# Patient Record
Sex: Male | Born: 1935 | Race: White | Hispanic: No | Marital: Married | Smoking: Never smoker
Health system: Southern US, Community
[De-identification: ages and names within clinical notes are randomized; demographics above are authoritative.]

## PROBLEM LIST (undated history)

## (undated) DIAGNOSIS — K469 Unspecified abdominal hernia without obstruction or gangrene: Secondary | ICD-10-CM

## (undated) DIAGNOSIS — C801 Malignant (primary) neoplasm, unspecified: Secondary | ICD-10-CM

## (undated) HISTORY — DX: Unspecified abdominal hernia without obstruction or gangrene: K46.9

## (undated) HISTORY — PX: CATARACT EXTRACTION, BILATERAL: SHX1313

## (undated) HISTORY — PX: HERNIA REPAIR: SHX51

## (undated) HISTORY — PX: MOHS SURGERY: SUR867

## (undated) HISTORY — DX: Malignant (primary) neoplasm, unspecified: C80.1

---

## 1985-10-12 DIAGNOSIS — K469 Unspecified abdominal hernia without obstruction or gangrene: Secondary | ICD-10-CM

## 1985-10-12 HISTORY — DX: Unspecified abdominal hernia without obstruction or gangrene: K46.9

## 1999-09-19 ENCOUNTER — Encounter: Admission: RE | Admit: 1999-09-19 | Discharge: 1999-09-19 | Payer: Self-pay | Admitting: Family Medicine

## 1999-09-19 ENCOUNTER — Encounter: Payer: Self-pay | Admitting: Family Medicine

## 2004-11-21 ENCOUNTER — Ambulatory Visit: Payer: Self-pay | Admitting: Family Medicine

## 2005-01-15 ENCOUNTER — Ambulatory Visit: Payer: Self-pay | Admitting: Family Medicine

## 2006-09-11 ENCOUNTER — Ambulatory Visit: Payer: Self-pay | Admitting: Family Medicine

## 2006-10-28 ENCOUNTER — Ambulatory Visit: Payer: Self-pay | Admitting: Gastroenterology

## 2006-11-11 ENCOUNTER — Ambulatory Visit: Payer: Self-pay | Admitting: Gastroenterology

## 2006-12-02 ENCOUNTER — Ambulatory Visit: Payer: Self-pay | Admitting: Family Medicine

## 2006-12-02 LAB — CONVERTED CEMR LAB
ALT: 24 units/L (ref 0–40)
AST: 26 units/L (ref 0–37)
Basophils Relative: 0.1 % (ref 0.0–1.0)
Bilirubin, Direct: 0.1 mg/dL (ref 0.0–0.3)
CO2: 34 meq/L — ABNORMAL HIGH (ref 19–32)
Calcium: 9.5 mg/dL (ref 8.4–10.5)
Chloride: 103 meq/L (ref 96–112)
Eosinophils Absolute: 0.2 10*3/uL (ref 0.0–0.6)
Eosinophils Relative: 2.7 % (ref 0.0–5.0)
GFR calc non Af Amer: 88 mL/min
Glucose, Bld: 105 mg/dL — ABNORMAL HIGH (ref 70–99)
HDL: 45.1 mg/dL (ref >39.0)
Platelets: 197 10*3/uL (ref 150–400)
RBC: 4.95 M/uL (ref 4.22–5.81)
RDW: 12.3 % (ref 11.5–14.6)
TSH: 5.45 microintl units/mL (ref 0.35–5.50)
Total CHOL/HDL Ratio: 5.6
Triglycerides: 110 mg/dL (ref 0–149)
VLDL: 22 mg/dL (ref 0–40)
WBC: 7.4 10*3/uL (ref 4.5–10.5)

## 2007-03-14 ENCOUNTER — Ambulatory Visit: Payer: Self-pay | Admitting: Family Medicine

## 2007-03-14 LAB — CONVERTED CEMR LAB
Albumin: 3.7 g/dL (ref 3.5–5.2)
Bilirubin, Direct: 0.2 mg/dL (ref 0.0–0.3)
Cholesterol: 165 mg/dL (ref 0–200)
Total Bilirubin: 1.2 mg/dL (ref 0.3–1.2)
Total CHOL/HDL Ratio: 4.1
Total Protein: 6.4 g/dL (ref 6.0–8.3)
Triglycerides: 64 mg/dL (ref 0–149)

## 2007-05-04 DIAGNOSIS — Z85828 Personal history of other malignant neoplasm of skin: Secondary | ICD-10-CM

## 2007-07-26 ENCOUNTER — Ambulatory Visit: Payer: Self-pay | Admitting: Family Medicine

## 2007-07-26 DIAGNOSIS — M542 Cervicalgia: Secondary | ICD-10-CM

## 2007-08-17 ENCOUNTER — Encounter: Admission: RE | Admit: 2007-08-17 | Discharge: 2007-08-17 | Payer: Self-pay | Admitting: Family Medicine

## 2007-08-17 ENCOUNTER — Ambulatory Visit: Payer: Self-pay | Admitting: Family Medicine

## 2007-08-19 ENCOUNTER — Ambulatory Visit: Payer: Self-pay | Admitting: Family Medicine

## 2007-09-06 DIAGNOSIS — J157 Pneumonia due to Mycoplasma pneumoniae: Secondary | ICD-10-CM

## 2007-12-06 DIAGNOSIS — M5137 Other intervertebral disc degeneration, lumbosacral region: Secondary | ICD-10-CM

## 2007-12-08 ENCOUNTER — Ambulatory Visit: Payer: Self-pay | Admitting: Family Medicine

## 2007-12-12 ENCOUNTER — Emergency Department (HOSPITAL_COMMUNITY): Admission: EM | Admit: 2007-12-12 | Discharge: 2007-12-12 | Payer: Self-pay | Admitting: Emergency Medicine

## 2007-12-13 ENCOUNTER — Ambulatory Visit: Payer: Self-pay | Admitting: Family Medicine

## 2007-12-20 ENCOUNTER — Ambulatory Visit: Payer: Self-pay | Admitting: Family Medicine

## 2007-12-20 DIAGNOSIS — E785 Hyperlipidemia, unspecified: Secondary | ICD-10-CM

## 2008-01-28 ENCOUNTER — Ambulatory Visit: Payer: Self-pay | Admitting: Family Medicine

## 2008-01-28 DIAGNOSIS — IMO0002 Reserved for concepts with insufficient information to code with codable children: Secondary | ICD-10-CM

## 2008-01-31 ENCOUNTER — Ambulatory Visit: Payer: Self-pay | Admitting: Family Medicine

## 2008-03-09 ENCOUNTER — Telehealth: Payer: Self-pay | Admitting: *Deleted

## 2008-03-09 ENCOUNTER — Encounter: Payer: Self-pay | Admitting: *Deleted

## 2008-04-15 ENCOUNTER — Ambulatory Visit (HOSPITAL_COMMUNITY): Admission: EM | Admit: 2008-04-15 | Discharge: 2008-04-15 | Payer: Self-pay | Admitting: Emergency Medicine

## 2008-04-19 ENCOUNTER — Ambulatory Visit (HOSPITAL_COMMUNITY): Admission: RE | Admit: 2008-04-19 | Discharge: 2008-04-19 | Payer: Self-pay | Admitting: Urology

## 2008-06-11 ENCOUNTER — Ambulatory Visit (HOSPITAL_BASED_OUTPATIENT_CLINIC_OR_DEPARTMENT_OTHER): Admission: RE | Admit: 2008-06-11 | Discharge: 2008-06-11 | Payer: Self-pay | Admitting: Urology

## 2008-07-12 ENCOUNTER — Ambulatory Visit: Payer: Self-pay | Admitting: Family Medicine

## 2008-11-12 DIAGNOSIS — IMO0002 Reserved for concepts with insufficient information to code with codable children: Secondary | ICD-10-CM | POA: Insufficient documentation

## 2008-12-04 ENCOUNTER — Ambulatory Visit: Payer: Self-pay | Admitting: Family Medicine

## 2008-12-04 DIAGNOSIS — B351 Tinea unguium: Secondary | ICD-10-CM

## 2008-12-04 DIAGNOSIS — M719 Bursopathy, unspecified: Secondary | ICD-10-CM

## 2008-12-04 DIAGNOSIS — M67919 Unspecified disorder of synovium and tendon, unspecified shoulder: Secondary | ICD-10-CM | POA: Insufficient documentation

## 2009-01-08 ENCOUNTER — Ambulatory Visit: Payer: Self-pay | Admitting: Family Medicine

## 2009-01-08 DIAGNOSIS — J309 Allergic rhinitis, unspecified: Secondary | ICD-10-CM | POA: Insufficient documentation

## 2009-06-04 ENCOUNTER — Ambulatory Visit: Payer: Self-pay | Admitting: Family Medicine

## 2009-06-04 DIAGNOSIS — L255 Unspecified contact dermatitis due to plants, except food: Secondary | ICD-10-CM | POA: Insufficient documentation

## 2009-07-15 ENCOUNTER — Ambulatory Visit: Payer: Self-pay | Admitting: Family Medicine

## 2009-10-10 ENCOUNTER — Ambulatory Visit: Payer: Self-pay | Admitting: Family Medicine

## 2009-10-10 DIAGNOSIS — J069 Acute upper respiratory infection, unspecified: Secondary | ICD-10-CM | POA: Insufficient documentation

## 2010-09-08 ENCOUNTER — Ambulatory Visit: Payer: Self-pay | Admitting: Family Medicine

## 2010-09-08 DIAGNOSIS — R55 Syncope and collapse: Secondary | ICD-10-CM | POA: Insufficient documentation

## 2010-09-24 ENCOUNTER — Ambulatory Visit: Payer: Self-pay | Admitting: Family Medicine

## 2010-09-24 ENCOUNTER — Encounter: Payer: Self-pay | Admitting: Family Medicine

## 2010-09-24 LAB — CONVERTED CEMR LAB
Bilirubin Urine: NEGATIVE
Blood in Urine, dipstick: NEGATIVE
Glucose, Urine, Semiquant: NEGATIVE
Urobilinogen, UA: 0.2
WBC Urine, dipstick: NEGATIVE
pH: 5

## 2010-09-25 LAB — CONVERTED CEMR LAB
AST: 26 units/L (ref 0–37)
Alkaline Phosphatase: 78 units/L (ref 39–117)
BUN: 19 mg/dL (ref 6–23)
Basophils Absolute: 0 10*3/uL (ref 0.0–0.1)
Calcium: 9.3 mg/dL (ref 8.4–10.5)
Cholesterol: 162 mg/dL (ref 0–200)
Creatinine, Ser: 0.9 mg/dL (ref 0.4–1.5)
GFR calc non Af Amer: 83.09 mL/min (ref >60.00)
Glucose, Bld: 91 mg/dL (ref 70–99)
HDL: 47.1 mg/dL (ref >39.00)
Lymphocytes Relative: 25.7 % (ref 12.0–46.0)
Lymphs Abs: 1.6 10*3/uL (ref 0.7–4.0)
Monocytes Relative: 10 % (ref 3.0–12.0)
Neutrophils Relative %: 59.4 % (ref 43.0–77.0)
Platelets: 197 10*3/uL (ref 150.0–400.0)
RDW: 12.8 % (ref 11.5–14.6)
TSH: 5.01 microintl units/mL (ref 0.35–5.50)
Total Bilirubin: 1.1 mg/dL (ref 0.3–1.2)
VLDL: 10.4 mg/dL (ref 0.0–40.0)

## 2010-11-09 LAB — CONVERTED CEMR LAB
AST: 25 units/L (ref 0–37)
Alkaline Phosphatase: 78 units/L (ref 39–117)
Basophils Relative: 1 % (ref 0.0–3.0)
Bilirubin Urine: NEGATIVE
Bilirubin, Direct: 0 mg/dL (ref 0.0–0.3)
CO2: 31 meq/L (ref 19–32)
Calcium: 9.4 mg/dL (ref 8.4–10.5)
Creatinine, Ser: 1 mg/dL (ref 0.4–1.5)
Eosinophils Absolute: 0.3 10*3/uL (ref 0.0–0.7)
GFR calc non Af Amer: 77.61 mL/min (ref >60)
Glucose, Urine, Semiquant: NEGATIVE
HDL: 39.5 mg/dL (ref >39.00)
Hemoglobin: 15.4 g/dL (ref 13.0–17.0)
LDL Cholesterol: 121 mg/dL — ABNORMAL HIGH (ref 0–99)
Lymphocytes Relative: 22.8 % (ref 12.0–46.0)
MCHC: 34.2 g/dL (ref 30.0–36.0)
Monocytes Relative: 12.3 % — ABNORMAL HIGH (ref 3.0–12.0)
Neutrophils Relative %: 58.4 % (ref 43.0–77.0)
Nitrite: NEGATIVE
RBC: 4.66 M/uL (ref 4.22–5.81)
Sodium: 144 meq/L (ref 135–145)
Total CHOL/HDL Ratio: 4
Total Protein: 7.7 g/dL (ref 6.0–8.3)
Triglycerides: 78 mg/dL (ref 0.0–149.0)
Urobilinogen, UA: 0.2
VLDL: 15.6 mg/dL (ref 0.0–40.0)
WBC Urine, dipstick: NEGATIVE
WBC: 4.9 10*3/uL (ref 4.5–10.5)

## 2010-11-13 NOTE — Assessment & Plan Note (Signed)
Summary: pt will come in fasting/njr   Vital Signs:  Patient profile:   77 year old male Height:      69 inches Weight:      154 pounds BMI:     22.82 Temp:     97.7 degrees F oral BP sitting:   120 / 70  (left arm) Cuff size:   regular  Vitals Entered By: Kern Reap CMA Duncan Dull) (September 24, 2010 10:15 AM) CC: cpx---doing ok    CC:  cpx---doing ok.  History of Present Illness: Chad Fisher is a 77 year old, married male, nonsmoker retired, but still working part time at a [employer], [employer], who comes in today for [employer] wellness exam.  Because of a history of underlying hyperlipidemia vitamin B12 deficiency.  His hyperlipidemia.  History with simvastatin 20 mg nightly along with a baby aspirin.  We will check his blood work today.  He also takes 1 cc of vitamin B12 monthly.  Because of B12 deficiency.  Will check labs today.  He gets routine eye care, dental care, colonoscopy, 2008 normal, tetanus, 2004, seasonal flu 2011, Pneumovax 2010, shingles 2008.  He recently had a urologic evaluation because he said a past medical history of kidney stones.  That exam was negative.  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 Here for Medicare AWV:  1.   Risk factors based on Past M, S, F history:reviewed.  No changes except noted above 2.   Physical Activities: walks daily 3.   Depression/mood: good mood.  No depression 4.   Hearing: normal 5.   ADL's: functions independently 6.   Fall Risk: reviewed ..none  identified 7.   Home Safety: no guns   In the house 8.   Height, weight, &visual acuity:height weight, normal.  Vision normal 9.   Counseling: continue good health habits 10.   Labs ordered based on risk factors: done today 11.           Referral Coordination...none indicated 12.           Care Plan...reviewed his medication 13.            Cognitive Assessment .Marland Kitchen.functions independently does all of his own financial work  Allergies: No Known  Drug Allergies  Past History:  Past medical, surgical, family and social histories (including risk factors) reviewed, and no changes noted (except as noted below).  Past Medical History: Reviewed history from 12/20/2007 and no changes required. Skin cancer, hx of right inguinal hernia 1987 pernicious anemia Hyperlipidemia  Past Surgical History: Reviewed history from 05/04/2007 and no changes required. Colonoscopy-11/11/2006 Inguinal herniorrhaphy Mohs Lt Ear  Family History: Reviewed history from 12/20/2007 and no changes required.  father died 2, old age mother died in mid 10s old agetwo brothers, one in good health.  One has BPH  Social History: Reviewed history from 12/20/2007 and no changes required. Never Smoked Alcohol use-no Married Retired Regular exercise-yes  Review of Systems      See HPI  Physical Exam  General:  Well-developed,well-nourished,in no acute distress; alert,appropriate and cooperative throughout examination Head:  Normocephalic and atraumatic without obvious abnormalities. No apparent alopecia or balding. Eyes:  No corneal or conjunctival inflammation noted. EOMI. Perrla. Funduscopic exam benign, without hemorrhages, exudates or papilledema. Vision grossly normal. Ears:  External ear exam shows no significant lesions or deformities.  Otoscopic examination reveals clear canals, tympanic membranes are intact bilaterally without bulging, retraction, inflammation or discharge. Hearing is grossly normal bilaterally.  Nose:  External nasal examination shows no deformity or inflammation. Nasal mucosa are pink and moist without lesions or exudates. Mouth:  Oral mucosa and oropharynx without lesions or exudates.  Teeth in good repair. Neck:  No deformities, masses, or tenderness noted. Chest Wall:  No deformities, masses, tenderness or gynecomastia noted. Breasts:  No masses or gynecomastia noted Lungs:  Normal respiratory effort, chest expands  symmetrically. Lungs are clear to auscultation, no crackles or wheezes. Heart:  Normal rate and regular rhythm. S1 and S2 normal without gallop, murmur, click, rub or other extra sounds. Abdomen:  Bowel sounds positive,abdomen soft and non-tender without masses, organomegaly or hernias noted. Rectal:  No external abnormalities noted. Normal sphincter tone. No rectal masses or tenderness. Genitalia:  Testes bilaterally descended without nodularity, tenderness or masses. No scrotal masses or lesions. No penis lesions or urethral discharge. Prostate:  Prostate gland firm and smooth, no enlargement, nodularity, tenderness, mass, asymmetry or induration. Msk:  No deformity or scoliosis noted of thoracic or lumbar spine.   Pulses:  R and L carotid,radial,femoral,dorsalis pedis and posterior tibial pulses are full and equal bilaterally Extremities:  No clubbing, cyanosis, edema, or deformity noted with normal full range of motion of all joints.   Neurologic:  No cranial nerve deficits noted. Station and gait are normal. Plantar reflexes are down-going bilaterally. DTRs are symmetrical throughout. Sensory, motor and coordinative functions appear intact. Skin:  Intact without suspicious lesions or rashes.......................Marland Kitchenhealing scar, left arm from previous BCe , removal Cervical Nodes:  No lymphadenopathy noted Axillary Nodes:  No palpable lymphadenopathy Inguinal Nodes:  No significant adenopathy Psych:  Cognition and judgment appear intact. Alert and cooperative with normal attention span and concentration. No apparent delusions, illusions, hallucinations   Impression & Recommendations:  Problem # 1:  ALLERGIC RHINITIS (ICD-477.9) Assessment Improved  Orders: Venipuncture (16109) Prescription Created Electronically [phone]) Medicare -1st Annual Wellness Visit [phone]) Urinalysis-dipstick only (Medicare patient) ([id number]) TLB-Lipid Panel (80061-LIPID) TLB-BMP (Basic Metabolic Panel-BMET)  (80048-METABOL) TLB-CBC Platelet - w/Differential (85025-CBCD) TLB-Hepatic/Liver Function Pnl (80076-HEPATIC) TLB-TSH (Thyroid Stimulating Hormone) (84443-TSH) TLB-PSA (Prostate Specific Antigen) (84153-PSA)  Problem # 2:  HYPERLIPIDEMIA (ICD-272.4) Assessment: Improved  His updated medication list for this problem includes:    Simvastatin 20 Mg Tabs (Simvastatin) .Marland Kitchen... Take one tab at bedtime  Orders: Venipuncture (56213) Prescription Created Electronically [phone]) Medicare -1st Annual Wellness Visit [phone]) Urinalysis-dipstick only (Medicare patient) (29528UX) EKG w/ Interpretation (93000) TLB-Lipid Panel (80061-LIPID) TLB-BMP (Basic Metabolic Panel-BMET) (80048-METABOL) TLB-CBC Platelet - w/Differential (85025-CBCD) TLB-Hepatic/Liver Function Pnl (80076-HEPATIC) TLB-TSH (Thyroid Stimulating Hormone) (84443-TSH) TLB-PSA (Prostate Specific Antigen) (84153-PSA)  Problem # 3:  Preventive Health Care (ICD-V70.0) Assessment: Unchanged  Complete Medication List: 1)  Mvi  2)  Adult Aspirin Ec Low Strength 81 Mg Tbec (Aspirin) .... Mon-wed-fri 3)  Simvastatin 20 Mg Tabs (Simvastatin) .... Take one tab at bedtime 4)  Fish Oil Oil (Fish oil) .... Once daily 5)  Cyanocobalamin 1000 Mcg/ml Soln (Cyanocobalamin) .... Use 1ml monthly 6)  Hydromet 5-1.5 Mg/81ml Syrp (Hydrocodone-homatropine) .Marland Kitchen.. 1 or 2 tsps at bedtime as needed  Patient Instructions: 1)  Please schedule a follow-up appointment in 1 year. 2)  It is important that you exercise regularly at least 20 minutes 5 times a week. If you develop chest pain, have severe difficulty breathing, or feel very tired , stop exercising immediately and seek medical attention. 3)  Take an Aspirin every day. Prescriptions: CYANOCOBALAMIN 1000 MCG/ML SOLN (CYANOCOBALAMIN) use 1ml monthly  #30cc x 2   Entered and Authorized by:  Roderick Pee MD   Signed by:   Roderick Pee MD on 09/24/2010   Method used:   Electronically to        [facility]  [phone]* (retail)       [address]       [place], [place]  [address]       Ph: [phone] or [phone]       Fax: [phone]   RxID:   [id number] SIMVASTATIN 20 MG  TABS (SIMVASTATIN) take one tab at bedtime  #100 Each x 3   Entered and Authorized by:   Roderick Pee MD   Signed by:   Roderick Pee MD on 09/24/2010   Method used:   Electronically to        [facility]  [phone]* (retail)       [address]       [place], [place]  [address]       Ph: [phone] or [phone]       Fax: [phone]   RxID:   [id number]    Orders Added: 1)  Venipuncture [28315] 2)  Prescription Created Electronically [phone] 3)  Medicare -1st Annual Wellness Visit [G0438] 4)  Urinalysis-dipstick only (Medicare patient) [81003QW] 5)  EKG w/ Interpretation [93000] 6)  TLB-Lipid Panel [80061-LIPID] 7)  TLB-BMP (Basic Metabolic Panel-BMET) [80048-METABOL] 8)  TLB-CBC Platelet - w/Differential [85025-CBCD] 9)  TLB-Hepatic/Liver Function Pnl [80076-HEPATIC] 10)  TLB-TSH (Thyroid Stimulating Hormone) [84443-TSH] 11)  TLB-PSA (Prostate Specific Antigen) [07371-GGY]       Laboratory Results   Urine Tests    Routine Urinalysis   Color: yellow Appearance: Clear Glucose: negative   (Normal Range: Negative) Bilirubin: negative   (Normal Range: Negative) Ketone: negative   (Normal Range: Negative) Spec. Gravity: 1.025   (Normal Range: 1.003-1.035) Blood: negative   (Normal Range: Negative) pH: 5.0   (Normal Range: 5.0-8.0) Protein: 3+   (Normal Range: Negative) Urobilinogen: 0.2   (Normal Range: 0-1) Nitrite: negative   (Normal Range: Negative) Leukocyte Esterace: negative   (Normal Range: Negative)    Comments: Rita Ohara  September 24, 2010 2:47 PM

## 2010-11-13 NOTE — Assessment & Plan Note (Signed)
Summary: chest congestion//ok per rachel//ccm   Vital Signs:  Patient profile:   77 year old male Weight:      158 pounds Temp:     98.2 degrees F oral BP sitting:   122 / 80  (right arm) Cuff size:   regular  Vitals Entered By: Duard Brady LPN (September 08, 2010 3:16 PM) CC: c/o head congestion , non-productive cough, fainted on friday - hit(R) hip ,still with c/o pain  Is Patient Diabetic? No   CC:  c/o head congestion , non-productive cough, fainted on friday - hit(R) hip , and still with c/o pain .  History of Present Illness: Chad Fisher is a 77 year old male, who comes in today for evaluation of two problems.  For the past week.  He said he congestion, postnasal drip, and nonproductive cough.  No fever.    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  He fell and hit his right hip.  EMS was called and checked his blood pressure pulse, EKG, etc., all of which were normal.  He declined a trip to the emergency room.  He's never had a syncopal episode like this in the past.  The only new medication.  He took was antihistamine decongestant type tablet the night before.  Preventive Screening-Counseling & Management  Alcohol-Tobacco     Smoking Status: never  Allergies (verified): No Known Drug Allergies  Past History:  Past medical, surgical, family and social histories (including risk factors) reviewed for relevance to current acute and chronic problems.  Past Medical History: Reviewed history from 12/20/2007 and no changes required. Skin cancer, hx of right inguinal hernia 1987 pernicious anemia Hyperlipidemia  Past Surgical History: Reviewed history from 05/04/2007 and no changes required. Colonoscopy-11/11/2006 Inguinal herniorrhaphy Mohs Lt Ear  Family History: Reviewed history from 12/20/2007 and no changes required.  father died 86, old age mother died in mid 51s old  agetwo brothers, one in good health.  One has BPH  Social History: Reviewed history from 12/20/2007 and no changes required. Never Smoked Alcohol use-no Married Retired Regular exercise-yes  Review of Systems      See HPI  Physical Exam  General:  Well-developed,well-nourished,in no acute distress; alert,appropriate and cooperative throughout examination Head:  Normocephalic and atraumatic without obvious abnormalities. No apparent alopecia or balding. Eyes:  No corneal or conjunctival inflammation noted. EOMI. Perrla. Funduscopic exam benign, without hemorrhages, exudates or papilledema. Vision grossly normal. Ears:  External ear exam shows no significant lesions or deformities.  Otoscopic examination reveals clear canals, tympanic membranes are intact bilaterally without bulging, retraction, inflammation or discharge. Hearing is grossly normal bilaterally. Nose:  External nasal examination shows no deformity or inflammation. Nasal mucosa are pink and moist without lesions or exudates. Mouth:  Oral mucosa and oropharynx without lesions or exudates.  Teeth in good repair. Neck:  No deformities, masses, or tenderness noted. Lungs:  Normal respiratory effort, chest expands symmetrically. Lungs are clear to auscultation, no crackles or wheezes. Msk:  normal except for superficial bruise right hip Neurologic:  No cranial nerve deficits noted. Station and gait are normal. Plantar reflexes are down-going bilaterally. DTRs are symmetrical throughout. Sensory, motor and coordinative functions appear intact.   Problems:  Medical Problems Added: 1)  Dx of Syncope  (ICD-780.2)  Impression & Recommendations:  Problem # 1:  VIRAL URI (ICD-465.9) Assessment Deteriorated  His updated medication list for this problem includes:  Adult Aspirin Ec Low Strength 81 Mg Tbec (Aspirin) ..... Mon-wed-fri    Hydromet 5-1.5 Mg/32ml Syrp (Hydrocodone-homatropine) .Marland Kitchen... 1 or 2 tsps at bedtime as  needed  Problem # 2:  SYNCOPE (ICD-780.2) Assessment: New  Complete Medication List: 1)  Mvi  2)  Adult Aspirin Ec Low Strength 81 Mg Tbec (Aspirin) .... Mon-wed-fri 3)  Simvastatin 20 Mg Tabs (Simvastatin) .... Take one tab at bedtime 4)  Fish Oil Oil (Fish oil) .... Once daily 5)  Cyanocobalamin 1000 Mcg/ml Soln (Cyanocobalamin) .... Use 1ml monthly 6)  Hydromet 5-1.5 Mg/58ml Syrp (Hydrocodone-homatropine) .Marland Kitchen.. 1 or 2 tsps at bedtime as needed  Patient Instructions: 1)  do the usual things, you know for a viral infection also, Hydromet one half to 1 teaspoon at bedtime as needed for nighttime cough. 2)  Did not take any over-the-counter antihistamine, decongestants. 3)  In the future.  Do not sit on tall  stools, w y  legs dangling, and if you do ever feel like her lightheaded like down on the floor and put y  feet up in the air   Orders Added: 1)  Est. Patient Level IV [16109]   Immunization History:  Tetanus/Td Immunization History:    Tetanus/Td:  Historical (10/12/2002)  Influenza Immunization History:    Influenza:  Historical (07/29/2010)  Pneumovax Immunization History:    Pneumovax:  Pneumovax (07/15/2009)  Zostavax History:    Zostavax # 1:  Zostavax (08/19/2007)   Immunization History:  Influenza Immunization History:    Influenza:  Historical (07/29/2010)

## 2010-12-10 ENCOUNTER — Emergency Department (HOSPITAL_COMMUNITY): Payer: Medicare Other

## 2010-12-10 ENCOUNTER — Emergency Department (HOSPITAL_COMMUNITY)
Admission: EM | Admit: 2010-12-10 | Discharge: 2010-12-11 | Disposition: A | Discharge status: Home / Self Care | Payer: Medicare Other | Attending: Emergency Medicine | Admitting: Emergency Medicine

## 2010-12-10 ENCOUNTER — Encounter (HOSPITAL_COMMUNITY): Payer: Self-pay

## 2010-12-10 DIAGNOSIS — Z79899 Other long term (current) drug therapy: Secondary | ICD-10-CM | POA: Insufficient documentation

## 2010-12-10 DIAGNOSIS — Z87442 Personal history of urinary calculi: Secondary | ICD-10-CM | POA: Insufficient documentation

## 2010-12-10 DIAGNOSIS — E7800 Pure hypercholesterolemia, unspecified: Secondary | ICD-10-CM | POA: Insufficient documentation

## 2010-12-10 DIAGNOSIS — N200 Calculus of kidney: Secondary | ICD-10-CM | POA: Insufficient documentation

## 2010-12-10 DIAGNOSIS — Z7982 Long term (current) use of aspirin: Secondary | ICD-10-CM | POA: Insufficient documentation

## 2010-12-10 LAB — URINE MICROSCOPIC-ADD ON

## 2010-12-10 LAB — DIFFERENTIAL
Basophils Relative: 0 % (ref 0–1)
Eosinophils Absolute: 0.1 10*3/uL (ref 0.0–0.7)
Eosinophils Relative: 1 % (ref 0–5)
Monocytes Absolute: 0.8 10*3/uL (ref 0.1–1.0)
Monocytes Relative: 7 % (ref 3–12)
Neutrophils Relative %: 83 % — ABNORMAL HIGH (ref 43–77)

## 2010-12-10 LAB — BASIC METABOLIC PANEL
Calcium: 9.4 mg/dL (ref 8.4–10.5)
Creatinine, Ser: 1.46 mg/dL (ref 0.4–1.5)
GFR calc Af Amer: 57 mL/min — ABNORMAL LOW (ref >60)

## 2010-12-10 LAB — CBC
MCH: 32.1 pg (ref 26.0–34.0)
MCHC: 35.2 g/dL (ref 30.0–36.0)
Platelets: 145 10*3/uL — ABNORMAL LOW (ref 150–400)
RBC: 4.67 MIL/uL (ref 4.22–5.81)
RDW: 12.3 % (ref 11.5–15.5)

## 2010-12-10 LAB — URINALYSIS, ROUTINE W REFLEX MICROSCOPIC
Specific Gravity, Urine: 1.025 (ref 1.005–1.030)
Urobilinogen, UA: 0.2 mg/dL (ref 0.0–1.0)

## 2010-12-12 LAB — URINE CULTURE

## 2010-12-16 ENCOUNTER — Ambulatory Visit (HOSPITAL_COMMUNITY)
Admission: RE | Admit: 2010-12-16 | Discharge: 2010-12-16 | Disposition: A | Discharge status: Home / Self Care | Payer: Medicare Other | Source: Ambulatory Visit | Attending: Urology | Admitting: Urology

## 2010-12-16 DIAGNOSIS — N201 Calculus of ureter: Secondary | ICD-10-CM | POA: Insufficient documentation

## 2010-12-16 DIAGNOSIS — R209 Unspecified disturbances of skin sensation: Secondary | ICD-10-CM | POA: Insufficient documentation

## 2010-12-16 DIAGNOSIS — Z79899 Other long term (current) drug therapy: Secondary | ICD-10-CM | POA: Insufficient documentation

## 2010-12-16 DIAGNOSIS — N1330 Unspecified hydronephrosis: Secondary | ICD-10-CM | POA: Insufficient documentation

## 2010-12-16 DIAGNOSIS — Z01812 Encounter for preprocedural laboratory examination: Secondary | ICD-10-CM | POA: Insufficient documentation

## 2010-12-16 DIAGNOSIS — D510 Vitamin B12 deficiency anemia due to intrinsic factor deficiency: Secondary | ICD-10-CM | POA: Insufficient documentation

## 2010-12-16 LAB — SURGICAL PCR SCREEN: MRSA, PCR: NEGATIVE

## 2010-12-24 NOTE — Op Note (Signed)
NAME:  Chad Fisher, PRILL NO.:  [id number]  MEDICAL RECORD NO.:  [id number]           PATIENT TYPE:  O  LOCATION:  DAYL                         FACILITY:  [facility]  PHYSICIAN:  Bertram Millard. Yamilett Anastos, M.D.DATE OF BIRTH:  1932/10/15  DATE OF PROCEDURE:  12/16/2010 DATE OF DISCHARGE:                              OPERATIVE REPORT   PREOPERATIVE DIAGNOSIS:  A 12-mm left proximal ureteral stone.  POSTOPERATIVE DIAGNOSES:  A 12-mm left proximal ureteral stone.  PRINCIPAL PROCEDURES: 1. Cystoscopy. 2. Left retrograde ureteral pyelogram. 3. Left ureteroscopy, rigid and flexible, with holmium laser and     extraction of stone fragments, double-J stent placed on the left (6-     French x 24 cm contour with string on).  SURGEON:  Bertram Millard. Dao Mearns, M.D.  ANESTHESIA:  General LMA.  COMPLICATIONS:  None.  BRIEF HISTORY:  This nice 77 year old gentleman presented recently with persistent stone, even after passing a 4-mm distal ureteral stone last week.  Evaluation yesterday revealed a 12-mm stone in the proximal left ureter with hydronephrosis.  As the patient was significantly symptomatic and had a large proximal stone, it was recommended that he undergo ureteroscopy with holmium laser and extraction of stone.  The stone was very radiolucent and could not be seen on plain KUB.  Risks and complications of the procedure had been discussed with and are known by the patient from prior procedure. He understands these and desires to proceed.  DESCRIPTION OF PROCEDURE:  The patient was identified and marked in the preoperative area.  He received preoperative IV antibiotics and was taken to the operating room where general anesthetic was administered using LMA.  He was placed in the dorsal lithotomy position.  His genitalia and perineum were prepped and draped.  Time-out was then called.  The procedure then commenced.  A 22-French panendoscope was advanced through his  urethra and his bladder which was normal.  The left ureteral orifice was somewhat erythematous from recent stone passage.  A retrograde was performed which showed a totally normal distal and mid ureter.  In the proximal ureter, there was a filling defect approximately 12 mm long and 7 mm wide.  This was consistent with the stone.  There was proximal hydronephrosis.  I then passed a guidewire past the stone and then removed the cystoscope.  A rigid ureteroscope was passed up to the stone which was then in the renal pelvis at this point.  I was unable to treat it with the laser.  I then, through an access sheath, placed a digital ureteroscope which easily encountered the stone.  Pictures were taken. I passed 200-micron fiber and fragmented the stone into multiple smaller fragments.  These were then grasped, as many as I could see, with a nitinol basket and extracted.  There were obviously some small fragments left, these were left to pass with and after the stent was removed. After removing all of the stone fragments I could see, I then passed a 24 cm x 6-French contour stent over a guidewire which was placed through the access sheath.  The sheath had been removed.  At this point, with good proximal and distal curl seen, the bladder was drained.  The string was left on the stent and brought through the urethra and then taped to the penis.  The patient tolerated the procedure well.  He was awakened and extubated and taken to PACU in stable condition.  He will follow up in a couple of weeks.  He is told to remove the stent by pulling the string in approximately 7 days.  He was discharged home on 7 days of Bactrim DS 1 p.o. daily as well as Urbil.  He has oxycodone at home.     Bertram Millard. Retta Diones, M.D.     SMD/MEDQ  D:  12/16/2010  T:  12/16/2010  Job:  [id number]  Electronically Signed by Marcine Matar M.D. on 12/24/2010 10:32:53 AM

## 2010-12-30 ENCOUNTER — Other Ambulatory Visit: Payer: Self-pay | Admitting: *Deleted

## 2010-12-30 MED ORDER — SIMVASTATIN 20 MG PO TABS: 20.0000 mg | ORAL_TABLET | Freq: Every evening | ORAL | Status: DC

## 2011-02-24 NOTE — Op Note (Signed)
NAME:  Chad Fisher, Chad Fisher NO.:  [id number]   MEDICAL RECORD NO.:  [id number]          PATIENT TYPE:  EMS   LOCATION:  ED                           FACILITY:  [facility]   PHYSICIAN:  Bertram Millard. Dahlstedt, M.D.DATE OF BIRTH:  [birth year]   DATE OF PROCEDURE:  04/15/2008  DATE OF DISCHARGE:                               OPERATIVE REPORT   PREOPERATIVE DIAGNOSIS:  Obstructing left UPJ (ureteropelvic junction)  stone, 9 mm.   POSTOPERATIVE DIAGNOSIS:  Obstructing left UPJ (ureteropelvic junction)  stone, 9 mm.   PROCEDURE:  Cysto, left double J stent placement.   SURGEON:  Bertram Millard. Dahlstedt, M.D.   ANESTHESIA:  General with LMA.   COMPLICATIONS:  None.   BRIEF HISTORY:  A 77 year old male with a prior history of stones.  I  last saw him approximately 4 years ago.  At that time he had a 5 x 7 mm  left renal stone.  He has not been followed since that time as he has  had inactive disease.  Approximately 5 days ago, while [place] the beach at  [facility], he began experiencing left flank pain.  This worsened and he  went to the emergency room in [place].  He was found to have a left  UPJ stone 9 mm in size.  He was placed on Percocet and had followup to  see me tomorrow.  The patient had worsened pain today, had significant  nausea and the pain was not taken care of by Percocet 10 mg tablets.  He  presented to the emergency room.  He was treated with narcotics but  still had a fair amount of pain.  As he has a persistent left UPJ stone,  9 mm in size, it was recommended that he have a stent placed with  eventual lithotripsy performed.  Other options were discussed with the  patient.  He understands these and desires to proceed.   DESCRIPTION OF PROCEDURE:  The patient was identified in the holding  area and the surgical site marked.  He was administered a gram of Ancef.  He was taken to the operating room where general anesthetic was  administered using the LMA.  He was  placed in the dorsal lithotomy  position.  Genitalia and perineum were prepped and draped.  A 22 French  panendoscope was passed under direct vision through his urethra which  was unremarkable.  Prostate was not obstructed.  The bladder was entered  and inspected circumferentially.  No tumors, trabeculations or foreign  bodies were noted.  A sensor tip guidewire was advanced under  fluoroscopic guidance with a minimal amount of difficulty navigating by  the left UPJ stone.  Once the stone had been passed a 6 French x 24 cm  contour double J stent was placed.  It was somewhat difficult to pass  this, but good curls were seen proximally and distally once the  guidewire was removed.  The bladder was drained and the procedure  terminated.   The patient tolerated the procedure well.  He was awakened and taken to  the PACU  in stable condition.  He was discharged on the remaining  Percocet 10/325, as well as 78 Ural tablets one p.o. q.6 hours p.r.n.  My office will call him and set up an appointment for eventual  lithotripsy, hopefully later this week.      Bertram Millard. Dahlstedt, M.D.  Electronically Signed     SMD/MEDQ  D:  04/15/2008  T:  04/15/2008  Job:  [id number]   cc:   Tinnie Gens A. Tawanna Cooler, MD  [address] [place]  [place] [address]

## 2011-02-24 NOTE — Op Note (Signed)
NAME:  Chad Fisher, Chad Fisher NO.:  [id number]   MEDICAL RECORD NO.:  [id number]          PATIENT TYPE:  AMB   LOCATION:  [facility]                         FACILITY:  [facility]   PHYSICIAN:  Bertram Millard. Dahlstedt, M.D.DATE OF BIRTH:  Dec 04, 1932   DATE OF PROCEDURE:  06/11/2008  DATE OF DISCHARGE:                               OPERATIVE REPORT   PREOPERATIVE DIAGNOSIS:  Left ureteral nephrolithiasis.   POSTOPERATIVE DIAGNOSIS:  Left ureteral nephrolithiasis.   PROCEDURES:  1. Cystourethroscopy.  2. Left ureteral stent removal.  3. Left ureteroscopic stone manipulation with laser lithotripsy.  4. Intraoperative fluoroscopy with interpretation.   ATTENDING PHYSICIAN:  Bertram Millard. Dahlstedt, M.D.   RESIDENT PHYSICIAN:  Dr. Delman Kitten.   ANESTHESIA:  General.   INDICATIONS FOR PROCEDURE:  Mr. Dettman is a 77 year old white male with  past medical history positive for left-sided nephroureterolithiasis.  He  had a large left UPJ stone and underwent ESWL for this which had  adequate fragmentation but he still had sizable stone burden, mostly in  his left lower pole.  He did have a stent placed in the past as well.  In light of his retained stone burden and continued symptoms, he was  recommended for ureteroscopic stone manipulation.  Preoperatively,  informed consent was obtained after discussion was undertaken regarding  risks, benefits, consequences and concerns.   PROCEDURE IN DETAIL:  The patient was brought to the operating room,  placed in supine position.  He was correctly identified by his wristband  and appropriate time-out was taken.  IV antibiotics were administered.  General anesthesia was delivered.  Once adequately anesthetized, he was  placed in the dorsal lithotomy position.  Great care taken to minimize  the risk of peripheral neuropathy or compartment syndrome.  His perineum  was prepped and draped sterilely.  We began our procedure by performing  rigid  cystourethroscopy with a 22-French rigid cystoscopic sheath and 12  degree lens, normal saline as our irrigant.  Urethra was normal in its  course and caliber.  Upon entering the bladder, clear urine was  identified.  Left ureteral stent was noted.  There were no distal  encrustations.  Fluoroscopy did not demonstrate any proximal  encrustations either.  The stone was grasped and externalized per  urethra.  The cystoscope guidewire was advanced through the stent into  the left upper pole collecting system guided by fluoroscopy.  We then  used the semi-rigid ureteroscope and performed left ureteroscopy which  demonstrated a widely patent left ureter.  He had an approximately 4 mm  stone in the proximal ureter that was grasped with a nitinol basket and  externalized without any resistance or difficulty.  The rest of his  ureter was clean from stone.  Likewise, over the guidewire, we advanced  a 12 x 14 ureteral access sheath without significant resistance.  This  was done with fluoroscopic guidance as well.  The inner sheath was  removed.  We then performed a left pyeloureteroscopy.  This was done  with the digital ureteroscope.  Pan pyeloscopy demonstrated stone  fragments mostly in the  middle pole collecting system.  Most of these  were able to be basketed without any difficulty, one was larger and  could not be navigated through the UPJ; likewise, it was left in the  renal pelvis and we used a 260 nanometer laser fiber with settings of  0.5 joules and 5 Hz to further fragment it.  All larger fragments were  then removed with a nitinol basket.  The remaining stone debris was  dust.  Of note, the left lower pole stones seen on preoperative KUB and  intraoperative fluoroscopy could not be visualized during ureteroscopy.  We searched for this for a great deal of time.  We even performed  contrast pyelography which did not demonstrate possible unseen calyces.  At one point, we also used the  laser fiber to try to unroof a papilla  which appeared to be possibly hiding a suburothelial stone, but this was  unsuccessful.  Subsequently, with all identifiable stone material  removed, we elected to terminate our procedure.  The flexible  ureteroscope and access sheath were then backed out of the ureter and  urethra in antegrade fashion, inspecting all structures upon its exit  and there was no injury appreciated.  No ureteral stones were seen.  The  bladder was then drained and this marked the end of our procedure.  He  awoke from anesthesia and was taken to recovery room in stable  condition.  He tolerated the procedure well.  There were no  complications.  Dr. Retta Diones was present and participated in all  aspects of the case.     ______________________________  Delman Kitten, M.D.      Bertram Millard. Dahlstedt, M.D.  Electronically Signed    DW/MEDQ  D:  06/11/2008  T:  06/11/2008  Job:  [id number]

## 2011-02-27 NOTE — Assessment & Plan Note (Signed)
[facility]                                 ON-CALL NOTE   NAME:Vigorito, Chad Fisher                        MRN:          [id number]  DATE:09/11/2006                            DOB:          02-May-1933    DATE OF CALL:  September 11, 2006.  Phone [phone].   SUBJECTIVE:  The patient hit right side of ribs on a truck bed and is  having some soreness.  This is becoming worse.   ASSESSMENT/PLAN:  [facility].     Kerby Nora, MD  Electronically Signed    AB/MedQ  DD: 09/15/2006  DT: 09/16/2006  Job #: [id number]

## 2011-04-13 ENCOUNTER — Other Ambulatory Visit: Payer: Self-pay | Admitting: *Deleted

## 2011-04-13 MED ORDER — SIMVASTATIN 20 MG PO TABS: 20.0000 mg | ORAL_TABLET | Freq: Every evening | ORAL | Status: DC

## 2011-04-22 ENCOUNTER — Encounter (HOSPITAL_COMMUNITY): Payer: Self-pay

## 2011-04-22 ENCOUNTER — Emergency Department (HOSPITAL_COMMUNITY)
Admission: EM | Admit: 2011-04-22 | Discharge: 2011-04-22 | Disposition: A | Discharge status: Home / Self Care | Payer: Medicare Other | Attending: Emergency Medicine | Admitting: Emergency Medicine

## 2011-04-22 ENCOUNTER — Emergency Department (HOSPITAL_COMMUNITY): Payer: Medicare Other

## 2011-04-22 DIAGNOSIS — D510 Vitamin B12 deficiency anemia due to intrinsic factor deficiency: Secondary | ICD-10-CM | POA: Insufficient documentation

## 2011-04-22 DIAGNOSIS — N509 Disorder of male genital organs, unspecified: Secondary | ICD-10-CM | POA: Insufficient documentation

## 2011-04-22 DIAGNOSIS — Z87442 Personal history of urinary calculi: Secondary | ICD-10-CM | POA: Insufficient documentation

## 2011-04-22 DIAGNOSIS — R109 Unspecified abdominal pain: Secondary | ICD-10-CM | POA: Insufficient documentation

## 2011-04-22 DIAGNOSIS — E7800 Pure hypercholesterolemia, unspecified: Secondary | ICD-10-CM | POA: Insufficient documentation

## 2011-04-22 DIAGNOSIS — N200 Calculus of kidney: Secondary | ICD-10-CM | POA: Insufficient documentation

## 2011-04-22 DIAGNOSIS — R112 Nausea with vomiting, unspecified: Secondary | ICD-10-CM | POA: Insufficient documentation

## 2011-04-22 DIAGNOSIS — N201 Calculus of ureter: Secondary | ICD-10-CM | POA: Insufficient documentation

## 2011-04-22 LAB — URINALYSIS, ROUTINE W REFLEX MICROSCOPIC
Glucose, UA: NEGATIVE mg/dL
Leukocytes, UA: NEGATIVE
Nitrite: NEGATIVE
Protein, ur: NEGATIVE mg/dL
pH: 5.5 (ref 5.0–8.0)

## 2011-04-22 LAB — URINE MICROSCOPIC-ADD ON

## 2011-04-23 LAB — URINE CULTURE: Colony Count: NO GROWTH

## 2011-04-28 ENCOUNTER — Ambulatory Visit (INDEPENDENT_AMBULATORY_CARE_PROVIDER_SITE_OTHER): Payer: Medicare Other | Admitting: Urology

## 2011-04-28 DIAGNOSIS — N200 Calculus of kidney: Secondary | ICD-10-CM

## 2011-07-09 LAB — URINALYSIS, ROUTINE W REFLEX MICROSCOPIC
Glucose, UA: NEGATIVE
Leukocytes, UA: NEGATIVE
Protein, ur: 100 — AB
Specific Gravity, Urine: 1.027
pH: 5.5

## 2011-07-09 LAB — DIFFERENTIAL
Eosinophils Absolute: 0.1
Lymphocytes Relative: 8 — ABNORMAL LOW
Lymphs Abs: 0.8
Monocytes Relative: 8
Neutrophils Relative %: 83 — ABNORMAL HIGH

## 2011-07-09 LAB — POCT I-STAT, CHEM 8
Calcium, Ion: 1.05 — ABNORMAL LOW
HCT: 43
Hemoglobin: 14.6
Sodium: 137
TCO2: 29

## 2011-07-09 LAB — CBC
MCV: 94.1
RBC: 4.59
WBC: 9.7

## 2011-07-09 LAB — URINE MICROSCOPIC-ADD ON

## 2011-10-26 ENCOUNTER — Telehealth: Payer: Self-pay | Admitting: Family Medicine

## 2011-10-26 NOTE — Telephone Encounter (Signed)
Refill Simvastatin 20mg  1qd to [facility]. Thanks.

## 2011-10-27 ENCOUNTER — Other Ambulatory Visit: Payer: Self-pay | Admitting: *Deleted

## 2011-10-27 MED ORDER — SIMVASTATIN 20 MG PO TABS: 20.0000 mg | ORAL_TABLET | Freq: Every evening | ORAL | Status: DC

## 2011-10-27 NOTE — Telephone Encounter (Signed)
rx sent

## 2011-10-31 ENCOUNTER — Encounter: Payer: Self-pay | Admitting: Family Medicine

## 2011-11-03 ENCOUNTER — Ambulatory Visit (INDEPENDENT_AMBULATORY_CARE_PROVIDER_SITE_OTHER): Payer: Medicare Other | Admitting: Family Medicine

## 2011-11-03 ENCOUNTER — Encounter: Payer: Self-pay | Admitting: Family Medicine

## 2011-11-03 DIAGNOSIS — D510 Vitamin B12 deficiency anemia due to intrinsic factor deficiency: Secondary | ICD-10-CM

## 2011-11-03 DIAGNOSIS — Z136 Encounter for screening for cardiovascular disorders: Secondary | ICD-10-CM

## 2011-11-03 DIAGNOSIS — J309 Allergic rhinitis, unspecified: Secondary | ICD-10-CM

## 2011-11-03 DIAGNOSIS — Z85828 Personal history of other malignant neoplasm of skin: Secondary | ICD-10-CM

## 2011-11-03 DIAGNOSIS — Z0000 Encounter for general adult medical examination without abnormal findings: Secondary | ICD-10-CM

## 2011-11-03 DIAGNOSIS — E785 Hyperlipidemia, unspecified: Secondary | ICD-10-CM

## 2011-11-03 DIAGNOSIS — Z125 Encounter for screening for malignant neoplasm of prostate: Secondary | ICD-10-CM

## 2011-11-03 LAB — BASIC METABOLIC PANEL
BUN: 20 mg/dL (ref 6–23)
Calcium: 9.2 mg/dL (ref 8.4–10.5)
Creatinine, Ser: 1 mg/dL (ref 0.4–1.5)
GFR: 78.95 mL/min (ref >60.00)
Glucose, Bld: 94 mg/dL (ref 70–99)
Sodium: 140 mEq/L (ref 135–145)

## 2011-11-03 LAB — LIPID PANEL
HDL: 51.5 mg/dL (ref >39.00)
Total CHOL/HDL Ratio: 3
VLDL: 10.2 mg/dL (ref 0.0–40.0)

## 2011-11-03 LAB — CBC WITH DIFFERENTIAL/PLATELET
Basophils Relative: 0.8 % (ref 0.0–3.0)
Eosinophils Absolute: 0.4 10*3/uL (ref 0.0–0.7)
Eosinophils Relative: 6.7 % — ABNORMAL HIGH (ref 0.0–5.0)
HCT: 43.4 % (ref 39.0–52.0)
Lymphs Abs: 1.2 10*3/uL (ref 0.7–4.0)
MCHC: 34 g/dL (ref 30.0–36.0)
MCV: 95.4 fl (ref 78.0–100.0)
Monocytes Absolute: 0.5 10*3/uL (ref 0.1–1.0)
Neutro Abs: 3.4 10*3/uL (ref 1.4–7.7)
RBC: 4.55 Mil/uL (ref 4.22–5.81)
WBC: 5.6 10*3/uL (ref 4.5–10.5)

## 2011-11-03 LAB — POCT URINALYSIS DIPSTICK
Blood, UA: NEGATIVE
Ketones, UA: NEGATIVE
Protein, UA: NEGATIVE
Spec Grav, UA: 1.025
pH, UA: 5

## 2011-11-03 LAB — TSH: TSH: 7.79 u[IU]/mL — ABNORMAL HIGH (ref 0.35–5.50)

## 2011-11-03 LAB — HEPATIC FUNCTION PANEL
Bilirubin, Direct: 0.1 mg/dL (ref 0.0–0.3)
Total Bilirubin: 1 mg/dL (ref 0.3–1.2)

## 2011-11-03 MED ORDER — CYANOCOBALAMIN 1000 MCG/ML IJ SOLN: 1000.0000 ug | INTRAMUSCULAR | Status: DC

## 2011-11-03 MED ORDER — SIMVASTATIN 20 MG PO TABS: 20.0000 mg | ORAL_TABLET | Freq: Every evening | ORAL | Status: DC

## 2011-11-03 NOTE — Patient Instructions (Signed)
Continue your current medications.  Follow-up in one year and sooner if any problems

## 2011-11-03 NOTE — Progress Notes (Signed)
  Subjective:    Patient ID: Chad Fisher, male    DOB: April 02, 1933, 78 y.o.   MRN: [id number]  HPI Chad Fisher is a 78 year old, married male, nonsmoker........ Still working 3 days a week....... Who comes in today for Medicare wellness examination because of a history of hyperlipidemia, and B12 deficiency, and kidney stones.  His hyperlipidemia is treated with simvastatin 20 mg daily and an aspirin tablet.  Check lipid panel today.  He sees his urologist on a regular basis.  He had two kidney stones.  Last year that required surgical intervention.  He is on potassium citrate two tabs daily.  He taken B12 1 cc monthly for the past 20 years because of a history of B12 deficiency.  He gets routine eye care, hearing normal, regular dental care, colonoscopy, normal, seasonal flu shot 2012, Pneumovax, x 2, tetanus, 2004, shingles 2008  His cognitive functions normal.  He continues to work 3 days per week.  He walks on a regular basis.  Home Chad Fisher.  He did notice is identified.  No guns in the house and he does have a health care power-of-attorney and a living will.   Review of Systems  Constitutional: Negative.   HENT: Negative.   Eyes: Negative.   Respiratory: Negative.   Cardiovascular: Negative.   Gastrointestinal: Negative.   Genitourinary: Negative.   Musculoskeletal: Negative.   Skin: Negative.   Neurological: Negative.   Hematological: Negative.   Psychiatric/Behavioral: Negative.        Objective:   Physical Exam  Constitutional: He is oriented to person, place, and time. He appears well-developed and well-nourished.  HENT:  Head: Normocephalic and atraumatic.  Right Ear: External ear normal.  Left Ear: External ear normal.  Nose: Nose normal.  Mouth/Throat: Oropharynx is clear and moist.  Eyes: Conjunctivae and EOM are normal. Pupils are equal, round, and reactive to light.  Neck: Normal range of motion. Neck supple. No JVD present. No tracheal deviation present. No  thyromegaly present.  Cardiovascular: Normal rate, regular rhythm, normal heart sounds and intact distal pulses.  Exam reveals no gallop and no friction rub.   No murmur heard. Pulmonary/Chest: Effort normal and breath sounds normal. No stridor. No respiratory distress. He has no wheezes. He has no rales. He exhibits no tenderness.  Abdominal: Soft. Bowel sounds are normal. He exhibits no distension and no mass. There is no tenderness. There is no rebound and no guarding.  Genitourinary: Rectum normal, prostate normal and penis normal. Guaiac negative stool. No penile tenderness.  Musculoskeletal: Normal range of motion. He exhibits no edema and no tenderness.  Lymphadenopathy:    He has no cervical adenopathy.  Neurological: He is alert and oriented to person, place, and time. He has normal reflexes. No cranial nerve deficit. He exhibits normal muscle tone.  Skin: Skin is warm and dry. No rash noted. No erythema. No pallor.       Total body skin exam he has slight scant light.  Eyes and sun damaged of his face otherwise no abnormal appearing lesion  Psychiatric: He has a normal mood and affect. His behavior is normal. Judgment and thought content normal.          Assessment & Plan:  Healthy male.  Hyperlipidemia.  Continue Zocor 20 mg daily, and an aspirin tablet.  B12 deficiency continue B12 1 cc monthly.  History of kidney stones.  Continue potassium citrate followed by urology

## 2011-12-07 ENCOUNTER — Other Ambulatory Visit (INDEPENDENT_AMBULATORY_CARE_PROVIDER_SITE_OTHER): Payer: Medicare Other

## 2011-12-07 DIAGNOSIS — E039 Hypothyroidism, unspecified: Secondary | ICD-10-CM

## 2011-12-14 ENCOUNTER — Other Ambulatory Visit: Payer: Self-pay | Admitting: *Deleted

## 2011-12-14 MED ORDER — LEVOTHYROXINE SODIUM 25 MCG PO TABS: 25.0000 ug | ORAL_TABLET | Freq: Every day | ORAL | Status: DC

## 2012-01-12 ENCOUNTER — Encounter: Payer: Medicare Other | Admitting: Family Medicine

## 2012-02-10 ENCOUNTER — Telehealth: Payer: Self-pay | Admitting: Family Medicine

## 2012-02-10 NOTE — Telephone Encounter (Signed)
Appointment made

## 2012-02-10 NOTE — Telephone Encounter (Signed)
Patient called stating that he was supposed to have his tsh checked in two months and is due now but there is no order. Please advise as he would like to come in on Monday for this lab.

## 2012-02-15 ENCOUNTER — Other Ambulatory Visit (INDEPENDENT_AMBULATORY_CARE_PROVIDER_SITE_OTHER): Payer: Medicare Other

## 2012-02-15 DIAGNOSIS — E039 Hypothyroidism, unspecified: Secondary | ICD-10-CM

## 2012-02-15 LAB — TSH: TSH: 5.37 u[IU]/mL (ref 0.35–5.50)

## 2012-07-01 ENCOUNTER — Telehealth: Payer: Self-pay | Admitting: Family Medicine

## 2012-07-01 DIAGNOSIS — D510 Vitamin B12 deficiency anemia due to intrinsic factor deficiency: Secondary | ICD-10-CM

## 2012-07-01 MED ORDER — CYANOCOBALAMIN 1000 MCG/ML IJ SOLN: 1000.0000 ug | INTRAMUSCULAR | Status: DC

## 2012-07-01 NOTE — Telephone Encounter (Signed)
Patient came in stating that he would like a written rx for his cyanocobalam so that he may find it cheaper at a different pharmacy as his regular pharmacy has it on back order. Please assist.

## 2012-07-01 NOTE — Telephone Encounter (Signed)
rx ready for pick up. Left message on machine for patient. 

## 2012-12-02 ENCOUNTER — Other Ambulatory Visit: Payer: Self-pay | Admitting: Family Medicine

## 2012-12-29 ENCOUNTER — Other Ambulatory Visit: Payer: Self-pay | Admitting: Family Medicine

## 2013-04-13 ENCOUNTER — Encounter: Payer: Medicare Other | Admitting: Family Medicine

## 2013-05-02 ENCOUNTER — Encounter: Payer: Medicare Other | Admitting: Family Medicine

## 2013-05-24 ENCOUNTER — Encounter: Payer: Medicare Other | Admitting: Family Medicine

## 2013-07-24 ENCOUNTER — Other Ambulatory Visit: Payer: Self-pay | Admitting: Family Medicine

## 2013-08-02 ENCOUNTER — Encounter: Payer: Self-pay | Admitting: Family Medicine

## 2013-08-02 ENCOUNTER — Ambulatory Visit (INDEPENDENT_AMBULATORY_CARE_PROVIDER_SITE_OTHER): Payer: Medicare Other | Admitting: Family Medicine

## 2013-08-02 VITALS — BP 120/80 | Temp 98.2°F | Ht 68.0 in | Wt 158.0 lb

## 2013-08-02 DIAGNOSIS — E785 Hyperlipidemia, unspecified: Secondary | ICD-10-CM

## 2013-08-02 DIAGNOSIS — Z23 Encounter for immunization: Secondary | ICD-10-CM

## 2013-08-02 DIAGNOSIS — N401 Enlarged prostate with lower urinary tract symptoms: Secondary | ICD-10-CM

## 2013-08-02 DIAGNOSIS — J309 Allergic rhinitis, unspecified: Secondary | ICD-10-CM

## 2013-08-02 DIAGNOSIS — Z0000 Encounter for general adult medical examination without abnormal findings: Secondary | ICD-10-CM

## 2013-08-02 DIAGNOSIS — E039 Hypothyroidism, unspecified: Secondary | ICD-10-CM

## 2013-08-02 DIAGNOSIS — D510 Vitamin B12 deficiency anemia due to intrinsic factor deficiency: Secondary | ICD-10-CM

## 2013-08-02 DIAGNOSIS — Z85828 Personal history of other malignant neoplasm of skin: Secondary | ICD-10-CM

## 2013-08-02 LAB — CBC WITH DIFFERENTIAL/PLATELET
Basophils Absolute: 0.1 10*3/uL (ref 0.0–0.1)
Basophils Relative: 0.8 % (ref 0.0–3.0)
Eosinophils Absolute: 0.4 10*3/uL (ref 0.0–0.7)
Eosinophils Relative: 5.6 % — ABNORMAL HIGH (ref 0.0–5.0)
HCT: 45.5 % (ref 39.0–52.0)
Hemoglobin: 15.4 g/dL (ref 13.0–17.0)
Lymphocytes Relative: 23.1 % (ref 12.0–46.0)
Lymphs Abs: 1.5 10*3/uL (ref 0.7–4.0)
Monocytes Relative: 9.1 % (ref 3.0–12.0)
Platelets: 174 10*3/uL (ref 150.0–400.0)
RDW: 13.1 % (ref 11.5–14.6)
WBC: 6.4 10*3/uL (ref 4.5–10.5)

## 2013-08-02 LAB — BASIC METABOLIC PANEL
Calcium: 9.7 mg/dL (ref 8.4–10.5)
Chloride: 102 mEq/L (ref 96–112)
GFR: 71.01 mL/min (ref >60.00)
Glucose, Bld: 100 mg/dL — ABNORMAL HIGH (ref 70–99)
Potassium: 5 mEq/L (ref 3.5–5.1)
Sodium: 140 mEq/L (ref 135–145)

## 2013-08-02 LAB — POCT URINALYSIS DIPSTICK
Bilirubin, UA: NEGATIVE
Glucose, UA: NEGATIVE
Ketones, UA: NEGATIVE
Leukocytes, UA: NEGATIVE
Nitrite, UA: NEGATIVE
pH, UA: 7

## 2013-08-02 LAB — HEPATIC FUNCTION PANEL
ALT: 23 U/L (ref 0–53)
Albumin: 4.2 g/dL (ref 3.5–5.2)
Total Bilirubin: 1.2 mg/dL (ref 0.3–1.2)

## 2013-08-02 LAB — PSA: PSA: 0.9 ng/mL (ref 0.10–4.00)

## 2013-08-02 LAB — LIPID PANEL
HDL: 55.4 mg/dL (ref >39.00)
Total CHOL/HDL Ratio: 3
VLDL: 18 mg/dL (ref 0.0–40.0)

## 2013-08-02 LAB — TSH: TSH: 5.99 u[IU]/mL — ABNORMAL HIGH (ref 0.35–5.50)

## 2013-08-02 MED ORDER — LEVOTHYROXINE SODIUM 25 MCG PO TABS: ORAL_TABLET | ORAL | Status: DC

## 2013-08-02 MED ORDER — CYANOCOBALAMIN 1000 MCG/ML IJ SOLN: 1000.0000 ug | INTRAMUSCULAR | Status: DC

## 2013-08-02 MED ORDER — SIMVASTATIN 20 MG PO TABS: ORAL_TABLET | ORAL | Status: DC

## 2013-08-02 NOTE — Patient Instructions (Signed)
Continue your current medications  Return in one year for general Medicare wellness examination sooner if any problem

## 2013-08-02 NOTE — Progress Notes (Signed)
  Subjective:    Patient ID: Chad Fisher, male    DOB: 06/01/1933, 79 y.o.   MRN: [id number]  HPI Chad Fisher is a 79 year old married male nonsmoker who comes in today for a Medicare wellness examination because of a history of a vitamin B12 deficiency and pernicious anemia, hypothyroidism, hyperlipidemia, and history of skin cancer.  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   HENT: Negative.   Eyes: Negative.   Respiratory: Negative.   Cardiovascular: Negative.   Gastrointestinal: Negative.   Endocrine: Negative.   Genitourinary: Negative.   Musculoskeletal: Negative.   Skin: Negative.   Allergic/Immunologic: Negative.   Neurological: Negative.   Hematological: Negative.   Psychiatric/Behavioral: Negative.   All other systems reviewed and are negative.       Objective:   Physical Exam  Nursing note and vitals reviewed. Constitutional: He is oriented to person, place, and time. He appears well-developed and well-nourished.  HENT:  Head: Normocephalic and atraumatic.  Right Ear: External ear normal.  Left Ear: External ear normal.  Nose: Nose normal.  Mouth/Throat: Oropharynx is clear and moist.  Eyes: Conjunctivae and EOM are normal. Pupils are equal, round, and reactive to light.  Neck: Normal range of motion. Neck supple. No JVD present. No tracheal deviation present. No thyromegaly present.  Cardiovascular: Normal rate, regular rhythm, normal heart sounds and intact distal pulses.  Exam reveals no gallop and no friction rub.   No murmur heard. No carotid artery bruits peripheral pulses 2+ and symmetrical   Pulmonary/Chest: Effort normal and breath sounds normal. No stridor. No respiratory distress. He has no wheezes. He has no rales. He exhibits no tenderness.  Abdominal: Soft. Bowel sounds are normal. He exhibits no distension and no mass. There is no tenderness. There is no rebound and no guarding.  Genitourinary: Rectum normal and penis normal. Guaiac negative stool. No penile tenderness.  1+ symmetrical BPH  Musculoskeletal: Normal range of motion. He exhibits no edema and no tenderness.  Lymphadenopathy:    He has no cervical adenopathy.  Neurological: He is alert and oriented to person, place, and time. He has normal reflexes. No cranial nerve deficit. He exhibits normal muscle tone.  Skin: Skin is warm and dry. No rash noted. No erythema. No pallor.  Skin exam normal because of his history of skin cancer he sees his dermatologist twice yearly  Psychiatric: He has a normal mood and affect. His behavior is normal. Judgment and thought content normal.          Assessment & Plan:  Healthy male  History of hypothyroidism check TSH level  Hyperlipidemia continue simvastatin and aspirin  History of vitamin B12 deficiency continue vitamin B12 injections 1 cc monthly  History of skin cancer followup by Dermody 6 months

## 2014-08-06 ENCOUNTER — Encounter: Payer: Medicare Other | Admitting: Family Medicine

## 2014-08-09 ENCOUNTER — Ambulatory Visit (INDEPENDENT_AMBULATORY_CARE_PROVIDER_SITE_OTHER): Payer: Medicare Other | Admitting: Family Medicine

## 2014-08-09 ENCOUNTER — Encounter: Payer: Self-pay | Admitting: Family Medicine

## 2014-08-09 VITALS — BP 130/80 | Temp 97.9°F | Ht 67.0 in | Wt 156.0 lb

## 2014-08-09 DIAGNOSIS — Z23 Encounter for immunization: Secondary | ICD-10-CM

## 2014-08-09 DIAGNOSIS — N401 Enlarged prostate with lower urinary tract symptoms: Secondary | ICD-10-CM

## 2014-08-09 DIAGNOSIS — Z87442 Personal history of urinary calculi: Secondary | ICD-10-CM

## 2014-08-09 DIAGNOSIS — E033 Postinfectious hypothyroidism: Secondary | ICD-10-CM

## 2014-08-09 DIAGNOSIS — E7800 Pure hypercholesterolemia, unspecified: Secondary | ICD-10-CM

## 2014-08-09 DIAGNOSIS — E7801 Familial hypercholesterolemia: Secondary | ICD-10-CM

## 2014-08-09 DIAGNOSIS — R351 Nocturia: Secondary | ICD-10-CM

## 2014-08-09 DIAGNOSIS — E785 Hyperlipidemia, unspecified: Secondary | ICD-10-CM

## 2014-08-09 DIAGNOSIS — D510 Vitamin B12 deficiency anemia due to intrinsic factor deficiency: Secondary | ICD-10-CM

## 2014-08-09 LAB — POCT URINALYSIS DIPSTICK
Bilirubin, UA: NEGATIVE
Glucose, UA: NEGATIVE
Ketones, UA: NEGATIVE
LEUKOCYTES UA: NEGATIVE
Nitrite, UA: NEGATIVE
Spec Grav, UA: 1.015
UROBILINOGEN UA: 0.2
pH, UA: 6.5

## 2014-08-09 LAB — CBC WITH DIFFERENTIAL/PLATELET
Basophils Absolute: 0.1 10*3/uL (ref 0.0–0.1)
Basophils Relative: 1.1 % (ref 0.0–3.0)
EOS ABS: 0.4 10*3/uL (ref 0.0–0.7)
Eosinophils Relative: 5.8 % — ABNORMAL HIGH (ref 0.0–5.0)
HEMATOCRIT: 46.7 % (ref 39.0–52.0)
Hemoglobin: 15.6 g/dL (ref 13.0–17.0)
LYMPHS ABS: 1.5 10*3/uL (ref 0.7–4.0)
Lymphocytes Relative: 24.8 % (ref 12.0–46.0)
MCHC: 33.4 g/dL (ref 30.0–36.0)
MCV: 95.2 fl (ref 78.0–100.0)
MONO ABS: 0.6 10*3/uL (ref 0.1–1.0)
Monocytes Relative: 9.9 % (ref 3.0–12.0)
NEUTROS PCT: 58.4 % (ref 43.0–77.0)
Neutro Abs: 3.6 10*3/uL (ref 1.4–7.7)
PLATELETS: 183 10*3/uL (ref 150.0–400.0)
RBC: 4.91 Mil/uL (ref 4.22–5.81)
RDW: 13 % (ref 11.5–15.5)
WBC: 6.2 10*3/uL (ref 4.0–10.5)

## 2014-08-09 LAB — LIPID PANEL
CHOLESTEROL: 217 mg/dL — AB (ref 0–200)
HDL: 51.7 mg/dL (ref >39.00)
LDL Cholesterol: 147 mg/dL — ABNORMAL HIGH (ref 0–99)
NonHDL: 165.3
TRIGLYCERIDES: 91 mg/dL (ref 0.0–149.0)
Total CHOL/HDL Ratio: 4
VLDL: 18.2 mg/dL (ref 0.0–40.0)

## 2014-08-09 LAB — HEPATIC FUNCTION PANEL
ALT: 18 U/L (ref 0–53)
AST: 23 U/L (ref 0–37)
Albumin: 3.7 g/dL (ref 3.5–5.2)
Alkaline Phosphatase: 82 U/L (ref 39–117)
BILIRUBIN DIRECT: 0.2 mg/dL (ref 0.0–0.3)
BILIRUBIN TOTAL: 1.3 mg/dL — AB (ref 0.2–1.2)
TOTAL PROTEIN: 7.3 g/dL (ref 6.0–8.3)

## 2014-08-09 LAB — BASIC METABOLIC PANEL
BUN: 18 mg/dL (ref 6–23)
CALCIUM: 9.5 mg/dL (ref 8.4–10.5)
CO2: 35 mEq/L — ABNORMAL HIGH (ref 19–32)
Chloride: 103 mEq/L (ref 96–112)
Creatinine, Ser: 1 mg/dL (ref 0.4–1.5)
GFR: 74.85 mL/min (ref >60.00)
Glucose, Bld: 88 mg/dL (ref 70–99)
POTASSIUM: 4.8 meq/L (ref 3.5–5.1)
SODIUM: 139 meq/L (ref 135–145)

## 2014-08-09 MED ORDER — CYANOCOBALAMIN 1000 MCG/ML IJ SOLN: 1000.0000 ug | INTRAMUSCULAR | Status: DC

## 2014-08-09 MED ORDER — LEVOTHYROXINE SODIUM 25 MCG PO TABS: ORAL_TABLET | ORAL | Status: DC

## 2014-08-09 MED ORDER — POTASSIUM CITRATE ER 10 MEQ (1080 MG) PO TBCR: EXTENDED_RELEASE_TABLET | ORAL | Status: DC

## 2014-08-09 MED ORDER — SIMVASTATIN 20 MG PO TABS: ORAL_TABLET | ORAL | Status: DC

## 2014-08-09 NOTE — Progress Notes (Signed)
Pre visit review using our clinic review tool, if applicable. No additional management support is needed unless otherwise documented below in the visit note. 

## 2014-08-09 NOTE — Patient Instructions (Signed)
Continue current medications  Follow-up in one year sooner if any problems 

## 2014-08-09 NOTE — Progress Notes (Signed)
   Subjective:    Patient ID: Chad Fisher, male    DOB: October 01, 1933, 80 y.o.   MRN: [id number]  HPI Chad Fisher is a 80 year old married male nonsmoker who comes in today for evaluation of hyperlipidemia, hypothyroidism, pernicious anemia and a history of skin cancer  Meds reviewed to been no changes except he is having difficulty getting his vitamin B12.  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   HENT: Negative.   Eyes: Negative.   Respiratory: Negative.   Cardiovascular: Negative.   Gastrointestinal: Negative.   Endocrine: Negative.   Genitourinary: Negative.   Musculoskeletal: Negative.   Skin: Negative.   Allergic/Immunologic: Negative.   Neurological: Negative.   Hematological: Negative.   Psychiatric/Behavioral: Negative.        Objective:   Physical Exam  Nursing note and vitals reviewed. Constitutional: He is oriented to person, place, and time. He appears well-developed and well-nourished.  HENT:  Head: Normocephalic and atraumatic.  Right Ear: External ear normal.  Left Ear: External ear normal.  Nose: Nose normal.  Mouth/Throat: Oropharynx is clear and moist.  Eyes: Conjunctivae and EOM are normal. Pupils are equal, round, and reactive to light.  Neck: Normal range of motion. Neck supple. No JVD present. No tracheal deviation present. No thyromegaly present.  Cardiovascular: Normal rate, regular rhythm, normal heart sounds and intact distal pulses.  Exam reveals no gallop and no friction rub.   No murmur heard. Wakeup no carotid nor aortic bruits peripheral pulses 2+  and symmetrical  Pulmonary/Chest: Effort normal and breath sounds normal. No stridor. No respiratory distress. He has no wheezes. He has no rales. He exhibits no tenderness.  Abdominal: Soft. Bowel sounds are normal. He exhibits no distension and no mass. There is no tenderness. There is no rebound and no guarding.  Genitourinary: Rectum normal, prostate normal and penis normal. Guaiac negative stool. No penile tenderness.  2+ symmetrical nonnodular BPH  Musculoskeletal: Normal range of motion. He exhibits no edema and no tenderness.  Lymphadenopathy:    He has no cervical adenopathy.  Neurological: He is alert and oriented to person, place, and time. He has normal reflexes. No cranial nerve deficit. He exhibits normal muscle tone.  Skin: Skin is warm and dry. No rash noted. No erythema. No pallor.  Total body skin exam normal,,,,,,, he's had a history of skin cancer and he sees his dermatologist Dr. Ronnald Ramp every 6 months  Psychiatric: He has a normal mood and affect. His behavior is normal. Judgment and thought content normal.          Assessment & Plan:  Healthy male  Vitamin B12 deficiency....... check CBC...... B12 1 mL monthly  Hypothyroidism....... check TSH level refill medication  Hyperlipidemia............ continue aspirin and simvastatin......... he actually takes 1 Monday Wednesday Friday.........  History of kidney stones............ Contin potassiums citrate 10 mEq daily  History of skin cancer follow-up by dermatologist Dr. Ronnald Ramp every 6 months.Marland KitchenMarland Kitchen

## 2014-08-10 ENCOUNTER — Other Ambulatory Visit: Payer: Self-pay | Admitting: Family Medicine

## 2014-08-10 DIAGNOSIS — R319 Hematuria, unspecified: Secondary | ICD-10-CM

## 2014-08-10 LAB — PSA: PSA: 0.82 ng/mL (ref 0.10–4.00)

## 2014-08-10 LAB — TSH: TSH: 6.44 u[IU]/mL — ABNORMAL HIGH (ref 0.35–4.50)

## 2014-08-14 ENCOUNTER — Other Ambulatory Visit (INDEPENDENT_AMBULATORY_CARE_PROVIDER_SITE_OTHER): Payer: Medicare Other

## 2014-08-14 DIAGNOSIS — R319 Hematuria, unspecified: Secondary | ICD-10-CM

## 2014-08-14 LAB — POCT URINALYSIS DIPSTICK
BILIRUBIN UA: NEGATIVE
Glucose, UA: NEGATIVE
Ketones, UA: NEGATIVE
LEUKOCYTES UA: NEGATIVE
NITRITE UA: NEGATIVE
PH UA: 5
Spec Grav, UA: 1.015
Urobilinogen, UA: 0.2

## 2014-09-14 ENCOUNTER — Other Ambulatory Visit: Payer: Self-pay | Admitting: *Deleted

## 2014-09-14 DIAGNOSIS — Z87442 Personal history of urinary calculi: Secondary | ICD-10-CM

## 2014-09-14 MED ORDER — POTASSIUM CITRATE ER 10 MEQ (1080 MG) PO TBCR: EXTENDED_RELEASE_TABLET | ORAL | Status: DC

## 2014-11-13 ENCOUNTER — Encounter: Payer: Self-pay | Admitting: Family Medicine

## 2014-11-21 DIAGNOSIS — D0462 Carcinoma in situ of skin of left upper limb, including shoulder: Secondary | ICD-10-CM | POA: Diagnosis not present

## 2014-11-21 DIAGNOSIS — C44519 Basal cell carcinoma of skin of other part of trunk: Secondary | ICD-10-CM | POA: Diagnosis not present

## 2014-11-21 DIAGNOSIS — L821 Other seborrheic keratosis: Secondary | ICD-10-CM | POA: Diagnosis not present

## 2014-11-21 DIAGNOSIS — Z85828 Personal history of other malignant neoplasm of skin: Secondary | ICD-10-CM | POA: Diagnosis not present

## 2014-11-21 DIAGNOSIS — L570 Actinic keratosis: Secondary | ICD-10-CM | POA: Diagnosis not present

## 2014-11-21 DIAGNOSIS — C44612 Basal cell carcinoma of skin of right upper limb, including shoulder: Secondary | ICD-10-CM | POA: Diagnosis not present

## 2014-11-21 DIAGNOSIS — L723 Sebaceous cyst: Secondary | ICD-10-CM | POA: Diagnosis not present

## 2014-11-22 ENCOUNTER — Other Ambulatory Visit: Payer: Self-pay | Admitting: *Deleted

## 2014-11-22 DIAGNOSIS — E033 Postinfectious hypothyroidism: Secondary | ICD-10-CM

## 2014-11-22 DIAGNOSIS — E7800 Pure hypercholesterolemia, unspecified: Secondary | ICD-10-CM

## 2014-11-22 MED ORDER — LEVOTHYROXINE SODIUM 25 MCG PO TABS: ORAL_TABLET | ORAL | Status: DC

## 2014-11-22 MED ORDER — SIMVASTATIN 20 MG PO TABS: ORAL_TABLET | ORAL | Status: DC

## 2015-03-25 ENCOUNTER — Telehealth: Payer: Self-pay | Admitting: Family Medicine

## 2015-03-25 DIAGNOSIS — D510 Vitamin B12 deficiency anemia due to intrinsic factor deficiency: Secondary | ICD-10-CM

## 2015-03-25 MED ORDER — CYANOCOBALAMIN 1000 MCG/ML IJ SOLN: 1000.0000 ug | INTRAMUSCULAR | Status: DC

## 2015-03-25 NOTE — Telephone Encounter (Signed)
Rx sent 

## 2015-03-25 NOTE — Telephone Encounter (Signed)
Patient came in for a RX refill on Cyanocobalamin 1000 mg inject. Please fax over to [phone] [facility].

## 2015-05-22 DIAGNOSIS — Z85828 Personal history of other malignant neoplasm of skin: Secondary | ICD-10-CM | POA: Diagnosis not present

## 2015-05-22 DIAGNOSIS — L570 Actinic keratosis: Secondary | ICD-10-CM | POA: Diagnosis not present

## 2015-05-22 DIAGNOSIS — L820 Inflamed seborrheic keratosis: Secondary | ICD-10-CM | POA: Diagnosis not present

## 2015-05-22 DIAGNOSIS — D0439 Carcinoma in situ of skin of other parts of face: Secondary | ICD-10-CM | POA: Diagnosis not present

## 2015-06-14 ENCOUNTER — Ambulatory Visit (INDEPENDENT_AMBULATORY_CARE_PROVIDER_SITE_OTHER): Payer: Medicare Other | Admitting: Family Medicine

## 2015-06-14 ENCOUNTER — Encounter: Payer: Self-pay | Admitting: Family Medicine

## 2015-06-14 VITALS — BP 119/69 | HR 69 | Temp 98.6°F | Ht 67.0 in | Wt 158.0 lb

## 2015-06-14 DIAGNOSIS — M5432 Sciatica, left side: Secondary | ICD-10-CM | POA: Diagnosis not present

## 2015-06-14 DIAGNOSIS — D510 Vitamin B12 deficiency anemia due to intrinsic factor deficiency: Secondary | ICD-10-CM

## 2015-06-14 MED ORDER — CYANOCOBALAMIN 1000 MCG/ML IJ SOLN: 1000.0000 ug | INTRAMUSCULAR | Status: DC

## 2015-06-14 MED ORDER — METHYLPREDNISOLONE 4 MG PO TBPK: ORAL_TABLET | ORAL | Status: DC

## 2015-06-14 NOTE — Progress Notes (Signed)
Pre visit review using our clinic review tool, if applicable. No additional management support is needed unless otherwise documented below in the visit note. 

## 2015-06-14 NOTE — Progress Notes (Signed)
   Subjective:    Patient ID: Chad Fisher, male    DOB: Feb 12, 1933, 81 y.o.   MRN: [id number]  HPI Here for 3 days of intermittent sharp pains in the left thigh that start in the anterior thigh and run around to the lateral left hip and left buttock. No recent trauma. No numbness or weakness in the leg. Using heat and [facility]. Ibuprofen helps a little.    Review of Systems  Constitutional: Negative.   Respiratory: Negative.   Cardiovascular: Negative.   Gastrointestinal: Negative.   Genitourinary: Negative.   Musculoskeletal: Positive for arthralgias.  Neurological: Negative.        Objective:   Physical Exam  Constitutional: He is oriented to person, place, and time. He appears well-developed and well-nourished. No distress.  Cardiovascular: Normal rate, regular rhythm, normal heart sounds and intact distal pulses.   Pulmonary/Chest: Effort normal and breath sounds normal.  Musculoskeletal:  Tender in the left lower back and sciatic notch. Full ROM of the spine.   Neurological: He is alert and oriented to person, place, and time.  Skin: No rash noted.          Assessment & Plan:  Sciatica. Use heat, rest. Given a Medrol dose pack

## 2015-07-01 ENCOUNTER — Telehealth: Payer: Self-pay | Admitting: Family Medicine

## 2015-07-03 ENCOUNTER — Other Ambulatory Visit: Payer: Self-pay | Admitting: Family Medicine

## 2015-07-03 NOTE — Telephone Encounter (Signed)
error 

## 2015-09-26 DIAGNOSIS — H25013 Cortical age-related cataract, bilateral: Secondary | ICD-10-CM | POA: Diagnosis not present

## 2015-09-26 DIAGNOSIS — H01003 Unspecified blepharitis right eye, unspecified eyelid: Secondary | ICD-10-CM | POA: Diagnosis not present

## 2015-09-26 DIAGNOSIS — H40013 Open angle with borderline findings, low risk, bilateral: Secondary | ICD-10-CM | POA: Diagnosis not present

## 2015-09-26 DIAGNOSIS — H2513 Age-related nuclear cataract, bilateral: Secondary | ICD-10-CM | POA: Diagnosis not present

## 2015-10-30 DIAGNOSIS — N200 Calculus of kidney: Secondary | ICD-10-CM | POA: Diagnosis not present

## 2015-10-30 DIAGNOSIS — N289 Disorder of kidney and ureter, unspecified: Secondary | ICD-10-CM | POA: Diagnosis not present

## 2015-10-30 DIAGNOSIS — N281 Cyst of kidney, acquired: Secondary | ICD-10-CM | POA: Diagnosis not present

## 2015-10-30 DIAGNOSIS — Z0000 Encounter for general adult medical examination without abnormal findings: Secondary | ICD-10-CM | POA: Diagnosis not present

## 2015-11-08 MED FILL — POTASSIUM CITRATE ER 10 MEQ: 10 MEQ | 30 days supply | Qty: 120 | Fill #0

## 2015-11-12 ENCOUNTER — Ambulatory Visit (INDEPENDENT_AMBULATORY_CARE_PROVIDER_SITE_OTHER): Payer: Medicare Other | Admitting: Family Medicine

## 2015-11-12 ENCOUNTER — Encounter: Payer: Self-pay | Admitting: Family Medicine

## 2015-11-12 VITALS — BP 110/80 | Temp 97.8°F | Ht 67.5 in | Wt 160.0 lb

## 2015-11-12 DIAGNOSIS — E7801 Familial hypercholesterolemia: Secondary | ICD-10-CM

## 2015-11-12 DIAGNOSIS — D510 Vitamin B12 deficiency anemia due to intrinsic factor deficiency: Secondary | ICD-10-CM | POA: Diagnosis not present

## 2015-11-12 DIAGNOSIS — E033 Postinfectious hypothyroidism: Secondary | ICD-10-CM

## 2015-11-12 DIAGNOSIS — Z0000 Encounter for general adult medical examination without abnormal findings: Secondary | ICD-10-CM

## 2015-11-12 DIAGNOSIS — E7800 Pure hypercholesterolemia, unspecified: Secondary | ICD-10-CM

## 2015-11-12 LAB — POCT URINALYSIS DIPSTICK
BILIRUBIN UA: NEGATIVE
Blood, UA: NEGATIVE
GLUCOSE UA: NEGATIVE
Ketones, UA: NEGATIVE
LEUKOCYTES UA: NEGATIVE
NITRITE UA: NEGATIVE
Protein, UA: NEGATIVE
Spec Grav, UA: 1.02
Urobilinogen, UA: 0.2
pH, UA: 6.5

## 2015-11-12 LAB — CBC WITH DIFFERENTIAL/PLATELET
BASOS PCT: 0.8 % (ref 0.0–3.0)
Basophils Absolute: 0.1 10*3/uL (ref 0.0–0.1)
EOS ABS: 0.5 10*3/uL (ref 0.0–0.7)
Eosinophils Relative: 7.5 % — ABNORMAL HIGH (ref 0.0–5.0)
HEMATOCRIT: 47.7 % (ref 39.0–52.0)
HEMOGLOBIN: 15.9 g/dL (ref 13.0–17.0)
LYMPHS PCT: 26.5 % (ref 12.0–46.0)
Lymphs Abs: 1.8 10*3/uL (ref 0.7–4.0)
MCHC: 33.4 g/dL (ref 30.0–36.0)
MCV: 94.3 fl (ref 78.0–100.0)
MONOS PCT: 9.9 % (ref 3.0–12.0)
Monocytes Absolute: 0.7 10*3/uL (ref 0.1–1.0)
Neutro Abs: 3.7 10*3/uL (ref 1.4–7.7)
Neutrophils Relative %: 55.3 % (ref 43.0–77.0)
Platelets: 181 10*3/uL (ref 150.0–400.0)
RBC: 5.06 Mil/uL (ref 4.22–5.81)
RDW: 13.2 % (ref 11.5–15.5)
WBC: 6.7 10*3/uL (ref 4.0–10.5)

## 2015-11-12 LAB — BASIC METABOLIC PANEL
BUN: 19 mg/dL (ref 6–23)
CALCIUM: 9.8 mg/dL (ref 8.4–10.5)
CO2: 30 meq/L (ref 19–32)
Chloride: 102 mEq/L (ref 96–112)
Creatinine, Ser: 1.05 mg/dL (ref 0.40–1.50)
GFR: 72.16 mL/min (ref >60.00)
GLUCOSE: 95 mg/dL (ref 70–99)
POTASSIUM: 5 meq/L (ref 3.5–5.1)
SODIUM: 141 meq/L (ref 135–145)

## 2015-11-12 LAB — HEPATIC FUNCTION PANEL
ALT: 16 U/L (ref 0–53)
AST: 23 U/L (ref 0–37)
Albumin: 4.2 g/dL (ref 3.5–5.2)
Alkaline Phosphatase: 72 U/L (ref 39–117)
BILIRUBIN TOTAL: 1 mg/dL (ref 0.2–1.2)
Bilirubin, Direct: 0.2 mg/dL (ref 0.0–0.3)
Total Protein: 7.6 g/dL (ref 6.0–8.3)

## 2015-11-12 LAB — LIPID PANEL
CHOL/HDL RATIO: 4
CHOLESTEROL: 220 mg/dL — AB (ref 0–200)
HDL: 53.7 mg/dL (ref >39.00)
LDL CALC: 149 mg/dL — AB (ref 0–99)
NONHDL: 166.74
Triglycerides: 90 mg/dL (ref 0.0–149.0)
VLDL: 18 mg/dL (ref 0.0–40.0)

## 2015-11-12 LAB — TSH: TSH: 8.48 u[IU]/mL — ABNORMAL HIGH (ref 0.35–4.50)

## 2015-11-12 MED ORDER — LEVOTHYROXINE SODIUM 25 MCG PO TABS: ORAL_TABLET | ORAL | Status: DC

## 2015-11-12 MED ORDER — SIMVASTATIN 20 MG PO TABS: ORAL_TABLET | ORAL | Status: DC

## 2015-11-12 MED ORDER — CYANOCOBALAMIN 1000 MCG/ML IJ SOLN: INTRAMUSCULAR | Status: DC

## 2015-11-12 NOTE — Patient Instructions (Signed)
Continue current medications diet and exercise  Labs today.......Marland Kitchen we will call you the report if there is anything abnormal  Tommi Rumps or Almyra Free are 2 new adult nurse practitioner's or Dr. Martinique

## 2015-11-12 NOTE — Progress Notes (Signed)
   Subjective:    Patient ID: Chad Fisher, male    DOB: Sep 17, 1933, 82 y.o.   MRN: [id number]  HPI  [name] is a 82 year old married male nonsmoker who comes in today for general physical examination because of a history of necessary anemia, hypothyroidism, and hyperlipidemia  4 B12 deficiency which is been documented he takes 1 mL of B12 monthly    he takes Synthroid 25 g daily for hypothyroidism  He takes Zocor 20 mg 3 times weekly because of a history of hyperlipidemia.   He gets routine eye care, dental care, colonoscopy 2008 was normal and at age 22 therefore he needs no more further colonoscopies.    he sees his dermatologist on a yearly basis Dr. Ronnald Ramp because he's had a history of skin cancer.   He also sees his urologist on a regular basis causes had a history of  Kidney stones.   Cognitive function normal he still works part-time, home health safety reviewed no issues identified, no guns in the house, he does have a healthcare power of attorney and living well. He walks 2-3 miles per day   Vaccinations up-to-date   Review of Systems  Constitutional: Negative.   HENT: Negative.   Eyes: Negative.   Respiratory: Negative.   Cardiovascular: Negative.   Gastrointestinal: Negative.   Endocrine: Negative.   Genitourinary: Negative.   Musculoskeletal: Negative.   Skin: Negative.   Allergic/Immunologic: Negative.   Neurological: Negative.   Hematological: Negative.   Psychiatric/Behavioral: Negative.        Objective:   Physical Exam  Constitutional: He is oriented to person, place, and time. He appears well-developed and well-nourished.  HENT:  Head: Normocephalic and atraumatic.  Right Ear: External ear normal.  Left Ear: External ear normal.  Nose: Nose normal.  Mouth/Throat: Oropharynx is clear and moist.  Eyes: Conjunctivae and EOM are normal. Pupils are equal, round, and reactive to light.  Neck: Normal range of motion. Neck supple. No JVD present. No  tracheal deviation present. No thyromegaly present.  Cardiovascular: Normal rate, regular rhythm, normal heart sounds and intact distal pulses.  Exam reveals no gallop and no friction rub.   No murmur heard. No carotid nor aortic bruits peripheral pulses 2+ and symmetrical  Pulmonary/Chest: Effort normal and breath sounds normal. No stridor. No respiratory distress. He has no wheezes. He has no rales. He exhibits no tenderness.  Abdominal: Soft. Bowel sounds are normal. He exhibits no distension and no mass. There is no tenderness. There is no rebound and no guarding.  Genitourinary: Rectum normal, prostate normal and penis normal. Guaiac negative stool. No penile tenderness.  2+ symmetrical nonnodular BPH  Musculoskeletal: Normal range of motion. He exhibits no edema or tenderness.  Lymphadenopathy:    He has no cervical adenopathy.  Neurological: He is alert and oriented to person, place, and time. He has normal reflexes. No cranial nerve deficit. He exhibits normal muscle tone.  Skin: Skin is warm and dry. No rash noted. No erythema. No pallor.  Light skin and light eyes evidence of fairly diffuse sun damage. He sees his dermatologist twice yearly  Psychiatric: He has a normal mood and affect. His behavior is normal. Judgment and thought content normal.  Nursing note and vitals reviewed.         Assessment & Plan:  ,,,,

## 2015-11-12 NOTE — Progress Notes (Signed)
Pre visit review using our clinic review tool, if applicable. No additional management support is needed unless otherwise documented below in the visit note. 

## 2015-11-13 ENCOUNTER — Other Ambulatory Visit: Payer: Self-pay | Admitting: *Deleted

## 2015-11-13 MED ORDER — LEVOTHYROXINE SODIUM 50 MCG PO TABS: 50.0000 ug | ORAL_TABLET | Freq: Every day | ORAL | Status: DC

## 2015-11-14 DIAGNOSIS — N289 Disorder of kidney and ureter, unspecified: Secondary | ICD-10-CM | POA: Diagnosis not present

## 2015-11-14 DIAGNOSIS — N281 Cyst of kidney, acquired: Secondary | ICD-10-CM | POA: Diagnosis not present

## 2015-11-20 DIAGNOSIS — L720 Epidermal cyst: Secondary | ICD-10-CM | POA: Diagnosis not present

## 2015-11-20 DIAGNOSIS — L718 Other rosacea: Secondary | ICD-10-CM | POA: Diagnosis not present

## 2015-11-20 DIAGNOSIS — L570 Actinic keratosis: Secondary | ICD-10-CM | POA: Diagnosis not present

## 2015-11-20 DIAGNOSIS — Z85828 Personal history of other malignant neoplasm of skin: Secondary | ICD-10-CM | POA: Diagnosis not present

## 2015-11-20 DIAGNOSIS — L821 Other seborrheic keratosis: Secondary | ICD-10-CM | POA: Diagnosis not present

## 2016-01-13 DIAGNOSIS — H40013 Open angle with borderline findings, low risk, bilateral: Secondary | ICD-10-CM | POA: Diagnosis not present

## 2016-01-13 MED FILL — POTASSIUM CITRATE ER 10 MEQ: 10 MEQ | 30 days supply | Qty: 120 | Fill #1

## 2016-02-03 ENCOUNTER — Ambulatory Visit (INDEPENDENT_AMBULATORY_CARE_PROVIDER_SITE_OTHER): Payer: Medicare Other | Admitting: Family Medicine

## 2016-02-03 ENCOUNTER — Other Ambulatory Visit: Payer: Self-pay | Admitting: Family Medicine

## 2016-02-03 ENCOUNTER — Encounter: Payer: Self-pay | Admitting: Family Medicine

## 2016-02-03 VITALS — BP 104/72 | HR 64 | Temp 97.9°F | Wt 159.0 lb

## 2016-02-03 DIAGNOSIS — D510 Vitamin B12 deficiency anemia due to intrinsic factor deficiency: Secondary | ICD-10-CM

## 2016-02-03 DIAGNOSIS — E039 Hypothyroidism, unspecified: Secondary | ICD-10-CM | POA: Diagnosis not present

## 2016-02-03 DIAGNOSIS — E785 Hyperlipidemia, unspecified: Secondary | ICD-10-CM | POA: Diagnosis not present

## 2016-02-03 DIAGNOSIS — Z85828 Personal history of other malignant neoplasm of skin: Secondary | ICD-10-CM | POA: Diagnosis not present

## 2016-02-03 DIAGNOSIS — Z87442 Personal history of urinary calculi: Secondary | ICD-10-CM | POA: Insufficient documentation

## 2016-02-03 LAB — VITAMIN B12: Vitamin B-12: 230 pg/mL (ref 211–911)

## 2016-02-03 LAB — TSH: TSH: 4.77 u[IU]/mL — ABNORMAL HIGH (ref 0.35–4.50)

## 2016-02-03 MED ORDER — LEVOTHYROXINE SODIUM 75 MCG PO TABS: 75.0000 ug | ORAL_TABLET | Freq: Every day | ORAL | Status: DC

## 2016-02-03 NOTE — Assessment & Plan Note (Signed)
S: poorly controlled on last check with synthroid 25 mcg- increased 3 months ago to 50mg  Lab Results  Component Value Date   TSH 8.48* 11/12/2015  A/P: repeat tsh today, adjust as needed

## 2016-02-03 NOTE — Assessment & Plan Note (Signed)
S: history of pernicious anemia just like his father. 40 years of b12 injections Lab Results  Component Value Date   [id number] 12/02/2006  A/P: insurance is not covering anymore- due to pernicous anemia have asked him to request prior auth through pharmacy and we will complete- this should be covered in this instance

## 2016-02-03 NOTE — Patient Instructions (Signed)
Thyroid test before you leave  No other changes  You are doing great!   See you back next January for your physical unless you need [place] sooner

## 2016-02-03 NOTE — Assessment & Plan Note (Signed)
S: poorly controlled on simvastatin 20mg  3x a week (LDL down from near 190 to 150 on this). No myalgias.  Lab Results  Component Value Date   CHOL 220* 11/12/2015   HDL 53.70 11/12/2015   LDLCALC 149* 11/12/2015   LDLDIRECT 187.8 12/02/2006   TRIG 90.0 11/12/2015   CHOLHDL 4 11/12/2015   A/P: evidence for primary prevention at age 82 unclear, likely has some benefit from simvastatin and is tolerating- will continue current rx at 3x a week

## 2016-02-03 NOTE — Progress Notes (Signed)
Subjective:  Chad Fisher is a 82 y.o. year old very pleasant male patient who presents for/with See problem oriented charting ROS- No chest pain or shortness of breath. No headache or blurry vision. No hair or nail changes. No heat/cold intolerance.   Past Medical History-  Patient Active Problem List   Diagnosis Date Noted  . Hypothyroidism 08/02/2013    Priority: Medium  . Pernicious anemia 08/02/2013    Priority: Medium  . Hyperlipidemia 12/20/2007    Priority: Medium  . History of kidney stones 02/03/2016  . SKIN CANCER, HX OF 05/04/2007    Medications- reviewed and updated Current Outpatient Prescriptions  Medication Sig Dispense Refill  . aspirin 81 MG tablet Take 160 mg by mouth 3 (three) times a week.     . cyanocobalamin (,VITAMIN B-12,) 1000 MCG/ML injection INJECT 1ML INTO MUSCLE EVERY 30 DAYS 30 mL 5  . levothyroxine (SYNTHROID, LEVOTHROID) 50 MCG tablet Take 1 tablet (50 mcg total) by mouth daily. 90 tablet 3  . potassium citrate (UROCIT-K) 10 MEQ (1080 MG) SR tablet 4 tabs daily 400 tablet 3  . simvastatin (ZOCOR) 20 MG tablet TAKE ONE TABLET BY MOUTH IN THE EVENING 100 tablet 3   No current facility-administered medications for this visit.    Objective: BP 104/72 mmHg  Pulse 64  Temp(Src) 97.9 F (36.6 C)  Wt 159 lb (72.122 kg) Gen: NAD, resting comfortably CV: RRR no murmurs rubs or gallops Lungs: CTAB no crackles, wheeze, rhonchi Abdomen: soft/nontender/nondistended/normal bowel sounds. No rebound or guarding.  Ext: no edema Skin: warm, dry Neuro: grossly normal, moves all extremities  Assessment/Plan:  Hyperlipidemia S: poorly controlled on simvastatin 20mg  3x a week (LDL down from near 190 to 150 on this). No myalgias.  Lab Results  Component Value Date   CHOL 220* 11/12/2015   HDL 53.70 11/12/2015   LDLCALC 149* 11/12/2015   LDLDIRECT 187.8 12/02/2006   TRIG 90.0 11/12/2015   CHOLHDL 4 11/12/2015   A/P: evidence for primary prevention at  age 35 unclear, likely has some benefit from simvastatin and is tolerating- will continue current rx at 3x a week  Hypothyroidism S: poorly controlled on last check with synthroid 25 mcg- increased 3 months ago to 50mg  Lab Results  Component Value Date   TSH 8.48* 11/12/2015  A/P: repeat tsh today, adjust as needed  Pernicious anemia S: history of pernicious anemia just like his father. 40 years of b12 injections Lab Results  Component Value Date   [id number] 12/02/2006  A/P: insurance is not covering anymore- due to pernicous anemia have asked him to request prior auth through pharmacy and we will complete- this should be covered in this instance   Return in about 9 months (around 11/04/2016). Return precautions advised.   Orders Placed This Encounter  Procedures  . TSH    [place]  . Vitamin B12   Garret Reddish, MD

## 2016-02-13 ENCOUNTER — Telehealth: Payer: Self-pay

## 2016-02-13 ENCOUNTER — Telehealth: Payer: Self-pay | Admitting: Family Medicine

## 2016-02-13 NOTE — Telephone Encounter (Signed)
Called to inform pt that insurance will not cover b12 injections and Dr. Yong Channel wants pt to take 1068mcg of b12 in a pill form that he can get OTC. Pt would like to think about what he wants to do and he will give me a call back.

## 2016-02-13 NOTE — Telephone Encounter (Signed)
PA done on Cover my meds. For Cyanocobalamin Determination will be back in 3-5 business days

## 2016-02-27 ENCOUNTER — Telehealth: Payer: Self-pay | Admitting: Family Medicine

## 2016-02-27 NOTE — Telephone Encounter (Signed)
b12 injections monthly not covered by insurance- 02/14/16 will trial 1034mcg daily oral- recheck at follow up.

## 2016-02-27 NOTE — Telephone Encounter (Signed)
PA for cyanocobalamin is denied as Medicare does not cover vitamins/supplements

## 2016-03-16 DIAGNOSIS — N200 Calculus of kidney: Secondary | ICD-10-CM | POA: Diagnosis not present

## 2016-03-18 DIAGNOSIS — R310 Gross hematuria: Secondary | ICD-10-CM | POA: Diagnosis not present

## 2016-03-18 DIAGNOSIS — N201 Calculus of ureter: Secondary | ICD-10-CM | POA: Diagnosis not present

## 2016-03-23 ENCOUNTER — Other Ambulatory Visit: Payer: Self-pay | Admitting: Family Medicine

## 2016-03-23 MED FILL — POTASSIUM CITRATE ER 10 MEQ: 10 MEQ | 30 days supply | Qty: 120 | Fill #2

## 2016-04-03 ENCOUNTER — Telehealth: Payer: Self-pay | Admitting: Family Medicine

## 2016-04-03 ENCOUNTER — Encounter: Payer: Self-pay | Admitting: Family Medicine

## 2016-04-03 ENCOUNTER — Other Ambulatory Visit: Payer: Self-pay | Admitting: *Deleted

## 2016-04-03 ENCOUNTER — Ambulatory Visit (INDEPENDENT_AMBULATORY_CARE_PROVIDER_SITE_OTHER): Payer: Medicare Other | Admitting: Family Medicine

## 2016-04-03 ENCOUNTER — Other Ambulatory Visit (INDEPENDENT_AMBULATORY_CARE_PROVIDER_SITE_OTHER): Payer: Medicare Other

## 2016-04-03 VITALS — BP 138/80 | HR 69 | Temp 97.9°F | Ht 67.5 in | Wt 150.0 lb

## 2016-04-03 DIAGNOSIS — M545 Low back pain, unspecified: Secondary | ICD-10-CM

## 2016-04-03 DIAGNOSIS — E039 Hypothyroidism, unspecified: Secondary | ICD-10-CM | POA: Diagnosis not present

## 2016-04-03 LAB — TSH: TSH: 1.06 u[IU]/mL (ref 0.35–4.50)

## 2016-04-03 MED ORDER — PREDNISONE 20 MG PO TABS: ORAL_TABLET | ORAL | Status: DC

## 2016-04-03 NOTE — Telephone Encounter (Signed)
Pt had a lab appointment today and stopped by to see if you had an opening. Pt states he has had lower right side back pain for a month. Pt states it comes and goes. Pt is tied up next week with  taking his daughter to wake/she has cancer.  Pt would like to talk with you about this.  Pt states it is sever at times and he is still working.  Prefers to see Dr Yong Channel only.  Asked if there is anyway Dr Yong Channel will see him today.

## 2016-04-03 NOTE — Patient Instructions (Signed)
Trial prednisone for 7 days  If pain not doing better, call us and we will refer to physical therapy.   If still having pain 3-4 weeks from now, get orthopedic opinion

## 2016-04-03 NOTE — Progress Notes (Signed)
Subjective:  Chad Fisher is a 82 y.o. year old very pleasant male patient who presents for/with See problem oriented charting ROS- see any ROS included in HPI as well.   Past Medical History-  Patient Active Problem List   Diagnosis Date Noted  . Hypothyroidism 08/02/2013    Priority: Medium  . Pernicious anemia 08/02/2013    Priority: Medium  . Hyperlipidemia 12/20/2007    Priority: Medium  . History of kidney stones 02/03/2016  . SKIN CANCER, HX OF 05/04/2007    Medications- reviewed and updated Current Outpatient Prescriptions  Medication Sig Dispense Refill  . aspirin 81 MG tablet Take 160 mg by mouth 3 (three) times a week.     . cyanocobalamin (,VITAMIN B-12,) 1000 MCG/ML injection INJECT 1ML INTO MUSCLE EVERY 30 DAYS 30 mL 5  . levothyroxine (SYNTHROID, LEVOTHROID) 50 MCG tablet Take 1 tablet (50 mcg total) by mouth daily. 90 tablet 3  . levothyroxine (SYNTHROID, LEVOTHROID) 75 MCG tablet Take 1 tablet by mouth  daily 90 tablet 3  . potassium citrate (UROCIT-K) 10 MEQ (1080 MG) SR tablet 4 tabs daily 400 tablet 3  . simvastatin (ZOCOR) 20 MG tablet TAKE ONE TABLET BY MOUTH IN THE EVENING 100 tablet 3   No current facility-administered medications for this visit.    Objective: BP 138/80 mmHg  Pulse 69  Temp(Src) 97.9 F (36.6 C) (Oral)  Ht 5' 7.5" (1.715 m)  Wt 150 lb (68.04 kg)  BMI 23.13 kg/m2  SpO2 94% Gen: NAD, resting comfortably CV: RRR no murmurs rubs or gallops Lungs: CTAB no crackles, wheeze, rhonchi Ext: no edema Skin: warm, dry Back - Normal skin, Spine with normal alignment and no deformity.  No tenderness to vertebral process palpation.  Paraspinous muscles on the right are tender but without spasm.   Range of motion is full at neck and lumbar sacral regions. Negative Straight leg raise.  Neuro- no saddle anesthesia, 5/5 strength lower extremities, 2+ reflexes   Assessment/Plan:  Back pain S: History of low back pain for 4-5 days in the past  then improves. 2-3 weeks ago had blood in urine- went to urology took x-rays and didn't see stones- CT scan was normal  As well(so was not kidney stones). Has follow up 2 months.   Pain going on for at least a month. Worse with bending over, better with walking. Lower right back. 5/10 aching pain. No radiation into legs. Patient reports has had some similar flare ups in the past like this many years ago that responded to prednisone. He cam ein for thyroid tests this morning and prompted him to seek a visit with me.   ROS-No saddle anesthesia, bladder incontinence, fecal incontinence, weakness in extremity, numbness or tingling in extremity (very mild on left). History negative for trauma, history of cancer, fever, chills, unintentional weight loss, recent bacterial infection, recent IV drug use, HIV, pain worse at night or while supine.  A/P: mechanical low back pain- do not think prednisone has high probability of benefiting patient but he has tolerated before and it did help in similar situations so weill rx 7 days 20mg . He will call if not doing better on this and can refer to PT for core strengthening. If persists 3-4 weeks from now, consider ortho referral as would be nearly 2 months.   Meds ordered this encounter  Medications  . predniSONE (DELTASONE) 20 MG tablet    Sig: Take 1 pill daily for 7 days    Dispense:  7 tablet    Refill:  0  established problem worsening with medication management  Return precautions advised.  Garret Reddish, MD

## 2016-04-03 NOTE — Progress Notes (Signed)
Pre visit review using our clinic review tool, if applicable. No additional management support is needed unless otherwise documented below in the visit note. 

## 2016-05-20 DIAGNOSIS — D485 Neoplasm of uncertain behavior of skin: Secondary | ICD-10-CM | POA: Diagnosis not present

## 2016-05-20 DIAGNOSIS — L723 Sebaceous cyst: Secondary | ICD-10-CM | POA: Diagnosis not present

## 2016-05-20 DIAGNOSIS — L821 Other seborrheic keratosis: Secondary | ICD-10-CM | POA: Diagnosis not present

## 2016-05-20 DIAGNOSIS — Z85828 Personal history of other malignant neoplasm of skin: Secondary | ICD-10-CM | POA: Diagnosis not present

## 2016-05-20 DIAGNOSIS — L570 Actinic keratosis: Secondary | ICD-10-CM | POA: Diagnosis not present

## 2016-05-20 DIAGNOSIS — C44311 Basal cell carcinoma of skin of nose: Secondary | ICD-10-CM | POA: Diagnosis not present

## 2016-05-29 ENCOUNTER — Telehealth: Payer: Self-pay | Admitting: Family Medicine

## 2016-05-29 NOTE — Telephone Encounter (Signed)
The physical with Dr. Sherren Mocha has been canceled already.

## 2016-05-29 NOTE — Telephone Encounter (Signed)
Sorry team! I somehow looked at this wrong

## 2016-05-29 NOTE — Telephone Encounter (Signed)
Was reviewing chart and noted that patient had a physical with Dr. Sherren Mocha next February yet per my notes I am his PCP. Can we figure out patient's intention? If he is my patient he should not be using one of Dr. Honor Junes limited physical spots

## 2016-06-03 DIAGNOSIS — N281 Cyst of kidney, acquired: Secondary | ICD-10-CM | POA: Diagnosis not present

## 2016-06-03 DIAGNOSIS — N200 Calculus of kidney: Secondary | ICD-10-CM | POA: Diagnosis not present

## 2016-06-04 ENCOUNTER — Encounter: Payer: Self-pay | Admitting: Family Medicine

## 2016-06-10 MED FILL — POTASSIUM CITRATE ER 10 MEQ: 10 MEQ | 30 days supply | Qty: 120 | Fill #3

## 2016-07-15 DIAGNOSIS — C44311 Basal cell carcinoma of skin of nose: Secondary | ICD-10-CM | POA: Diagnosis not present

## 2016-08-17 ENCOUNTER — Other Ambulatory Visit: Payer: Self-pay | Admitting: Family Medicine

## 2016-09-14 MED FILL — POTASSIUM CITRATE ER 10 MEQ: 10 MEQ | 30 days supply | Qty: 120 | Fill #4

## 2016-09-30 ENCOUNTER — Encounter: Payer: Self-pay | Admitting: Family Medicine

## 2016-09-30 DIAGNOSIS — H25013 Cortical age-related cataract, bilateral: Secondary | ICD-10-CM | POA: Diagnosis not present

## 2016-09-30 DIAGNOSIS — H35363 Drusen (degenerative) of macula, bilateral: Secondary | ICD-10-CM | POA: Diagnosis not present

## 2016-09-30 DIAGNOSIS — H2513 Age-related nuclear cataract, bilateral: Secondary | ICD-10-CM | POA: Diagnosis not present

## 2016-09-30 DIAGNOSIS — H40013 Open angle with borderline findings, low risk, bilateral: Secondary | ICD-10-CM | POA: Diagnosis not present

## 2016-11-16 ENCOUNTER — Encounter: Payer: Medicare Other | Admitting: Family Medicine

## 2016-11-17 ENCOUNTER — Encounter: Payer: Self-pay | Admitting: Family Medicine

## 2016-11-17 ENCOUNTER — Ambulatory Visit (INDEPENDENT_AMBULATORY_CARE_PROVIDER_SITE_OTHER): Payer: Medicare Other | Admitting: Family Medicine

## 2016-11-17 VITALS — BP 112/76 | HR 68 | Temp 97.8°F | Ht 67.5 in | Wt 158.6 lb

## 2016-11-17 DIAGNOSIS — Z0000 Encounter for general adult medical examination without abnormal findings: Secondary | ICD-10-CM | POA: Diagnosis not present

## 2016-11-17 DIAGNOSIS — D510 Vitamin B12 deficiency anemia due to intrinsic factor deficiency: Secondary | ICD-10-CM | POA: Diagnosis not present

## 2016-11-17 DIAGNOSIS — E785 Hyperlipidemia, unspecified: Secondary | ICD-10-CM

## 2016-11-17 DIAGNOSIS — E039 Hypothyroidism, unspecified: Secondary | ICD-10-CM

## 2016-11-17 LAB — CBC
HEMATOCRIT: 45.2 % (ref 39.0–52.0)
Hemoglobin: 15.2 g/dL (ref 13.0–17.0)
MCHC: 33.6 g/dL (ref 30.0–36.0)
MCV: 95.7 fl (ref 78.0–100.0)
Platelets: 181 10*3/uL (ref 150.0–400.0)
RBC: 4.72 Mil/uL (ref 4.22–5.81)
RDW: 13.1 % (ref 11.5–15.5)
WBC: 5.5 10*3/uL (ref 4.0–10.5)

## 2016-11-17 LAB — COMPREHENSIVE METABOLIC PANEL
ALT: 15 U/L (ref 0–53)
AST: 19 U/L (ref 0–37)
Albumin: 4.2 g/dL (ref 3.5–5.2)
Alkaline Phosphatase: 77 U/L (ref 39–117)
BUN: 21 mg/dL (ref 6–23)
CO2: 32 meq/L (ref 19–32)
CREATININE: 1.05 mg/dL (ref 0.40–1.50)
Calcium: 9.4 mg/dL (ref 8.4–10.5)
Chloride: 105 mEq/L (ref 96–112)
GFR: 71.97 mL/min (ref >60.00)
Glucose, Bld: 97 mg/dL (ref 70–99)
POTASSIUM: 5.2 meq/L — AB (ref 3.5–5.1)
SODIUM: 142 meq/L (ref 135–145)
Total Bilirubin: 0.9 mg/dL (ref 0.2–1.2)
Total Protein: 7.1 g/dL (ref 6.0–8.3)

## 2016-11-17 LAB — LIPID PANEL
CHOL/HDL RATIO: 3
Cholesterol: 185 mg/dL (ref 0–200)
HDL: 55.2 mg/dL (ref >39.00)
LDL Cholesterol: 117 mg/dL — ABNORMAL HIGH (ref 0–99)
NonHDL: 129.67
TRIGLYCERIDES: 65 mg/dL (ref 0.0–149.0)
VLDL: 13 mg/dL (ref 0.0–40.0)

## 2016-11-17 LAB — TSH: TSH: 5.64 u[IU]/mL — AB (ref 0.35–4.50)

## 2016-11-17 LAB — VITAMIN B12

## 2016-11-17 NOTE — Progress Notes (Signed)
Pre visit review using our clinic review tool, if applicable. No additional management support is needed unless otherwise documented below in the visit note. 

## 2016-11-17 NOTE — Progress Notes (Signed)
Phone: [phone]  Subjective:  Patient presents today for their annual physical. Chief complaint-noted.   See problem oriented charting- ROS- full  review of systems was completed and negative including No chest pain or shortness of breath. No headache or blurry vision.   The following were reviewed and entered/updated in epic: Past Medical History:  Diagnosis Date  . Cancer ([place])    skin  . Hernia 1987   right inguinal    Patient Active Problem List   Diagnosis Date Noted  . Hypothyroidism 08/02/2013    Priority: Medium  . Pernicious anemia 08/02/2013    Priority: Medium  . Hyperlipidemia 12/20/2007    Priority: Medium  . History of kidney stones 02/03/2016  . SKIN CANCER, HX OF 05/04/2007   Past Surgical History:  Procedure Laterality Date  . HERNIA REPAIR    . MOHS SURGERY     left ear    Family History  Problem Relation Age of Onset  . Benign prostatic hyperplasia Brother     Medications- reviewed and updated Current Outpatient Prescriptions  Medication Sig Dispense Refill  . aspirin 81 MG tablet Take 160 mg by mouth 3 (three) times a week.     . cyanocobalamin (,VITAMIN B-12,) 1000 MCG/ML injection INJECT 1ML INTO MUSCLE EVERY 30 DAYS 30 mL 0  . levothyroxine (SYNTHROID, LEVOTHROID) 75 MCG tablet Take 1 tablet by mouth  daily 90 tablet 3  . potassium citrate (UROCIT-K) 10 MEQ (1080 MG) SR tablet 4 tabs daily 400 tablet 3  . simvastatin (ZOCOR) 20 MG tablet TAKE ONE TABLET BY MOUTH IN THE EVENING 100 tablet 3   No current facility-administered medications for this visit.     Allergies-reviewed and updated No Known Allergies  Social History   Social History  . Marital status: Married    Spouse name: N/A  . Number of children: N/A  . Years of education: N/A   Social History Main Topics  . Smoking status: Never Smoker  . Smokeless tobacco: Never Used  . Alcohol use No  . Drug use: No  . Sexual activity: Not Asked   Other Topics Concern  .  None   Social History Narrative   Married. 2 children. 1 grandchild.       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 3x a week mows with self propelled.       Hobbies: a lot of time at church- building ramps etc, enjoys sports- football, basketball, baseball.     Objective: BP 112/76 (BP Location: Left Arm, Patient Position: Sitting, Cuff Size: Large)   Pulse 68   Temp 97.8 F (36.6 C) (Oral)   Ht 5' 7.5" (1.715 m)   Wt 158 lb 9.6 oz (71.9 kg)   SpO2 97%   BMI 24.47 kg/m  Gen: NAD, resting comfortably HEENT: Mucous membranes are moist. Oropharynx normal Neck: no thyromegaly, no carotid bruits CV: RRR no murmurs rubs or gallops Lungs: CTAB no crackles, wheeze, rhonchi Abdomen: soft/nontender/nondistended/normal bowel sounds. No rebound or guarding.  Ext: no edema Skin: warm, dry Neuro: grossly normal, moves all extremities, PERRLA  Rectal declined  Assessment/Plan:  83 y.o. male presenting for annual physical.  Health Maintenance counseling: 1. Anticipatory guidance: Patient counseled regarding regular dental exams q6 months, eye exams yearly- may need catarct surgery, wearing seatbelts.  2. Risk factor reduction:  Advised patient of need for regular  exercise and diet rich and fruits and vegetables to reduce risk of heart attack and stroke. Exercise- staying very active, not very intentional. 25 hour workweek still. Diet-reasonable- largely stable over last year.  Wt Readings from Last 3 Encounters:  11/17/16 158 lb 9.6 oz (71.9 kg)  04/03/16 150 lb (68 kg)  02/03/16 159 lb (72.1 kg)   3. Immunizations/screenings/ancillary studies Immunization History  Administered Date(s) Administered  . Influenza Split 07/04/2014  . Influenza Whole 10/12/2005, 07/26/2007, 07/12/2008, 07/15/2009, 07/29/2010, 07/12/2013  . Influenza-Unspecified 07/13/2015,  07/24/2016  . Pneumococcal Conjugate-13 08/09/2014  . Pneumococcal Polysaccharide-23 10/12/2002, 07/15/2009  . Td 10/12/2002  . Tetanus 08/02/2013  . Zoster 08/19/2007   4. Prostate cancer screening- would not pursue at patient age  Lab Results  Component Value Date   PSA 0.82 08/09/2014   PSA 0.90 08/02/2013   PSA 0.65 11/03/2011   5. Colon cancer screening - normal 2008, no repeat due to age. No blood in stool.  6. Skin cancer screening- q6 months Dr. Ronnald Ramp  Status of chronic or acute concerns  Hypothyroidism- controlled on levothyroxine 32mcg  HLD- on simvastatin 20mg  and asa 81 mg 3x a week. At age, primary prevention benefit/risk unclear but has tolerated well will continue. Cuts down for short periods if gets muscle aches/weakness  Low b12/pernicious anemia- pays for his own injections as insurance did not cover  Follows with urology on potassium citrate  1 year CPE declines awv  Orders Placed This Encounter  Procedures  . CBC    [place]  . Comprehensive metabolic panel    [place]    Order Specific Question:   Has the patient fasted?    Answer:   No  . Lipid panel    [place]    Order Specific Question:   Has the patient fasted?    Answer:   No  . Vitamin B12  . TSH    [place]   Return precautions advised.   Garret Reddish, MD

## 2016-11-17 NOTE — Patient Instructions (Addendum)
So sorry about your loss  See you back in 1 year  No changes planned  Labs before you go  [place]?  Starting October 1st 2018, I will be transferring to our new location: [address], [place] Phone: [phone]  I would love to have you remain my patient at this new location as long as it remains convenient for you. I am excited about the opportunity to have x-ray and sports medicine in the new building but will really miss the awesome staff and physicians at [place]. Continue to schedule appointments at [facility] and we will automatically transfer them to the [place] location starting October 1st.

## 2016-11-19 ENCOUNTER — Other Ambulatory Visit: Payer: Self-pay

## 2016-11-19 DIAGNOSIS — E039 Hypothyroidism, unspecified: Secondary | ICD-10-CM

## 2016-11-20 ENCOUNTER — Other Ambulatory Visit: Payer: Self-pay

## 2016-11-20 MED ORDER — LEVOTHYROXINE SODIUM 88 MCG PO TABS: 88.0000 ug | ORAL_TABLET | Freq: Every day | ORAL | 3 refills | Status: DC

## 2016-12-02 DIAGNOSIS — D692 Other nonthrombocytopenic purpura: Secondary | ICD-10-CM | POA: Diagnosis not present

## 2016-12-02 DIAGNOSIS — L821 Other seborrheic keratosis: Secondary | ICD-10-CM | POA: Diagnosis not present

## 2016-12-02 DIAGNOSIS — Z85828 Personal history of other malignant neoplasm of skin: Secondary | ICD-10-CM | POA: Diagnosis not present

## 2016-12-02 DIAGNOSIS — L720 Epidermal cyst: Secondary | ICD-10-CM | POA: Diagnosis not present

## 2016-12-02 DIAGNOSIS — L570 Actinic keratosis: Secondary | ICD-10-CM | POA: Diagnosis not present

## 2017-01-04 ENCOUNTER — Other Ambulatory Visit (INDEPENDENT_AMBULATORY_CARE_PROVIDER_SITE_OTHER): Payer: Medicare Other

## 2017-01-04 DIAGNOSIS — E039 Hypothyroidism, unspecified: Secondary | ICD-10-CM

## 2017-01-04 LAB — TSH: TSH: 0.71 u[IU]/mL (ref 0.35–4.50)

## 2017-01-14 DIAGNOSIS — H01003 Unspecified blepharitis right eye, unspecified eyelid: Secondary | ICD-10-CM | POA: Diagnosis not present

## 2017-01-14 DIAGNOSIS — L719 Rosacea, unspecified: Secondary | ICD-10-CM | POA: Diagnosis not present

## 2017-01-14 DIAGNOSIS — H40013 Open angle with borderline findings, low risk, bilateral: Secondary | ICD-10-CM | POA: Diagnosis not present

## 2017-01-14 DIAGNOSIS — L718 Other rosacea: Secondary | ICD-10-CM | POA: Diagnosis not present

## 2017-01-20 DIAGNOSIS — N200 Calculus of kidney: Secondary | ICD-10-CM | POA: Diagnosis not present

## 2017-01-21 DIAGNOSIS — H25013 Cortical age-related cataract, bilateral: Secondary | ICD-10-CM | POA: Diagnosis not present

## 2017-01-21 DIAGNOSIS — H2512 Age-related nuclear cataract, left eye: Secondary | ICD-10-CM | POA: Diagnosis not present

## 2017-01-21 DIAGNOSIS — H40013 Open angle with borderline findings, low risk, bilateral: Secondary | ICD-10-CM | POA: Diagnosis not present

## 2017-01-21 DIAGNOSIS — H2513 Age-related nuclear cataract, bilateral: Secondary | ICD-10-CM | POA: Diagnosis not present

## 2017-01-21 DIAGNOSIS — H35363 Drusen (degenerative) of macula, bilateral: Secondary | ICD-10-CM | POA: Diagnosis not present

## 2017-02-09 DIAGNOSIS — H25812 Combined forms of age-related cataract, left eye: Secondary | ICD-10-CM | POA: Diagnosis not present

## 2017-02-09 DIAGNOSIS — H2512 Age-related nuclear cataract, left eye: Secondary | ICD-10-CM | POA: Diagnosis not present

## 2017-02-17 ENCOUNTER — Encounter: Payer: Self-pay | Admitting: Adult Health

## 2017-02-17 ENCOUNTER — Ambulatory Visit (INDEPENDENT_AMBULATORY_CARE_PROVIDER_SITE_OTHER): Payer: Medicare Other | Admitting: Adult Health

## 2017-02-17 VITALS — BP 102/64 | Ht 67.7 in | Wt 158.8 lb

## 2017-02-17 DIAGNOSIS — L237 Allergic contact dermatitis due to plants, except food: Secondary | ICD-10-CM

## 2017-02-17 MED ORDER — PREDNISONE 10 MG PO TABS: 10.0000 mg | ORAL_TABLET | Freq: Every day | ORAL | 0 refills | Status: DC

## 2017-02-17 MED ORDER — METHYLPREDNISOLONE ACETATE 80 MG/ML IJ SUSP
80.0000 mg | Freq: Once | INTRAMUSCULAR | Status: AC
  Administered 2017-02-17: 80 mg via INTRAMUSCULAR

## 2017-02-17 NOTE — Patient Instructions (Addendum)
It was great meeting you today   I have sent in a prescription for Prednisone to take every morning for 7 days - start tomorrow.   You can continue with the calamine lotion    Poison Ivy Dermatitis Poison ivy dermatitis is redness and soreness (inflammation) of the skin. It is caused by a chemical that is found on the leaves of the poison ivy plant. You may also have itching, a rash, and blisters. Symptoms often clear up in 1-2 weeks. You may get this condition by touching a poison ivy plant. You can also get it by touching something that has the chemical on it. This may include animals or objects that have come in contact with the plant. Follow these instructions at home: General instructions   Take or apply over-the-counter and prescription medicines only as told by your doctor.  If you touch poison ivy, wash your skin with soap and cold water right away.  Use hydrocortisone creams or calamine lotion as needed to help with itching.  Take oatmeal baths as needed. Use colloidal oatmeal. You can get this at a pharmacy or grocery store. Follow the instructions on the package.  Do not scratch or rub your skin.  While you have the rash, wash your clothes right after you wear them. Prevention   Know what poison ivy looks like so you can avoid it. This plant has three leaves with flowering branches on a single stem. The leaves are glossy. They have uneven edges that come to a point at the front.  If you have touched poison ivy, wash with soap and water right away. Be sure to wash under your fingernails.  When hiking or camping, wear long pants, a long-sleeved shirt, tall socks, and hiking boots. You can also use a lotion on your skin that helps to prevent contact with the chemical on the plant.  If you think that your clothes or outdoor gear came in contact with poison ivy, rinse them off with a garden hose before you bring them inside your house. Contact a doctor if:  You have open  sores in the rash area.  You have more redness, swelling, or pain in the affected area.  You have redness that spreads beyond the rash area.  You have fluid, blood, or pus coming from the affected area.  You have a fever.  You have a rash over a large area of your body.  You have a rash on your eyes, mouth, or genitals.  Your rash does not get better after a few days. Get help right away if:  Your face swells or your eyes swell shut.  You have trouble breathing.  You have trouble swallowing. This information is not intended to replace advice given to you by your health care provider. Make sure you discuss any questions you have with your health care provider. Document Released: 10/31/2010 Document Revised: 03/05/2016 Document Reviewed: 03/06/2015 Elsevier Interactive Patient Education  2017 [employer].

## 2017-02-17 NOTE — Progress Notes (Signed)
Subjective:    Patient ID: Chad Fisher, male    DOB: 1933-07-03, 83 y.o.   MRN: [id number]  Poison Chad Fisher  This is a new problem. The current episode started in the past 7 days. The problem has been gradually worsening since onset. The affected locations include the groin, chest, torso, left arm and right arm. The rash is characterized by blistering, redness, swelling and itchiness. Associated with: poison ivy. Pertinent negatives include no facial edema. Past treatments include anti-itch cream (calamine lotion ). The treatment provided mild relief. There is no history of asthma.    Review of Systems See HPI   Past Medical History:  Diagnosis Date  . Cancer ([place])    skin  . Hernia 1987   right inguinal     Social History   Social History  . Marital status: Married    Spouse name: N/A  . Number of children: N/A  . Years of education: N/A   Occupational History  . Not on file.   Social History Main Topics  . Smoking status: Never Smoker  . Smokeless tobacco: Never Used  . Alcohol use No  . Drug use: No  . Sexual activity: Not on file   Other Topics Concern  . Not on file   Social History Narrative   Married. 2 children. 1 grandchild.       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 3x a week mows with self propelled.       Hobbies: a lot of time at church- building ramps etc, enjoys sports- football, basketball, baseball.     Past Surgical History:  Procedure Laterality Date  . HERNIA REPAIR    . MOHS SURGERY     left ear    Family History  Problem Relation Age of Onset  . Benign prostatic hyperplasia Brother     No Known Allergies  Current Outpatient Prescriptions on File Prior to Visit  Medication Sig Dispense Refill  . aspirin 81 MG tablet Take 160 mg by mouth 3 (three) times a week.     . cyanocobalamin (,VITAMIN B-12,)  1000 MCG/ML injection INJECT 1ML INTO MUSCLE EVERY 30 DAYS 30 mL 0  . levothyroxine (SYNTHROID, LEVOTHROID) 88 MCG tablet Take 1 tablet (88 mcg total) by mouth daily. 90 tablet 3  . potassium citrate (UROCIT-K) 10 MEQ (1080 MG) SR tablet 4 tabs daily (Patient taking differently: 2 tabs daily) 400 tablet 3  . simvastatin (ZOCOR) 20 MG tablet TAKE ONE TABLET BY MOUTH IN THE EVENING (Patient taking differently: Take 1 tablet by mouth every other day) 100 tablet 3   No current facility-administered medications on file prior to visit.     BP 102/64 (BP Location: Left Arm, Patient Position: Sitting, Cuff Size: Normal)   Ht 5' 7.7" (1.72 m)   Wt 158 lb 12.8 oz (72 kg)   BMI 24.36 kg/m       Objective:   Physical Exam  Constitutional: He is oriented to person, place, and time. He appears well-developed and well-nourished. No distress.  Neurological: He is alert and oriented to person, place, and time.  Skin: Skin is warm and dry. He is not diaphoretic.  Red, raised rash with small blisters located on bilateral forearms, abdomen, torso and bilateral groin. Rash consistent with dermatitis from poison ivy. No signs of infection noted  Psychiatric: He has  a normal mood and affect. His behavior is normal. Judgment and thought content normal.  Nursing note and vitals reviewed.     Assessment & Plan:  1. Poison ivy dermatitis - Will give IM injection of Depo Medrol and then follow up with 7 days of oral  prednisone to combat any rebound.  - predniSONE (DELTASONE) 10 MG tablet; Take 1 tablet (10 mg total) by mouth daily with breakfast.  Dispense: 7 tablet; Refill: 0 - methylPREDNISolone acetate (DEPO-MEDROL) injection 80 mg; Inject 1 mL (80 mg total) into the muscle once. - Continue to use Calamine lotion - Follow up if not resolved in 3-4 days or sooner if symptoms worsen   Chad Peng, NP

## 2017-02-24 ENCOUNTER — Encounter: Payer: Self-pay | Admitting: Family Medicine

## 2017-02-24 ENCOUNTER — Ambulatory Visit (INDEPENDENT_AMBULATORY_CARE_PROVIDER_SITE_OTHER): Payer: Medicare Other | Admitting: Family Medicine

## 2017-02-24 VITALS — BP 110/70 | HR 67 | Temp 97.9°F | Wt 157.8 lb

## 2017-02-24 DIAGNOSIS — H2511 Age-related nuclear cataract, right eye: Secondary | ICD-10-CM | POA: Diagnosis not present

## 2017-02-24 DIAGNOSIS — L259 Unspecified contact dermatitis, unspecified cause: Secondary | ICD-10-CM | POA: Diagnosis not present

## 2017-02-24 DIAGNOSIS — H25011 Cortical age-related cataract, right eye: Secondary | ICD-10-CM | POA: Diagnosis not present

## 2017-02-24 MED ORDER — PREDNISONE 10 MG PO TABS: ORAL_TABLET | ORAL | 0 refills | Status: DC

## 2017-02-24 NOTE — Progress Notes (Signed)
Subjective:     Patient ID: Chad Fisher, male   DOB: Jan 25, 1933, 83 y.o.   MRN: [id number]  HPI Patient recently seen for contact dermatitis. This was a week ago. He had significant rash on his extremities and trunk. Was given Depo-Medrol 80 mg IM. He was then given 10 mg daily for 7 days. He did see some improvement but has had some persistent rash especially involving upper extremities. Moderate pruritus. Worsened by heat. No fevers or chills.  Past Medical History:  Diagnosis Date  . Cancer ([place])    skin  . Hernia 1987   right inguinal    Past Surgical History:  Procedure Laterality Date  . HERNIA REPAIR    . MOHS SURGERY     left ear    reports that he has never smoked. He has never used smokeless tobacco. He reports that he does not drink alcohol or use drugs. family history includes Benign prostatic hyperplasia in his brother. No Known Allergies   Review of Systems  Constitutional: Negative for chills and fever.  Skin: Positive for rash.       Objective:   Physical Exam  Constitutional: He appears well-developed and well-nourished.  Skin: Rash noted.  Patient has scattered rash on his upper extremities and also right side of trunk with erythematous base and slightly raised vesicular surface. Nontender.       Assessment:     Contact dermatitis    Plan:     -Increase prednisone back to 40 mg daily and taper over the next week -Avoid heat which may exacerbate  Eulas Post MD [facility] at [facility]

## 2017-02-24 NOTE — Patient Instructions (Signed)
Contact Dermatitis Dermatitis is redness, soreness, and swelling (inflammation) of the skin. Contact dermatitis is a reaction to certain substances that touch the skin. There are two types of contact dermatitis:  Irritant contact dermatitis. This type is caused by something that irritates your skin, such as dry hands from washing them too much. This type does not require previous exposure to the substance for a reaction to occur. This type is more common.  Allergic contact dermatitis. This type is caused by a substance that you are allergic to, such as a nickel allergy or poison ivy. This type only occurs if you have been exposed to the substance (allergen) before. Upon a repeat exposure, your body reacts to the substance. This type is less common. What are the causes? Many different substances can cause contact dermatitis. Irritant contact dermatitis is most commonly caused by exposure to:  Makeup.  Soaps.  Detergents.  Bleaches.  Acids.  Metal salts, such as nickel. Allergic contact dermatitis is most commonly caused by exposure to:  Poisonous plants.  Chemicals.  Jewelry.  Latex.  Medicines.  Preservatives in products, such as clothing. What increases the risk? This condition is more likely to develop in:  People who have jobs that expose them to irritants or allergens.  People who have certain medical conditions, such as asthma or eczema. What are the signs or symptoms? Symptoms of this condition may occur anywhere on your body where the irritant has touched you or is touched by you. Symptoms include:  Dryness or flaking.  Redness.  Cracks.  Itching.  Pain or a burning feeling.  Blisters.  Drainage of small amounts of blood or clear fluid from skin cracks. With allergic contact dermatitis, there may also be swelling in areas such as the eyelids, mouth, or genitals. How is this diagnosed? This condition is diagnosed with a medical history and physical exam.  A patch skin test may be performed to help determine the cause. If the condition is related to your job, you may need to see an occupational medicine specialist. How is this treated? Treatment for this condition includes figuring out what caused the reaction and protecting your skin from further contact. Treatment may also include:  Steroid creams or ointments. Oral steroid medicines may be needed in more severe cases.  Antibiotics or antibacterial ointments, if a skin infection is present.  Antihistamine lotion or an antihistamine taken by mouth to ease itching.  A bandage (dressing). Follow these instructions at home: [place] your skin as needed.  Apply cool compresses to the affected areas.  Try taking a bath with:  Epsom salts. Follow the instructions on the packaging. You can get these at your local pharmacy or grocery store.  Baking soda. Pour a small amount into the bath as directed by your health care provider.  Colloidal oatmeal. Follow the instructions on the packaging. You can get this at your local pharmacy or grocery store.  Try applying baking soda paste to your skin. Stir water into baking soda until it reaches a paste-like consistency.  Do not scratch your skin.  Bathe less frequently, such as every other day.  Bathe in lukewarm water. Avoid using hot water. Medicines   Take or apply over-the-counter and prescription medicines only as told by your health care provider.  If you were prescribed an antibiotic medicine, take or apply your antibiotic as told by your health care provider. Do not stop using the antibiotic even if your condition starts to improve. General  instructions   Keep all follow-up visits as told by your health care provider. This is important.  Avoid the substance that caused your reaction. If you do not know what caused it, keep a journal to try to track what caused it. Write down:  What you eat.  What cosmetic products  you use.  What you drink.  What you wear in the affected area. This includes jewelry.  If you were given a dressing, take care of it as told by your health care provider. This includes when to change and remove it. Contact a health care provider if:  Your condition does not improve with treatment.  Your condition gets worse.  You have signs of infection such as swelling, tenderness, redness, soreness, or warmth in the affected area.  You have a fever.  You have new symptoms. Get help right away if:  You have a severe headache, neck pain, or neck stiffness.  You vomit.  You feel very sleepy.  You notice red streaks coming from the affected area.  Your bone or joint underneath the affected area becomes painful after the skin has healed.  The affected area turns darker.  You have difficulty breathing. This information is not intended to replace advice given to you by your health care provider. Make sure you discuss any questions you have with your health care provider. Document Released: 09/25/2000 Document Revised: 03/05/2016 Document Reviewed: 02/13/2015 Elsevier Interactive Patient Education  2017 [employer].

## 2017-03-02 DIAGNOSIS — H2511 Age-related nuclear cataract, right eye: Secondary | ICD-10-CM | POA: Diagnosis not present

## 2017-03-02 DIAGNOSIS — H25811 Combined forms of age-related cataract, right eye: Secondary | ICD-10-CM | POA: Diagnosis not present

## 2017-06-02 DIAGNOSIS — C44319 Basal cell carcinoma of skin of other parts of face: Secondary | ICD-10-CM | POA: Diagnosis not present

## 2017-06-02 DIAGNOSIS — D485 Neoplasm of uncertain behavior of skin: Secondary | ICD-10-CM | POA: Diagnosis not present

## 2017-06-02 DIAGNOSIS — C4401 Basal cell carcinoma of skin of lip: Secondary | ICD-10-CM | POA: Diagnosis not present

## 2017-06-02 DIAGNOSIS — L570 Actinic keratosis: Secondary | ICD-10-CM | POA: Diagnosis not present

## 2017-06-02 DIAGNOSIS — L821 Other seborrheic keratosis: Secondary | ICD-10-CM | POA: Diagnosis not present

## 2017-06-02 DIAGNOSIS — Z85828 Personal history of other malignant neoplasm of skin: Secondary | ICD-10-CM | POA: Diagnosis not present

## 2017-06-02 DIAGNOSIS — L723 Sebaceous cyst: Secondary | ICD-10-CM | POA: Diagnosis not present

## 2017-08-06 ENCOUNTER — Other Ambulatory Visit: Payer: Self-pay | Admitting: Family Medicine

## 2017-09-15 DIAGNOSIS — H43813 Vitreous degeneration, bilateral: Secondary | ICD-10-CM | POA: Diagnosis not present

## 2017-09-15 DIAGNOSIS — H40013 Open angle with borderline findings, low risk, bilateral: Secondary | ICD-10-CM | POA: Diagnosis not present

## 2017-09-15 DIAGNOSIS — Z961 Presence of intraocular lens: Secondary | ICD-10-CM | POA: Diagnosis not present

## 2017-09-15 DIAGNOSIS — H35363 Drusen (degenerative) of macula, bilateral: Secondary | ICD-10-CM | POA: Diagnosis not present

## 2017-10-19 DIAGNOSIS — L570 Actinic keratosis: Secondary | ICD-10-CM | POA: Diagnosis not present

## 2017-10-19 DIAGNOSIS — L720 Epidermal cyst: Secondary | ICD-10-CM | POA: Diagnosis not present

## 2017-10-19 DIAGNOSIS — D0439 Carcinoma in situ of skin of other parts of face: Secondary | ICD-10-CM | POA: Diagnosis not present

## 2017-10-19 DIAGNOSIS — Z85828 Personal history of other malignant neoplasm of skin: Secondary | ICD-10-CM | POA: Diagnosis not present

## 2017-10-19 DIAGNOSIS — L821 Other seborrheic keratosis: Secondary | ICD-10-CM | POA: Diagnosis not present

## 2017-10-19 DIAGNOSIS — D0410 Carcinoma in situ of skin of unspecified eyelid, including canthus: Secondary | ICD-10-CM | POA: Diagnosis not present

## 2017-11-17 ENCOUNTER — Ambulatory Visit (INDEPENDENT_AMBULATORY_CARE_PROVIDER_SITE_OTHER): Payer: Medicare Other | Admitting: Family Medicine

## 2017-11-17 ENCOUNTER — Encounter: Payer: Self-pay | Admitting: Family Medicine

## 2017-11-17 VITALS — BP 110/70 | HR 56 | Temp 98.3°F | Ht 67.0 in | Wt 156.8 lb

## 2017-11-17 DIAGNOSIS — E785 Hyperlipidemia, unspecified: Secondary | ICD-10-CM

## 2017-11-17 DIAGNOSIS — Z0000 Encounter for general adult medical examination without abnormal findings: Secondary | ICD-10-CM

## 2017-11-17 DIAGNOSIS — E538 Deficiency of other specified B group vitamins: Secondary | ICD-10-CM | POA: Diagnosis not present

## 2017-11-17 DIAGNOSIS — E7800 Pure hypercholesterolemia, unspecified: Secondary | ICD-10-CM

## 2017-11-17 DIAGNOSIS — E039 Hypothyroidism, unspecified: Secondary | ICD-10-CM

## 2017-11-17 DIAGNOSIS — D692 Other nonthrombocytopenic purpura: Secondary | ICD-10-CM | POA: Diagnosis not present

## 2017-11-17 LAB — LIPID PANEL
CHOLESTEROL: 195 mg/dL (ref 0–200)
HDL: 46.5 mg/dL (ref >39.00)
LDL Cholesterol: 135 mg/dL — ABNORMAL HIGH (ref 0–99)
NonHDL: 148.38
TRIGLYCERIDES: 68 mg/dL (ref 0.0–149.0)
Total CHOL/HDL Ratio: 4
VLDL: 13.6 mg/dL (ref 0.0–40.0)

## 2017-11-17 LAB — CBC
HCT: 44.8 % (ref 39.0–52.0)
Hemoglobin: 15.2 g/dL (ref 13.0–17.0)
MCHC: 34 g/dL (ref 30.0–36.0)
MCV: 95 fl (ref 78.0–100.0)
Platelets: 167 10*3/uL (ref 150.0–400.0)
RBC: 4.72 Mil/uL (ref 4.22–5.81)
RDW: 13.4 % (ref 11.5–15.5)
WBC: 5.6 10*3/uL (ref 4.0–10.5)

## 2017-11-17 LAB — COMPREHENSIVE METABOLIC PANEL
ALBUMIN: 4 g/dL (ref 3.5–5.2)
ALK PHOS: 85 U/L (ref 39–117)
ALT: 13 U/L (ref 0–53)
AST: 18 U/L (ref 0–37)
BILIRUBIN TOTAL: 0.8 mg/dL (ref 0.2–1.2)
BUN: 18 mg/dL (ref 6–23)
CALCIUM: 9.3 mg/dL (ref 8.4–10.5)
CO2: 33 mEq/L — ABNORMAL HIGH (ref 19–32)
CREATININE: 1.13 mg/dL (ref 0.40–1.50)
Chloride: 104 mEq/L (ref 96–112)
GFR: 65.96 mL/min (ref >60.00)
Glucose, Bld: 101 mg/dL — ABNORMAL HIGH (ref 70–99)
Potassium: 5.2 mEq/L — ABNORMAL HIGH (ref 3.5–5.1)
Sodium: 141 mEq/L (ref 135–145)
TOTAL PROTEIN: 7.1 g/dL (ref 6.0–8.3)

## 2017-11-17 LAB — VITAMIN B12: VITAMIN B 12: 200 pg/mL — AB (ref 211–911)

## 2017-11-17 LAB — TSH: TSH: 0.31 u[IU]/mL — AB (ref 0.35–4.50)

## 2017-11-17 MED ORDER — SIMVASTATIN 20 MG PO TABS: ORAL_TABLET | ORAL | 3 refills | Status: DC

## 2017-11-17 NOTE — Assessment & Plan Note (Signed)
Easy bruising bleeding- often on forearms- even with slightly bumping it. He is on aspirin 81mg  3x a week which we will cut out.

## 2017-11-17 NOTE — Patient Instructions (Addendum)
Please stop by lab before you go  Consider Shingrix at your pharmacy if you can find it. Also ok to wait since you have had zostavax back in 2008  You may stop the aspirin

## 2017-11-17 NOTE — Progress Notes (Signed)
Phone: [phone]  Subjective:  Patient presents today for their annual physical. Chief complaint-noted.   See problem oriented charting- ROS- full  review of systems was completed and negative except for: occasional left elbow pain around bedtime if has been active that day- no exertional pain in the arm.   The following were reviewed and entered/updated in epic: Past Medical History:  Diagnosis Date  . Cancer ([place])    skin  . Hernia 1987   right inguinal    Patient Active Problem List   Diagnosis Date Noted  . Hypothyroidism 08/02/2013    Priority: Medium  . Pernicious anemia 08/02/2013    Priority: Medium  . Hyperlipidemia 12/20/2007    Priority: Medium  . Senile purpura ([place]) 11/17/2017  . History of kidney stones 02/03/2016  . SKIN CANCER, HX OF 05/04/2007   Past Surgical History:  Procedure Laterality Date  . CATARACT EXTRACTION, BILATERAL    . HERNIA REPAIR    . MOHS SURGERY     left ear    Family History  Problem Relation Age of Onset  . Benign prostatic hyperplasia Brother     Medications- reviewed and updated Current Outpatient Medications  Medication Sig Dispense Refill  . cyanocobalamin (,VITAMIN B-12,) 1000 MCG/ML injection INJECT 1ML INTO MUSCLE EVERY 30 DAYS 30 mL 0  . levothyroxine (SYNTHROID, LEVOTHROID) 88 MCG tablet TAKE 1 TABLET BY MOUTH  DAILY 90 tablet 3  . potassium citrate (UROCIT-K) 10 MEQ (1080 MG) SR tablet 4 tabs daily (Patient taking differently: 2 tabs daily) 400 tablet 3  . simvastatin (ZOCOR) 20 MG tablet Take 1 tablet by mouth every other day 50 tablet 3   No current facility-administered medications for this visit.     Allergies-reviewed and updated No Known Allergies  Social History   Social History Narrative   Married. 2 children. 1 grandchild.       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 3x a week mows with self propelled.       Hobbies: a lot of time at church- building ramps etc, enjoys sports- football, basketball, baseball.     Objective: BP 110/70 (BP Location: Left Arm, Patient Position: Sitting, Cuff Size: Large)   Pulse (!) 56   Temp 98.3 F (36.8 C) (Oral)   Ht 5\' 7"  (1.702 m)   Wt 156 lb 12.8 oz (71.1 kg)   SpO2 97%   BMI 24.56 kg/m  Gen: NAD, resting comfortably HEENT: Mucous membranes are moist. Oropharynx normal Neck: no thyromegaly, no cervical lymphadenopathy CV: RRR no murmurs rubs or gallops Lungs: CTAB no crackles, wheeze, rhonchi Abdomen: soft/nontender/nondistended/normal bowel sounds. No rebound or guarding.  Ext: no edema Skin: warm, dry Neuro: grossly normal, moves all extremities, PERRLA  Assessment/Plan:  84 y.o. male presenting for annual physical.  Health Maintenance counseling: 1. Anticipatory guidance: Patient counseled regarding regular dental exams -q6 months, eye exams -yearly- has had catract surgery, wearing seatbelts.  2. Risk factor reduction:  Advised patient of need for regular exercise and diet rich and fruits and vegetables to reduce risk of heart attack and stroke. Exercise- staying very active. Diet-very reasonable.  Wt Readings from Last 3 Encounters:  11/17/17 156 lb 12.8 oz (71.1 kg)  02/24/17 157 lb 12.8 oz (71.6 kg)  02/17/17 158 lb 12.8 oz (72 kg)  3. Immunizations/screenings/ancillary studies- discussed shingrix availability issues Immunization History  Administered Date(s) Administered  . Influenza Split 07/04/2014  . Influenza Whole 10/12/2005, 07/26/2007, 07/12/2008, 07/15/2009, 07/29/2010, 07/12/2013  . Influenza-Unspecified 07/13/2015, 07/24/2016, 07/13/2017  . Pneumococcal Conjugate-13 08/09/2014  . Pneumococcal Polysaccharide-23 10/12/2002, 07/15/2009  . Td 10/12/2002  . Tetanus 08/02/2013  . Zoster 08/19/2007  4. Prostate cancer screening-  we are no longer screening due to age. Mild increase  in urination- he sees urology and they check urines.  Lab Results  Component Value Date   PSA 0.82 08/09/2014   PSA 0.90 08/02/2013   PSA 0.65 11/03/2011   5. Colon cancer screening - no blood in stool. Last colonoscopy 2008 with no repeat due to age 52. Skin cancer screening- q6 months with Dr. Ronnald Ramp. advised regular sunscreen use. Denies worrisome, changing, or new skin lesions.   Status of chronic or acute concerns   Hypothyroidism- last year increased to 75 mcg and later 88 mcg. Will update TSh today Lab Results  Component Value Date   TSH 0.71 01/04/2017   Hyperlipidemia- he is on simvastatin 20mg  and asa 81mg - 3x a week each. He has tolerated dose well so we will continue though primary prevention risk/benefit unclear. He wants to cut out aspirin due to bleeding with dental work and easy bruising  Low b12- continues to give monthly injections. Has to pay for this himself as insurance doesn't cover  On potassium citrate through [employer]. UAs through urology. Down to 1 potassium citrate a day.  Senile purpura ([facility]) Easy bruising bleeding- often on forearms- even with slightly bumping it. He is on aspirin 81mg  3x a week which we will cut out.   No Follow-up on file.  Lab/Order associations: Preventative health care  Hypothyroidism, unspecified type - Plan: CBC, Comprehensive metabolic panel, Lipid panel, TSH  Hyperlipidemia, unspecified hyperlipidemia type - Plan: CBC, Comprehensive metabolic panel, Lipid panel  B12 deficiency - Plan: Vitamin B12  Meds ordered this encounter  Medications  . simvastatin (ZOCOR) 20 MG tablet    Sig: Take 1 tablet by mouth every other day    Dispense:  50 tablet    Refill:  3   Return precautions advised.  Garret Reddish, MD

## 2017-11-18 ENCOUNTER — Telehealth: Payer: Self-pay | Admitting: Family Medicine

## 2017-11-18 NOTE — Telephone Encounter (Signed)
Spoke to pt, told him to resume vit B12 Injections every 4 weeks per Dr. Yong Channel. Pt verbalized understanding.

## 2017-11-18 NOTE — Telephone Encounter (Signed)
Please advise 

## 2017-11-25 ENCOUNTER — Telehealth: Payer: Self-pay | Admitting: Family Medicine

## 2017-11-25 NOTE — Telephone Encounter (Signed)
Pt requesting prescription for Levothyroxine 69mcg to be sent to [facility]. Previoius prescription is for 54mcg but pt states his dosage was decreased.  Pt states prescription was supposed to be sent in on Monday 11/22/17

## 2017-11-25 NOTE — Telephone Encounter (Signed)
See note

## 2017-11-25 NOTE — Telephone Encounter (Signed)
Copied from [place] [id number]. Topic: Quick Communication - Rx Refill/Question >> Nov 25, 2017  3:58 PM Bennye Alm wrote: Medication: levothyroxine (SYNTHROID, LEVOTHROID) (cut back to 075)  Has the patient contacted their pharmacy? Yes.  Per patient, nothing received yet   (Agent: If no, request that the patient contact the pharmacy for the refill.)   Preferred Pharmacy (with phone number or street name): [facility], [facility]  Medication was supposed to be sent in on Monday 11/22/16 during is appt.  Agent: Please be advised that RX refills may take up to 3 business days. We ask that you follow-up with your pharmacy.

## 2017-11-26 ENCOUNTER — Other Ambulatory Visit: Payer: Self-pay

## 2017-11-26 DIAGNOSIS — E039 Hypothyroidism, unspecified: Secondary | ICD-10-CM

## 2017-11-26 MED ORDER — LEVOTHYROXINE SODIUM 75 MCG PO TABS: 75.0000 ug | ORAL_TABLET | Freq: Every day | ORAL | 3 refills | Status: DC

## 2017-11-26 NOTE — Telephone Encounter (Signed)
Per the lab results dated 11/17/17:  Thyroid is very slightly over treated- lets go back to 75 mcg and Jamie please set up 6 week repeat TSH.   I will call patient and review. I will also enter order for repeat lab work and schedule lab appointment

## 2017-11-26 NOTE — Telephone Encounter (Signed)
Medication is sent to the pharmacy and patient is aware

## 2017-11-26 NOTE — Telephone Encounter (Signed)
Dr. Yong Channel, please advise what dosage of Levothyroxine pt is suppose to be taking he said dosage was decreased. 88 mcg or 75 mcg?

## 2018-01-03 ENCOUNTER — Other Ambulatory Visit (INDEPENDENT_AMBULATORY_CARE_PROVIDER_SITE_OTHER): Payer: Medicare Other

## 2018-01-03 DIAGNOSIS — E039 Hypothyroidism, unspecified: Secondary | ICD-10-CM

## 2018-01-03 LAB — TSH: TSH: 0.96 u[IU]/mL (ref 0.35–4.50)

## 2018-01-24 ENCOUNTER — Encounter: Payer: Self-pay | Admitting: Family Medicine

## 2018-01-24 DIAGNOSIS — N200 Calculus of kidney: Secondary | ICD-10-CM | POA: Diagnosis not present

## 2018-01-24 DIAGNOSIS — H40013 Open angle with borderline findings, low risk, bilateral: Secondary | ICD-10-CM | POA: Diagnosis not present

## 2018-01-24 DIAGNOSIS — L718 Other rosacea: Secondary | ICD-10-CM | POA: Diagnosis not present

## 2018-01-24 DIAGNOSIS — H01009 Unspecified blepharitis unspecified eye, unspecified eyelid: Secondary | ICD-10-CM | POA: Diagnosis not present

## 2018-01-24 DIAGNOSIS — H01003 Unspecified blepharitis right eye, unspecified eyelid: Secondary | ICD-10-CM | POA: Diagnosis not present

## 2018-01-25 ENCOUNTER — Encounter: Payer: Self-pay | Admitting: Family Medicine

## 2018-01-25 LAB — URINALYSIS, DIPSTICK ONLY
Bilirubin: NEGATIVE
GLUCOSE: NEGATIVE
KETONE: NEGATIVE
Leukocyte Esterase: NEGATIVE
Nitrites: NEGATIVE
Specific Gravity: 1.02
Urobilinogen, UA: NORMAL

## 2018-03-12 ENCOUNTER — Other Ambulatory Visit: Payer: Self-pay | Admitting: Family Medicine

## 2018-03-12 DIAGNOSIS — D510 Vitamin B12 deficiency anemia due to intrinsic factor deficiency: Secondary | ICD-10-CM

## 2018-03-14 NOTE — Telephone Encounter (Signed)
Dr Ansel Bong pt

## 2018-04-19 DIAGNOSIS — D0461 Carcinoma in situ of skin of right upper limb, including shoulder: Secondary | ICD-10-CM | POA: Diagnosis not present

## 2018-04-19 DIAGNOSIS — L820 Inflamed seborrheic keratosis: Secondary | ICD-10-CM | POA: Diagnosis not present

## 2018-04-19 DIAGNOSIS — L570 Actinic keratosis: Secondary | ICD-10-CM | POA: Diagnosis not present

## 2018-04-19 DIAGNOSIS — D485 Neoplasm of uncertain behavior of skin: Secondary | ICD-10-CM | POA: Diagnosis not present

## 2018-04-19 DIAGNOSIS — L821 Other seborrheic keratosis: Secondary | ICD-10-CM | POA: Diagnosis not present

## 2018-04-19 DIAGNOSIS — Z85828 Personal history of other malignant neoplasm of skin: Secondary | ICD-10-CM | POA: Diagnosis not present

## 2018-04-19 DIAGNOSIS — D0439 Carcinoma in situ of skin of other parts of face: Secondary | ICD-10-CM | POA: Diagnosis not present

## 2018-04-19 DIAGNOSIS — L723 Sebaceous cyst: Secondary | ICD-10-CM | POA: Diagnosis not present

## 2018-06-17 ENCOUNTER — Other Ambulatory Visit: Payer: Self-pay

## 2018-06-17 MED ORDER — CYANOCOBALAMIN 1000 MCG/ML IJ SOLN: INTRAMUSCULAR | 0 refills | Status: DC

## 2018-06-23 ENCOUNTER — Ambulatory Visit: Payer: Medicare Other

## 2018-07-08 ENCOUNTER — Ambulatory Visit (INDEPENDENT_AMBULATORY_CARE_PROVIDER_SITE_OTHER): Payer: Medicare Other

## 2018-07-08 VITALS — BP 110/82 | HR 54 | Temp 97.9°F | Ht 67.0 in | Wt 152.8 lb

## 2018-07-08 DIAGNOSIS — Z0000 Encounter for general adult medical examination without abnormal findings: Secondary | ICD-10-CM

## 2018-07-08 NOTE — Progress Notes (Signed)
PCP notes: Last OV 11/17/2017   Health maintenance: Up to Date   Abnormal screenings: None   Patient concerns: None   Nurse concerns:None   Next PCP appt: 11/18/2018

## 2018-07-08 NOTE — Progress Notes (Signed)
Subjective:   Chad Fisher is a 84 y.o. male who presents for an Initial Medicare Annual Wellness Visit.  Review of Systems  No ROS.  Medicare Wellness Visit. Additional risk factors are reflected in the social history.  Cardiac Risk Factors include: advanced age (>61men, >37 women);male gender Patient married to wife of 37 years. Live sin a 2 story home. Enjoys watching sports, working part time still, teaches Sunday School. Active in committees at [employer], [place] to bed around 10:30-12. Occasionally gets up once a night to go to the bathroom. Gets up around 5:40 on days he works and around 7:30 on days he doesn't work. Feels rested when he wakes up. No CPAP   Objective:    Today's Vitals   07/08/18 1416  BP: 110/82  Pulse: (!) 54  Temp: 97.9 F (36.6 C)  TempSrc: Oral  SpO2: 96%  Weight: 152 lb 12.8 oz (69.3 kg)  Height: 5\' 7"  (1.702 m)   Body mass index is 23.93 kg/m.  Advanced Directives 07/08/2018  Does Patient Have a Medical Advance Directive? Yes  Type of Paramedic of [place];Living will  Does patient want to make changes to medical advance directive? No - Patient declined  Copy of [place] in Chart? No - copy requested    Current Medications (verified) Outpatient Encounter Medications as of 07/08/2018  Medication Sig  . cyanocobalamin (,VITAMIN B-12,) 1000 MCG/ML injection INJECT 1ML INTO MUSCLE EVERY 30 DAYS  . levothyroxine (SYNTHROID, LEVOTHROID) 75 MCG tablet Take 1 tablet (75 mcg total) by mouth daily.  . potassium citrate (UROCIT-K) 10 MEQ (1080 MG) SR tablet 4 tabs daily (Patient taking differently: 2 tabs daily)  . simvastatin (ZOCOR) 20 MG tablet Take 1 tablet by mouth every other day   No facility-administered encounter medications on file as of 07/08/2018.     Allergies (verified) Patient has no known allergies.   History: Past Medical History:  Diagnosis Date  . Cancer ([place])    skin  . Hernia 1987   right inguinal    Past Surgical History:  Procedure Laterality Date  . CATARACT EXTRACTION, BILATERAL    . HERNIA REPAIR    . MOHS SURGERY     left ear   Family History  Problem Relation Age of Onset  . Anemia Father   . Benign prostatic hyperplasia Brother   . Cancer Daughter   . Breast cancer Daughter    Social History   Socioeconomic History  . Marital status: Married    Spouse name: Not on file  . Number of children: Not on file  . Years of education: Not on file  . Highest education level: Not on file  Occupational History  . Not on file  Social Needs  . Financial resource strain: Not on file  . Food insecurity:    Worry: Not on file    Inability: Not on file  . Transportation needs:    Medical: Not on file    Non-medical: Not on file  Tobacco Use  . Smoking status: Never Smoker  . Smokeless tobacco: Never Used  Substance and Sexual Activity  . Alcohol use: No    Alcohol/week: 0.0 standard drinks  . Drug use: No  . Sexual activity: Not on file  Lifestyle  . Physical activity:    Days per week: Not on file    Minutes per session: Not on file  . Stress: Not on file  Relationships  .  Social connections:    Talks on phone: Not on file    Gets together: Not on file    Attends religious service: Not on file    Active member of club or organization: Not on file    Attends meetings of clubs or organizations: Not on file    Relationship status: Not on file  Other Topics Concern  . Not on file  Social History Narrative   Married. 2 children. 1 grandchild.       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 3x a week mows with self propelled.       Hobbies: a lot of time at church- building ramps etc, enjoys sports- football, basketball, baseball.    Tobacco Counseling Counseling given: Not Answered        Activities of Daily Living In your present state of health, do you have any difficulty performing the following activities: 07/08/2018  Hearing? N  Vision? N  Difficulty concentrating or making decisions? N  Walking or climbing stairs? N  Dressing or bathing? N  Doing errands, shopping? N  Preparing Food and eating ? N  Using the Toilet? N  In the past six months, have you accidently leaked urine? N  Do you have problems with loss of bowel control? N  Managing your Medications? N  Managing your Finances? N  Housekeeping or managing your Housekeeping? N  Some recent data might be hidden     Immunizations and Health Maintenance Immunization History  Administered Date(s) Administered  . Influenza Split 07/04/2014  . Influenza Whole 10/12/2005, 07/26/2007, 07/12/2008, 07/15/2009, 07/29/2010, 07/12/2013  . Influenza-Unspecified 07/13/2015, 07/24/2016, 07/13/2017, 07/02/2018  . Pneumococcal Conjugate-13 08/09/2014  . Pneumococcal Polysaccharide-23 10/12/2002, 07/15/2009  . Td 10/12/2002  . Tetanus 08/02/2013  . Zoster 08/19/2007   There are no preventive care reminders to display for this patient.  Patient Care Team: Marin Olp, MD as PCP - General (Family Medicine)  Indicate any recent Medical Services you may have received from other than [facility] providers in the past year (date may be approximate).    Assessment:   This is a routine wellness examination for Chad Fisher.  Hearing/Vision screen  Visual Acuity Screening   Right eye Left eye Both eyes  Without correction: 20/25 20/25 20/25   With correction:     Comments: Dr. Pandora Leiter  Hearing Screening Comments: Refer Bilaterally Discussed going to the [place] for Hearing Screening or [employer]  Dietary issues and exercise activities discussed: Current Exercise Habits: The patient does not participate in regular exercise at present, Exercise limited by: None identified   Breakfast: eggs, bacon, toast, bagel, orange juice or green  tea  Lunch: sometimes skips, hamburger, drinks a sweet tea or a regular soda  Diner: Spaghetti with sweet tea, eats out about 75% of the time.  Goals    . Patient Stated     Patient wishes to stay as active as he is now      Depression Screen PHQ 2/9 Scores 07/08/2018 11/17/2016 11/12/2015 08/09/2014  PHQ - 2 Score 0 0 0 0    Fall Risk Fall Risk  07/08/2018 11/17/2016 11/12/2015 08/09/2014  Falls in the past year? No No No No     6CIT Screen 07/08/2018  What Year? 0 points  What month? 0 points  What time? 0 points  Count back from 20 0 points  Months in  reverse 0 points  Repeat phrase 0 points  Total Score 0    Screening Tests Health Maintenance  Topic Date Due  . TETANUS/TDAP  08/03/2023  . INFLUENZA VACCINE  Completed  . PNA vac Low Risk Adult  Completed        Plan:    Follow UP with PCP as advised  I have personally reviewed and noted the following in the patient's chart:   . Medical and social history . Use of alcohol, tobacco or illicit drugs  . Current medications and supplements . Functional ability and status . Nutritional status . Physical activity . Advanced directives . List of other physicians . Vitals . Screenings to include cognitive, depression, and falls . Referrals and appointments  In addition, I have reviewed and discussed with patient certain preventive protocols, quality metrics, and best practice recommendations. A written personalized care plan for preventive services as well as general preventive health recommendations were provided to patient.     [place], [place]   9/67/5916

## 2018-07-08 NOTE — Progress Notes (Signed)
I have reviewed and agree with note, evaluation, plan.   Larry Alcock, MD  

## 2018-07-08 NOTE — Patient Instructions (Signed)
There are no preventive care reminders to display for this patient.   Chad Fisher , Thank you for taking time to come for your Medicare Wellness Visit. I appreciate your ongoing commitment to your health goals. Please review the following plan we discussed and let me know if I can assist you in the future.   These are the goals we discussed: Goals    . Patient Stated     Patient wishes to stay as active as he is now       This is a list of the screening recommended for you and due dates:  Health Maintenance  Topic Date Due  . Tetanus Vaccine  08/03/2023  . Flu Shot  Completed  . Pneumonia vaccines  Completed    Preventive Care for Adults  A healthy lifestyle and preventive care can promote health and wellness. Preventive health guidelines for adults include the following key practices.  . A routine yearly physical is a good way to check with your health care provider about your health and preventive screening. It is a chance to share any concerns and updates on your health and to receive a thorough exam.  . Visit your dentist for a routine exam and preventive care every 6 months. Brush your teeth twice a day and floss once a day. Good oral hygiene prevents tooth decay and gum disease.  . The frequency of eye exams is based on your age, health, family medical history, use  of contact lenses, and other factors. Follow your health care provider's recommendations for frequency of eye exams.  . Eat a healthy diet. Foods like vegetables, fruits, whole grains, low-fat dairy products, and lean protein foods contain the nutrients you need without too many calories. Decrease your intake of foods high in solid fats, added sugars, and salt. Eat the right amount of calories for you. Get information about a proper diet from your health care provider, if necessary.  . Regular physical exercise is one of the most important things you can do for your health. Most adults should get at least 150 minutes  of moderate-intensity exercise (any activity that increases your heart rate and causes you to sweat) each week. In addition, most adults need muscle-strengthening exercises on 2 or more days a week.  Silver Sneakers may be a benefit available to you. To determine eligibility, you may visit the website: www.silversneakers.com or contact program at [phone] Mon-Fri between 8AM-8PM.   . Maintain a healthy weight. The body mass index (BMI) is a screening tool to identify possible weight problems. It provides an estimate of body fat based on height and weight. Your health care provider can find your BMI and can help you achieve or maintain a healthy weight.   For adults 20 years and older: ? A BMI below 18.5 is considered underweight. ? A BMI of 18.5 to 24.9 is normal. ? A BMI of 25 to 29.9 is considered overweight. ? A BMI of 30 and above is considered obese.   . Maintain normal blood lipids and cholesterol levels by exercising and minimizing your intake of saturated fat. Eat a balanced diet with plenty of fruit and vegetables. Blood tests for lipids and cholesterol should begin at age 66 and be repeated every 5 years. If your lipid or cholesterol levels are high, you are over 50, or you are at high risk for heart disease, you may need your cholesterol levels checked more frequently. Ongoing high lipid and cholesterol levels should be treated with medicines  if diet and exercise are not working.  . If you smoke, find out from your health care provider how to quit. If you do not use tobacco, please do not start.  . If you choose to drink alcohol, please do not consume more than 2 drinks per day. One drink is considered to be 12 ounces (355 mL) of beer, 5 ounces (148 mL) of wine, or 1.5 ounces (44 mL) of liquor.  . If you are 1-44 years old, ask your health care provider if you should take aspirin to prevent strokes.  . Use sunscreen. Apply sunscreen liberally and repeatedly throughout the day.  You should seek shade when your shadow is shorter than you. Protect yourself by wearing long sleeves, pants, a wide-brimmed hat, and sunglasses year round, whenever you are outdoors.  . Once a month, do a whole body skin exam, using a mirror to look at the skin on your back. Tell your health care provider of new moles, moles that have irregular borders, moles that are larger than a pencil eraser, or moles that have changed in shape or color.

## 2018-09-12 DIAGNOSIS — H40013 Open angle with borderline findings, low risk, bilateral: Secondary | ICD-10-CM | POA: Diagnosis not present

## 2018-09-12 DIAGNOSIS — H35363 Drusen (degenerative) of macula, bilateral: Secondary | ICD-10-CM | POA: Diagnosis not present

## 2018-09-12 DIAGNOSIS — H43813 Vitreous degeneration, bilateral: Secondary | ICD-10-CM | POA: Diagnosis not present

## 2018-09-12 DIAGNOSIS — Z961 Presence of intraocular lens: Secondary | ICD-10-CM | POA: Diagnosis not present

## 2018-10-19 DIAGNOSIS — Z85828 Personal history of other malignant neoplasm of skin: Secondary | ICD-10-CM | POA: Diagnosis not present

## 2018-10-19 DIAGNOSIS — L570 Actinic keratosis: Secondary | ICD-10-CM | POA: Diagnosis not present

## 2018-10-19 DIAGNOSIS — L821 Other seborrheic keratosis: Secondary | ICD-10-CM | POA: Diagnosis not present

## 2018-11-18 ENCOUNTER — Ambulatory Visit (INDEPENDENT_AMBULATORY_CARE_PROVIDER_SITE_OTHER): Payer: Medicare Other | Admitting: Family Medicine

## 2018-11-18 ENCOUNTER — Encounter: Payer: Self-pay | Admitting: Family Medicine

## 2018-11-18 VITALS — BP 102/70 | HR 67 | Temp 97.7°F | Ht 65.5 in | Wt 157.8 lb

## 2018-11-18 DIAGNOSIS — D510 Vitamin B12 deficiency anemia due to intrinsic factor deficiency: Secondary | ICD-10-CM

## 2018-11-18 DIAGNOSIS — E039 Hypothyroidism, unspecified: Secondary | ICD-10-CM | POA: Diagnosis not present

## 2018-11-18 DIAGNOSIS — D692 Other nonthrombocytopenic purpura: Secondary | ICD-10-CM | POA: Diagnosis not present

## 2018-11-18 DIAGNOSIS — R944 Abnormal results of kidney function studies: Secondary | ICD-10-CM

## 2018-11-18 DIAGNOSIS — E785 Hyperlipidemia, unspecified: Secondary | ICD-10-CM | POA: Diagnosis not present

## 2018-11-18 DIAGNOSIS — Z6825 Body mass index (BMI) 25.0-25.9, adult: Secondary | ICD-10-CM

## 2018-11-18 DIAGNOSIS — Z0000 Encounter for general adult medical examination without abnormal findings: Secondary | ICD-10-CM

## 2018-11-18 LAB — COMPREHENSIVE METABOLIC PANEL
ALK PHOS: 76 U/L (ref 39–117)
ALT: 13 U/L (ref 0–53)
AST: 16 U/L (ref 0–37)
Albumin: 4 g/dL (ref 3.5–5.2)
BILIRUBIN TOTAL: 0.9 mg/dL (ref 0.2–1.2)
BUN: 18 mg/dL (ref 6–23)
CALCIUM: 8.9 mg/dL (ref 8.4–10.5)
CO2: 32 meq/L (ref 19–32)
CREATININE: 1.22 mg/dL (ref 0.40–1.50)
Chloride: 105 mEq/L (ref 96–112)
GFR: 56.67 mL/min — AB (ref >60.00)
GLUCOSE: 90 mg/dL (ref 70–99)
Potassium: 4.6 mEq/L (ref 3.5–5.1)
Sodium: 143 mEq/L (ref 135–145)
TOTAL PROTEIN: 6.6 g/dL (ref 6.0–8.3)

## 2018-11-18 LAB — CBC
HCT: 45.3 % (ref 39.0–52.0)
Hemoglobin: 15.4 g/dL (ref 13.0–17.0)
MCHC: 33.9 g/dL (ref 30.0–36.0)
MCV: 95.4 fl (ref 78.0–100.0)
PLATELETS: 175 10*3/uL (ref 150.0–400.0)
RBC: 4.75 Mil/uL (ref 4.22–5.81)
RDW: 13.4 % (ref 11.5–15.5)
WBC: 5.6 10*3/uL (ref 4.0–10.5)

## 2018-11-18 LAB — LIPID PANEL
CHOLESTEROL: 174 mg/dL (ref 0–200)
HDL: 48 mg/dL (ref >39.00)
LDL Cholesterol: 109 mg/dL — ABNORMAL HIGH (ref 0–99)
NONHDL: 125.62
Total CHOL/HDL Ratio: 4
Triglycerides: 83 mg/dL (ref 0.0–149.0)
VLDL: 16.6 mg/dL (ref 0.0–40.0)

## 2018-11-18 LAB — VITAMIN B12

## 2018-11-18 LAB — TSH: TSH: 1.22 u[IU]/mL (ref 0.35–4.50)

## 2018-11-18 NOTE — Addendum Note (Signed)
Addended by: Francis Dowse T on: 11/18/2018 08:54 AM   Modules accepted: Orders

## 2018-11-18 NOTE — Patient Instructions (Addendum)
Please check with your pharmacy to see if they have the shingrix vaccine. If they do- please get this immunization and update [place] by phone call or mychart with dates you receive the vaccine Patient will stop by pharmacy   Please stop by lab before you go If you do not have mychart- we will call you about results within 5 business days of [place] receiving them.  If you have mychart- we will send your results within 3 business days of [place] receiving them.  If abnormal or we want to clarify a result, we will call or mychart you to make sure you receive the message.  If you have questions or concerns or don't hear within 5-7 days, please send [place] a message or call us.     Team 1. Please make a copy of living will for patient and wife 2. Please make a copy of HCPOA for patient and wife 3. Please give these to [facility]

## 2018-11-18 NOTE — Progress Notes (Signed)
Phone: [phone]   Subjective:  Patient presents today for their annual physical. Chief complaint-noted.   See problem oriented charting- ROS- full  review of systems was completed and negative except for: dizzinesss, lightheaded if stands up quickly- better with sitting, trigger finger  BMI monitoring- elevated BMI noted: Body mass index is 25.86 kg/m. Encouraged need for healthy eating, regular exercise, weight loss.   BMI Metric Follow Up - 11/18/18 0828      BMI Metric Follow Up-Please document annually   BMI Metric Follow Up  Education provided       The following were reviewed and entered/updated in epic: Past Medical History:  Diagnosis Date  . Cancer ([place] and E)    skin  . Hernia 1987   right inguinal    Patient Active Problem List   Diagnosis Date Noted  . Hypothyroidism 08/02/2013    Priority: Medium  . Pernicious anemia 08/02/2013    Priority: Medium  . Hyperlipidemia 12/20/2007    Priority: Medium  . Senile purpura ([place]) 11/17/2017  . History of kidney stones 02/03/2016  . SKIN CANCER, HX OF 05/04/2007   Past Surgical History:  Procedure Laterality Date  . CATARACT EXTRACTION, BILATERAL    . HERNIA REPAIR    . MOHS SURGERY     left ear    Family History  Problem Relation Age of Onset  . Anemia Father   . Benign prostatic hyperplasia Brother   . Cancer Daughter   . Breast cancer Daughter     Medications- reviewed and updated Current Outpatient Medications  Medication Sig Dispense Refill  . cyanocobalamin (,VITAMIN B-12,) 1000 MCG/ML injection INJECT 1ML INTO MUSCLE EVERY 30 DAYS 10 mL 0  . levothyroxine (SYNTHROID, LEVOTHROID) 75 MCG tablet Take 1 tablet (75 mcg total) by mouth daily. 90 tablet 3  . Potassium Citrate 15 MEQ (1620 MG) TBCR Take 1 tablet by mouth every 12 (twelve) hours.    . simvastatin (ZOCOR) 20 MG tablet Take 1 tablet by mouth every other day 50 tablet 3   No current facility-administered medications for this visit.      Allergies-reviewed and updated No Known Allergies  Social History   Social History Narrative   Married. 2 children. 1 grandchild.       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 3x a week mows with self propelled.       Hobbies: a lot of time at church- building ramps etc, enjoys sports- football, basketball, baseball.    Objective  Objective:  BP 102/70 (BP Location: Right Arm, Patient Position: Sitting, Cuff Size: Large)   Pulse 67   Temp 97.7 F (36.5 C) (Oral)   Ht 5' 5.5" (1.664 m)   Wt 157 lb 12.8 oz (71.6 kg)   SpO2 96%   BMI 25.86 kg/m  Gen: NAD, resting comfortably HEENT: Mucous membranes are moist. Oropharynx normal Neck: no thyromegaly CV: RRR no murmurs rubs or gallops Lungs: CTAB no crackles, wheeze, rhonchi Abdomen: soft/nontender/nondistended/normal bowel sounds. No rebound or guarding. Healthy weight for age Ext: no edema Skin: warm, dry Neuro: grossly normal, moves all extremities, PERRLA MSK: right 3rd finger trigger finger noted- mild and no significant pain   Assessment and Plan  85 y.o. male presenting for annual physical.  Health Maintenance counseling: 1. Anticipatory guidance: Patient counseled regarding regular dental exams -q6 months, eye exams -yearly,  avoiding smoking  and second hand smoke , limiting alcohol to 2 beverages per day -doesn't drink.   2. Risk factor reduction:  Advised patient of need for regular exercise and diet rich and fruits and vegetables to reduce risk of heart attack and stroke. Exercise- staying active, mowing his own yard too . Diet-weight largely stable over last year.  Wt Readings from Last 3 Encounters:  11/18/18 157 lb 12.8 oz (71.6 kg)  07/08/18 152 lb 12.8 oz (69.3 kg)  11/17/17 156 lb 12.8 oz (71.1 kg)  3. Immunizations/screenings/ancillary studies-discussed Shingrix at pharmacy   Immunization History  Administered Date(s) Administered  . Influenza Split 07/04/2014  . Influenza Whole 10/12/2005, 07/26/2007, 07/12/2008, 07/15/2009, 07/29/2010, 07/12/2013  . Influenza, High Dose Seasonal PF 07/02/2018  . Influenza-Unspecified 07/13/2015, 07/24/2016, 07/13/2017, 07/02/2018  . Pneumococcal Conjugate-13 08/09/2014  . Pneumococcal Polysaccharide-23 10/12/2002, 07/15/2009  . Td 10/12/2002  . Tetanus 08/02/2013  . Zoster 08/19/2007  4. Prostate cancer screening-passed age based screening.  He follows with urology and they check urines. On potassium citrate to prevent kidney stones through urology 5. Colon cancer screening -last colonoscopy in 2008 with no repeat due to age.  No symptoms such as melena or bright red blood per rectum  6. Skin cancer screening-sees Dr. Ronnald Ramp every 6 months .  Advised regular sunscreen use. Denies worrisome, changing, or new skin lesions.  7.  Never smoker 8. HCPOA for each other. Living will states full code but no prolonged intubation or life support   Status of chronic or acute concerns   #Hyperlipidemia S: Patient is on simvastatin 20 mg every other day-this is for primary prevention and so even though LDL above goal- we have opted to continue current dose.  In 2019 we stopped aspirin due to bleeding with dental work and easy bruising. A/P: Stable. Continue current medications. Will tolerate slightly elevated LDL   # Hypothyroidism S:compliant with levothyroxine 75 mcg  A/P: hopefully Stable. Continue current medications.     #Pernicious anemia/B12 deficiency S: Patient self injects on a monthly basis-unfortunately he has to pay the full cost as his insurance does not cover. He is injecting every 4 weeks A/P: hopefully Stable. Continue current medications. Update b12   % Senile purpura-easy bruising and bleeding on forearms in particular-has been minimally better off aspirin-stopped 2019   % mild dizziness with standing at times-  better with sitting down. Does not stay well hydrated and may be knocking knees. Has happened a few times over last 3-4 months. May also be associated with reading glasses when hes standing up. He will let us know if no improvement  %right hand trigger finger- does not want further intervention  No problem-specific Assessment & Plan notes found for this encounter.   No future appointments. No follow-ups on file.  Lab/Order associations: fasting Preventative health care - Plan: TSH, Vitamin B12, CBC, Comprehensive metabolic panel, Lipid panel  Hyperlipidemia, unspecified hyperlipidemia type - Plan: CBC, Comprehensive metabolic panel, Lipid panel  Hypothyroidism, unspecified type - Plan: TSH  Pernicious anemia - Plan: Vitamin B12  Senile purpura ([place]), Chronic  BMI 25.0-25.9,adult  No orders of the defined types were placed in this encounter.   Return precautions advised.  Garret Reddish, MD

## 2018-11-21 NOTE — Addendum Note (Signed)
Addended by: Gwenyth Ober R on: 11/21/2018 04:46 PM   Modules accepted: Orders

## 2018-11-24 ENCOUNTER — Telehealth: Payer: Self-pay | Admitting: Family Medicine

## 2018-11-24 NOTE — Telephone Encounter (Signed)
Copied from [place] [id number]. Topic: General - Inquiry >> Nov 24, 2018  3:51 PM Rutherford Nail, NT wrote: Reason for CRM: Patient calling and would like to know how soon Dr Yong Channel wanted  him to follow up with his labs. No mention of how seen in results notes. Please advise.

## 2018-11-24 NOTE — Telephone Encounter (Signed)
Spoke to pt and had him follow up 3-6 months per Dr. Ronney Lion verbal orders. Pt scheduled for labs at 3months.

## 2018-12-14 ENCOUNTER — Other Ambulatory Visit: Payer: Self-pay | Admitting: Family Medicine

## 2019-01-30 ENCOUNTER — Other Ambulatory Visit: Payer: Self-pay | Admitting: Family Medicine

## 2019-02-22 ENCOUNTER — Other Ambulatory Visit: Payer: Self-pay | Admitting: Family Medicine

## 2019-02-22 DIAGNOSIS — E7800 Pure hypercholesterolemia, unspecified: Secondary | ICD-10-CM

## 2019-03-20 ENCOUNTER — Other Ambulatory Visit: Payer: Self-pay

## 2019-03-20 ENCOUNTER — Other Ambulatory Visit (INDEPENDENT_AMBULATORY_CARE_PROVIDER_SITE_OTHER): Payer: Medicare Other

## 2019-03-20 DIAGNOSIS — R944 Abnormal results of kidney function studies: Secondary | ICD-10-CM

## 2019-03-20 LAB — BASIC METABOLIC PANEL
BUN: 19 mg/dL (ref 6–23)
CO2: 31 mEq/L (ref 19–32)
Calcium: 8.8 mg/dL (ref 8.4–10.5)
Chloride: 105 mEq/L (ref 96–112)
Creatinine, Ser: 1.21 mg/dL (ref 0.40–1.50)
GFR: 57.16 mL/min — ABNORMAL LOW (ref >60.00)
Glucose, Bld: 114 mg/dL — ABNORMAL HIGH (ref 70–99)
Potassium: 3.9 mEq/L (ref 3.5–5.1)
Sodium: 142 mEq/L (ref 135–145)

## 2019-03-24 DIAGNOSIS — H0102A Squamous blepharitis right eye, upper and lower eyelids: Secondary | ICD-10-CM | POA: Diagnosis not present

## 2019-03-24 DIAGNOSIS — H04123 Dry eye syndrome of bilateral lacrimal glands: Secondary | ICD-10-CM | POA: Diagnosis not present

## 2019-03-24 DIAGNOSIS — H40013 Open angle with borderline findings, low risk, bilateral: Secondary | ICD-10-CM | POA: Diagnosis not present

## 2019-03-24 DIAGNOSIS — H0102B Squamous blepharitis left eye, upper and lower eyelids: Secondary | ICD-10-CM | POA: Diagnosis not present

## 2019-03-29 ENCOUNTER — Telehealth: Payer: Self-pay | Admitting: Family Medicine

## 2019-03-29 NOTE — Telephone Encounter (Signed)
Pt called and stated that he would like something called in for poison ivy. Pt states that he has had it for about 6 days. Please advise

## 2019-03-29 NOTE — Telephone Encounter (Signed)
Called pt and scheduled appointment for tomorrow morning.

## 2019-03-30 ENCOUNTER — Encounter: Payer: Self-pay | Admitting: Family Medicine

## 2019-03-30 ENCOUNTER — Other Ambulatory Visit: Payer: Self-pay

## 2019-03-30 ENCOUNTER — Ambulatory Visit (INDEPENDENT_AMBULATORY_CARE_PROVIDER_SITE_OTHER): Payer: Medicare Other | Admitting: Family Medicine

## 2019-03-30 VITALS — BP 116/72 | HR 63 | Temp 97.5°F | Ht 65.5 in | Wt 157.6 lb

## 2019-03-30 DIAGNOSIS — L255 Unspecified contact dermatitis due to plants, except food: Secondary | ICD-10-CM | POA: Diagnosis not present

## 2019-03-30 MED ORDER — TRIAMCINOLONE ACETONIDE 0.1 % EX CREA: 1.0000 "application " | TOPICAL_CREAM | Freq: Two times a day (BID) | CUTANEOUS | 0 refills | Status: DC

## 2019-03-30 MED ORDER — PREDNISONE 20 MG PO TABS: ORAL_TABLET | ORAL | 0 refills | Status: DC

## 2019-03-30 NOTE — Progress Notes (Signed)
Phone [phone]   Subjective:  Chad Fisher is a 85 y.o. year old very pleasant male patient who presents for/with See problem oriented charting Chief Complaint  Patient presents with  . Rash   Past Medical History-  Patient Active Problem List   Diagnosis Date Noted  . Hypothyroidism 08/02/2013    Priority: Medium  . Pernicious anemia 08/02/2013    Priority: Medium  . Hyperlipidemia 12/20/2007    Priority: Medium  . Senile purpura ([place]) 11/17/2017  . History of kidney stones 02/03/2016  . SKIN CANCER, HX OF 05/04/2007    Medications- reviewed and updated Current Outpatient Medications  Medication Sig Dispense Refill  . cyanocobalamin (,VITAMIN B-12,) 1000 MCG/ML injection INJECT 1ML INTO MUSCLE EVERY 30 DAYS 10 mL 0  . levothyroxine (SYNTHROID, LEVOTHROID) 75 MCG tablet TAKE 1 TABLET BY MOUTH  DAILY 90 tablet 3  . Potassium Citrate 15 MEQ (1620 MG) TBCR Take 1 tablet by mouth every 12 (twelve) hours.    . simvastatin (ZOCOR) 20 MG tablet TAKE 1 TABLET BY MOUTH  EVERY OTHER DAY 45 tablet 0  . predniSONE (DELTASONE) 20 MG tablet Take 2 pills for 2 days, 1 pill for 4 days, 1/2 pill for 3 days, 1/2 pill every other day until finished 11 tablet 0  . triamcinolone cream (KENALOG) 0.1 % Apply 1 application topically 2 (two) times daily. For 7-10 days maximum 80 g 0   No current facility-administered medications for this visit.      Objective:  BP 116/72 (BP Location: Left Arm, Patient Position: Sitting, Cuff Size: Normal)   Pulse 63   Temp (!) 97.5 F (36.4 C) (Oral)   Ht 5' 5.5" (1.664 m)   Wt 157 lb 9.6 oz (71.5 kg)   SpO2 97%   BMI 25.83 kg/m  Gen: NAD, resting comfortably CV: RRR no murmurs rubs or gallops Lungs: CTAB no crackles, wheeze, rhonchi Abdomen: soft/nontender/nondistended Ext: no edema Skin: warm, dry, linear rash patches in several areas- bilateral arms, bilateral hands- denies rash on other areas of body    Assessment and Plan   #Rash S:  Patient was out working in the yard last Tuesday.  He discovered he had gotten into some poison ivy.  Within a day or so noted rash on bilateral arms.  Mild pruritus.  The worst part of the rash is on his right arm and hand but also has some on the left arm.  He has been using Aveeno calamine lotion with some relief.  He has had multiple rounds of poison ivy in the past and feels he responds well to oral prednisone-he would like to try another round of this.  Patient admits current rashes not as extensive as some of his prior rashes from poison ivy ROS-not ill appearing, no fever/chills. No new medications. Not immunocompromised. No mucus membrane involvement.  A/P: Contact dermatitis due to poison ivy.  Discussed possibly trying triamcinolone cream to start since he feels this is a more mild case but he strongly prefers to try prednisone- this was sent in and side effects were discussed.  We still sent in triamcinolone and he may try at earlier phases of future poison ivy contact.  Future Appointments  Date Time Provider [place]  11/20/2019  8:20 AM Marin Olp, MD [facility]   Lab/Order associations:   ICD-10-CM   1. Contact dermatitis due to plant  L25.5     Meds ordered this encounter  Medications  . predniSONE (DELTASONE) 20 MG  tablet    Sig: Take 2 pills for 2 days, 1 pill for 4 days, 1/2 pill for 3 days, 1/2 pill every other day until finished    Dispense:  11 tablet    Refill:  0  . triamcinolone cream (KENALOG) 0.1 %    Sig: Apply 1 application topically 2 (two) times daily. For 7-10 days maximum    Dispense:  80 g    Refill:  0    Return precautions advised.  Garret Reddish, MD

## 2019-03-30 NOTE — Patient Instructions (Addendum)
Poison ivy- try prednisone. You will have triamcinolone on hand in case you get into poison ivy in future and can try that instead of aveeno or calamine  If no better or new or worsening symptoms- please let us know

## 2019-04-24 DIAGNOSIS — L821 Other seborrheic keratosis: Secondary | ICD-10-CM | POA: Diagnosis not present

## 2019-04-24 DIAGNOSIS — L570 Actinic keratosis: Secondary | ICD-10-CM | POA: Diagnosis not present

## 2019-04-24 DIAGNOSIS — D044 Carcinoma in situ of skin of scalp and neck: Secondary | ICD-10-CM | POA: Diagnosis not present

## 2019-04-24 DIAGNOSIS — Z85828 Personal history of other malignant neoplasm of skin: Secondary | ICD-10-CM | POA: Diagnosis not present

## 2019-04-25 ENCOUNTER — Other Ambulatory Visit: Payer: Self-pay | Admitting: Family Medicine

## 2019-04-25 DIAGNOSIS — E7800 Pure hypercholesterolemia, unspecified: Secondary | ICD-10-CM

## 2019-05-04 DIAGNOSIS — N200 Calculus of kidney: Secondary | ICD-10-CM | POA: Diagnosis not present

## 2019-06-22 ENCOUNTER — Other Ambulatory Visit: Payer: Self-pay | Admitting: Family Medicine

## 2019-06-22 DIAGNOSIS — E7800 Pure hypercholesterolemia, unspecified: Secondary | ICD-10-CM

## 2019-07-11 ENCOUNTER — Telehealth: Payer: Self-pay | Admitting: Family Medicine

## 2019-07-11 NOTE — Telephone Encounter (Signed)
I called the patient to schedule AWV w/ Loma Sousa.  Pt's wife said that [facility] came to their home about a month ago for the wellness visit, and she brought in the paper to give to Dr. Yong Channel. VDM (DD)

## 2019-09-21 DIAGNOSIS — H40013 Open angle with borderline findings, low risk, bilateral: Secondary | ICD-10-CM | POA: Diagnosis not present

## 2019-09-21 DIAGNOSIS — H35363 Drusen (degenerative) of macula, bilateral: Secondary | ICD-10-CM | POA: Diagnosis not present

## 2019-09-21 DIAGNOSIS — H43813 Vitreous degeneration, bilateral: Secondary | ICD-10-CM | POA: Diagnosis not present

## 2019-09-21 DIAGNOSIS — H353132 Nonexudative age-related macular degeneration, bilateral, intermediate dry stage: Secondary | ICD-10-CM | POA: Diagnosis not present

## 2019-10-26 DIAGNOSIS — L570 Actinic keratosis: Secondary | ICD-10-CM | POA: Diagnosis not present

## 2019-10-26 DIAGNOSIS — Z85828 Personal history of other malignant neoplasm of skin: Secondary | ICD-10-CM | POA: Diagnosis not present

## 2019-10-26 DIAGNOSIS — D225 Melanocytic nevi of trunk: Secondary | ICD-10-CM | POA: Diagnosis not present

## 2019-10-26 DIAGNOSIS — L821 Other seborrheic keratosis: Secondary | ICD-10-CM | POA: Diagnosis not present

## 2019-11-17 ENCOUNTER — Other Ambulatory Visit: Payer: Self-pay

## 2019-11-20 ENCOUNTER — Other Ambulatory Visit: Payer: Self-pay

## 2019-11-20 ENCOUNTER — Ambulatory Visit (INDEPENDENT_AMBULATORY_CARE_PROVIDER_SITE_OTHER): Payer: Medicare Other | Admitting: Family Medicine

## 2019-11-20 ENCOUNTER — Encounter: Payer: Self-pay | Admitting: Family Medicine

## 2019-11-20 VITALS — BP 122/60 | HR 74 | Temp 97.6°F | Ht 65.5 in | Wt 156.4 lb

## 2019-11-20 DIAGNOSIS — E785 Hyperlipidemia, unspecified: Secondary | ICD-10-CM

## 2019-11-20 DIAGNOSIS — E039 Hypothyroidism, unspecified: Secondary | ICD-10-CM | POA: Diagnosis not present

## 2019-11-20 DIAGNOSIS — D510 Vitamin B12 deficiency anemia due to intrinsic factor deficiency: Secondary | ICD-10-CM | POA: Diagnosis not present

## 2019-11-20 DIAGNOSIS — Z0000 Encounter for general adult medical examination without abnormal findings: Secondary | ICD-10-CM | POA: Diagnosis not present

## 2019-11-20 DIAGNOSIS — Z85828 Personal history of other malignant neoplasm of skin: Secondary | ICD-10-CM

## 2019-11-20 DIAGNOSIS — N183 Chronic kidney disease, stage 3 unspecified: Secondary | ICD-10-CM | POA: Insufficient documentation

## 2019-11-20 DIAGNOSIS — Z87442 Personal history of urinary calculi: Secondary | ICD-10-CM

## 2019-11-20 DIAGNOSIS — D692 Other nonthrombocytopenic purpura: Secondary | ICD-10-CM

## 2019-11-20 LAB — LIPID PANEL
Cholesterol: 158 mg/dL (ref 0–200)
HDL: 43.5 mg/dL (ref >39.00)
LDL Cholesterol: 98 mg/dL (ref 0–99)
NonHDL: 114.61
Total CHOL/HDL Ratio: 4
Triglycerides: 84 mg/dL (ref 0.0–149.0)
VLDL: 16.8 mg/dL (ref 0.0–40.0)

## 2019-11-20 LAB — COMPREHENSIVE METABOLIC PANEL
ALT: 13 U/L (ref 0–53)
AST: 17 U/L (ref 0–37)
Albumin: 4 g/dL (ref 3.5–5.2)
Alkaline Phosphatase: 85 U/L (ref 39–117)
BUN: 20 mg/dL (ref 6–23)
CO2: 31 mEq/L (ref 19–32)
Calcium: 9.1 mg/dL (ref 8.4–10.5)
Chloride: 106 mEq/L (ref 96–112)
Creatinine, Ser: 1.22 mg/dL (ref 0.40–1.50)
GFR: 56.53 mL/min — ABNORMAL LOW (ref >60.00)
Glucose, Bld: 100 mg/dL — ABNORMAL HIGH (ref 70–99)
Potassium: 5 mEq/L (ref 3.5–5.1)
Sodium: 143 mEq/L (ref 135–145)
Total Bilirubin: 0.9 mg/dL (ref 0.2–1.2)
Total Protein: 6.7 g/dL (ref 6.0–8.3)

## 2019-11-20 LAB — CBC WITH DIFFERENTIAL/PLATELET
Basophils Absolute: 0.1 10*3/uL (ref 0.0–0.1)
Basophils Relative: 0.9 % (ref 0.0–3.0)
Eosinophils Absolute: 0.6 10*3/uL (ref 0.0–0.7)
Eosinophils Relative: 10.3 % — ABNORMAL HIGH (ref 0.0–5.0)
HCT: 44.6 % (ref 39.0–52.0)
Hemoglobin: 15.1 g/dL (ref 13.0–17.0)
Lymphocytes Relative: 23.5 % (ref 12.0–46.0)
Lymphs Abs: 1.4 10*3/uL (ref 0.7–4.0)
MCHC: 33.8 g/dL (ref 30.0–36.0)
MCV: 95.5 fl (ref 78.0–100.0)
Monocytes Absolute: 0.6 10*3/uL (ref 0.1–1.0)
Monocytes Relative: 10.1 % (ref 3.0–12.0)
Neutro Abs: 3.3 10*3/uL (ref 1.4–7.7)
Neutrophils Relative %: 55.2 % (ref 43.0–77.0)
Platelets: 158 10*3/uL (ref 150.0–400.0)
RBC: 4.67 Mil/uL (ref 4.22–5.81)
RDW: 13.2 % (ref 11.5–15.5)
WBC: 6.1 10*3/uL (ref 4.0–10.5)

## 2019-11-20 LAB — VITAMIN B12: Vitamin B-12: 592 pg/mL (ref 211–911)

## 2019-11-20 LAB — TSH: TSH: 1.25 u[IU]/mL (ref 0.35–4.50)

## 2019-11-20 NOTE — Progress Notes (Signed)
Phone: [phone]   Subjective:  Patient presents today for their annual physical. Chief complaint-noted.   See problem oriented charting- Review of Systems  Constitutional: Negative for chills and fever.  HENT: Negative for ear pain, hearing loss and tinnitus.   Eyes: Negative for blurred vision, double vision and photophobia.  Respiratory: Negative for cough, shortness of breath and wheezing.   Cardiovascular: Negative for chest pain, palpitations and leg swelling.  Gastrointestinal: Negative for blood in stool, heartburn, nausea and vomiting.  Genitourinary: Negative for dysuria, frequency and urgency.  Musculoskeletal: Negative for back pain, joint pain and neck pain.  Skin: Negative for rash.  Neurological: Negative for dizziness, seizures, weakness and headaches.  Endo/Heme/Allergies: Does not bruise/bleed easily.  Psychiatric/Behavioral: Negative for depression, hallucinations, memory loss and suicidal ideas. The patient does not have insomnia.    full  review of systems was completed and negative  except for: mild TMJ, some finger pain   The following were reviewed and entered/updated in epic: Past Medical History:  Diagnosis Date  . Cancer ([place])    skin  . Hernia 1987   right inguinal    Patient Active Problem List   Diagnosis Date Noted  . Hypothyroidism 08/02/2013    Priority: Medium  . Pernicious anemia 08/02/2013    Priority: Medium  . Hyperlipidemia 12/20/2007    Priority: Medium  . Senile purpura ([place]) 11/17/2017  . History of kidney stones 02/03/2016  . SKIN CANCER, HX OF 05/04/2007   Past Surgical History:  Procedure Laterality Date  . CATARACT EXTRACTION, BILATERAL    . HERNIA REPAIR    . MOHS SURGERY     left ear    Family History  Problem Relation Age of Onset  . Anemia Father   . Benign prostatic hyperplasia Brother   . Cancer Daughter   . Breast cancer Daughter   . Kidney cancer Son        doing ok    Medications- reviewed and  updated Current Outpatient Medications  Medication Sig Dispense Refill  . cyanocobalamin (,VITAMIN B-12,) 1000 MCG/ML injection INJECT 1ML INTO MUSCLE EVERY 30 DAYS 10 mL 0  . levothyroxine (SYNTHROID, LEVOTHROID) 75 MCG tablet TAKE 1 TABLET BY MOUTH  DAILY 90 tablet 3  . Multiple Vitamins-Minerals (ICAPS AREDS 2 PO) Take by mouth.    . Potassium Citrate 15 MEQ (1620 MG) TBCR Take 1 tablet by mouth every 12 (twelve) hours.    . simvastatin (ZOCOR) 20 MG tablet TAKE 1 TABLET BY MOUTH  EVERY OTHER DAY 45 tablet 1   No current facility-administered medications for this visit.    Allergies-reviewed and updated No Known Allergies  Social History   Social History Narrative   Married. 1 living son (daughter died of cancer). 1 grandchild. 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 3x a week mows with self propelled.       Hobbies: a lot of time at church- building ramps etc, enjoys sports- football, basketball, baseball.    Objective  Objective:  BP 122/60   Pulse 74   Temp 97.6 F (36.4 C) (Temporal)   Ht 5' 5.5" (1.664 m)   Wt 156 lb 6.4 oz (70.9 kg)   SpO2 96%   BMI 25.63 kg/m  Gen: NAD, resting comfortably HEENT: Mask not removed due to covid 19.  TM normal. Bridge of nose normal. Eyelids normal.  Neck: no thyromegaly or cervical lymphadenopathy  CV: RRR no murmurs rubs or gallops Lungs: CTAB no crackles, wheeze, rhonchi Abdomen: soft/nontender/nondistended/normal bowel sounds. No rebound or guarding.  Ext: no edema Skin: warm, dry Neuro: grossly normal, moves all extremities, PERRLA    Assessment and Plan  86 y.o. male presenting for annual physical.  Health Maintenance counseling: 1. Anticipatory guidance: Patient counseled regarding regular dental exams q6 months, eye exams yearly,  avoiding smoking and  second hand smoke , limiting alcohol to 2 beverages per day . Patient does not drink.    2. Risk factor reduction:  Advised patient of need for regular exercise and diet rich and fruits and vegetables to reduce risk of heart attack and stroke. Exercise- Tries to stay active around home working in yard- intentional about yardwork. Diet- Tries to eat healthy diet. Working on portion control. .  Wt Readings from Last 3 Encounters:  11/20/19 156 lb 6.4 oz (70.9 kg)  03/30/19 157 lb 9.6 oz (71.5 kg)  11/18/18 157 lb 12.8 oz (71.6 kg)  3. Immunizations/screenings/ancillary studies-discussed COVID-19 vaccination and has 2nd moderna vaccine tomorrow. Wife also getting hers tomorrow . He is having some 2nd thoughts on if he had pfizer or moderna- he will let us know.  Immunization History  Administered Date(s) Administered  . Fluad Quad(high Dose 65+) 06/22/2019  . Influenza Split 07/04/2014  . Influenza Whole 10/12/2005, 07/26/2007, 07/12/2008, 07/15/2009, 07/29/2010, 07/12/2013  . Influenza, High Dose Seasonal PF 07/02/2018  . Influenza-Unspecified 07/13/2015, 07/24/2016, 07/13/2017, 07/02/2018  . Moderna SARS-COVID-2 Vaccination 10/31/2019  . Pneumococcal Conjugate-13 08/09/2014  . Pneumococcal Polysaccharide-23 10/12/2002, 07/15/2009  . Td 10/12/2002  . Tetanus 08/02/2013  . Zoster 08/19/2007  4. Prostate cancer screening-past age to based screening recommendations.  He follows with urology for history of kidney stones.  He is on potassium citrate to help prevent kidney stones.  They are also deferring PSAs due to age  Lab Results  Component Value Date   PSA 0.82 08/09/2014   PSA 0.90 08/02/2013   PSA 0.65 11/03/2011   5. Colon cancer screening -last colonoscopy 2008 with no repeat due to age.  No blood in the stool reported.  6. Skin cancer screening-has a history of skin cancer and sees dermatology Dr. Ronnald Ramp every 6 months.Was just seen about a month ago. Did not have to have any areas cut  only frozen off.  7.  Never smoker 8.  Healthcare power of attorney is his wife.  His living will states full code but no prolonged intubation or life support.  Status of chronic or acute concerns   #Hyperlipidemia S: Patient is on simvastatin 20 mg every other day-this is for primary prevention and so even though LDL above goal- we have opted to continue current dose.  In 2019 we stopped aspirin due to bleeding with dental work and easy bruising. Lab Results  Component Value Date   CHOL 174 11/18/2018   HDL 48.00 11/18/2018   LDLCALC 109 (H) 11/18/2018   LDLDIRECT 187.8 12/02/2006   TRIG 83.0 11/18/2018   CHOLHDL 4 11/18/2018   A/P: Suspect mild poor control but as using simvastatin for primary prevention alone unlikely to increase dose  # Hypothyroidism S: Compliant with levothyroxine 75 mcg. Takes every morning on empty stomach. Denies any new issues  A/P: Update TSH today.  Hopefully controlled   #Pernicious anemia/B12 deficiency S: Patient self injects on a monthly basis-unfortunately he has to pay the  full cost as his insurance does not cover A/P: Suspect well-controlled-update B12 level today  % Senile purpura-easy bruising and bleeding on forearms in particular-has been better off aspirin-stopped 2019  Recommended follow up: Patient strongly prefers 1 year physicals-we will try to do annual wellness visits at the same time  Lab/Order associations: fasting   ICD-10-CM   1. Preventative health care  Z00.00 Vitamin B12    TSH    CBC with Differential/Platelet    Comprehensive metabolic panel    Lipid panel  2. Hyperlipidemia, unspecified hyperlipidemia type  E78.5 CBC with Differential/Platelet    Comprehensive metabolic panel    Lipid panel  3. Pernicious anemia  D51.0 Vitamin B12  4. Hypothyroidism, unspecified type  E03.9 TSH  5. SKIN CANCER, HX OF  Z85.828   6. Senile purpura ([facility])  D69.2   7. History of kidney stones  Z87.442     No orders of the defined types  were placed in this encounter.   Return precautions advised.  Garret Reddish, MD

## 2019-11-20 NOTE — Addendum Note (Signed)
Addended by: Francis Dowse T on: 11/20/2019 08:53 AM   Modules accepted: Orders

## 2019-11-20 NOTE — Progress Notes (Signed)
Phone: [phone]    Subjective:  Patient presents today for their annual wellness visit (subsequent)  Preventive Screening-Counseling & Management  Modifiable Risk Factors/behavioral risk assessment/psychosocial risk assessment Regular exercise: very active in his yard Diet:  Reasonably healthy  Wt Readings from Last 3 Encounters:  11/20/19 156 lb 6.4 oz (70.9 kg)  03/30/19 157 lb 9.6 oz (71.5 kg)  11/18/18 157 lb 12.8 oz (71.6 kg)  Smoking Status: Never Smoker Second Hand Smoking status: No smokers in home Alcohol intake: 0 per week  Cardiac risk factors:  advanced age (older than 78 for men, 60 for women)  Treated hyperlipidemia-though mild poor control No hypertension  No diabetes.  Screening with fasting CBG Family History: None  Depression Screen/risk evaluation Risk factors: No history of depression.Marland Kitchen PHQ2 0  Depression screen [facility] 2/9 11/20/2019 11/18/2018 07/08/2018 11/17/2016 11/12/2015  Decreased Interest 0 0 0 0 0  Down, Depressed, Hopeless 0 0 0 0 0  PHQ - 2 Score 0 0 0 0 0  Altered sleeping 0 - - - -  Tired, decreased energy 0 - - - -  Change in appetite 0 - - - -  Feeling bad or failure about yourself  0 - - - -  Trouble concentrating 0 - - - -  Moving slowly or fidgety/restless 0 - - - -  Suicidal thoughts 0 - - - -  PHQ-9 Score 0 - - - -  Difficult doing work/chores Not difficult at all - - - -   Functional ability and level of safety Mobility assessment:  timed get up and go <12 seconds Activities of Daily Living- Independent in ADLs (toileting, bathing, dressing, transferring, eating) and in IADLs (shopping, housekeeping, managing own medications, and handling finances) Home Safety: Loose rugs (no), smoke detectors (yes), small pets (no), grab bars (yes), stairs (stairs but he lives downstairs), life-alert system (uses his phone) Hearing Difficulties: -patient declines Fall Risk: None  Fall Risk  11/20/2019 11/18/2018 07/08/2018 11/17/2016 11/12/2015  Falls in  the past year? 0 0 No No No  Number falls in past yr: 0 0 - - -  Injury with Fall? 0 0 - - -  Opioid use history:  no long term opioids use Self assessment of health status: "really good"   Cognitive Testing             No reported trouble.   Mini cog: normal clock draw. 3/3 delayed recall. Normal test result   leader, season, table    List the Names of Other Physician/Practitioners you currently use: Patient Care Team: Marin Olp, MD as PCP - General (Family Medicine) Franchot Gallo, MD as Consulting Physician (Urology) Danella Sensing, MD as Consulting Physician (Dermatology) Monna Fam, MD as Consulting Physician (Ophthalmology) Dentist as well  Required Immunizations needed today: Discussed COVID-19 vaccination and has 2nd vaccine planned tomorrow- actually had pfizer Immunization History  Administered Date(s) Administered  . Fluad Quad(high Dose 65+) 06/22/2019  . Influenza Split 07/04/2014  . Influenza Whole 10/12/2005, 07/26/2007, 07/12/2008, 07/15/2009, 07/29/2010, 07/12/2013  . Influenza, High Dose Seasonal PF 07/02/2018  . Influenza-Unspecified 07/13/2015, 07/24/2016, 07/13/2017, 07/02/2018  . Moderna SARS-COVID-2 Vaccination 10/31/2019  . Pneumococcal Conjugate-13 08/09/2014  . Pneumococcal Polysaccharide-23 10/12/2002, 07/15/2009  . Td 10/12/2002  . Tetanus 08/02/2013  . Zoster 08/19/2007   Health Maintenance  Topic Date Due  . Tetanus Vaccine  08/03/2023  . Flu Shot  Completed  . Pneumonia vaccines  Completed    Screening tests-  1. Colon cancer screening-last done  within 50 years-past age based screening recommendations 2. Lung Cancer screening-not a candidate has never smoker 3. Skin cancer screening-history of skin cancer-follows with Dr. Ronnald Ramp every 6 months dermatology 4. Prostate cancer screening-past age based screening recommendations Lab Results  Component Value Date   PSA 0.82 08/09/2014   PSA 0.90 08/02/2013   PSA 0.65 11/03/2011    The following were reviewed and entered/updated in epic if appropriate: Past Medical History:  Diagnosis Date  . Cancer ([place])    skin  . Hernia 1987   right inguinal    Patient Active Problem List   Diagnosis Date Noted  . Hypothyroidism 08/02/2013    Priority: Medium  . Pernicious anemia 08/02/2013    Priority: Medium  . Hyperlipidemia 12/20/2007    Priority: Medium  . Senile purpura ([place]) 11/17/2017  . History of kidney stones 02/03/2016  . SKIN CANCER, HX OF 05/04/2007   Past Surgical History:  Procedure Laterality Date  . CATARACT EXTRACTION, BILATERAL    . HERNIA REPAIR    . MOHS SURGERY     left ear    Family History  Problem Relation Age of Onset  . Anemia Father   . Benign prostatic hyperplasia Brother   . Cancer Daughter   . Breast cancer Daughter   . Kidney cancer Son        doing ok    Medications- reviewed and updated Current Outpatient Medications  Medication Sig Dispense Refill  . cyanocobalamin (,VITAMIN B-12,) 1000 MCG/ML injection INJECT 1ML INTO MUSCLE EVERY 30 DAYS 10 mL 0  . levothyroxine (SYNTHROID, LEVOTHROID) 75 MCG tablet TAKE 1 TABLET BY MOUTH  DAILY 90 tablet 3  . Multiple Vitamins-Minerals (ICAPS AREDS 2 PO) Take by mouth.    . Potassium Citrate 15 MEQ (1620 MG) TBCR Take 1 tablet by mouth every 12 (twelve) hours.    . simvastatin (ZOCOR) 20 MG tablet TAKE 1 TABLET BY MOUTH  EVERY OTHER DAY 45 tablet 1   No current facility-administered medications for this visit.    Allergies-reviewed and updated No Known Allergies  Social History   Socioeconomic History  . Marital status: Married    Spouse name: Not on file  . Number of children: Not on file  . Years of education: Not on file  . Highest education level: Not on file  Occupational History  . Not on file  Tobacco Use  . Smoking status: Never Smoker  . Smokeless tobacco: Never Used  Substance and Sexual Activity  . Alcohol use: No    Alcohol/week: 0.0 standard drinks  .  Drug use: No  . Sexual activity: Not on file  Other Topics Concern  . Not on file  Social History Narrative   Married. 1 living son (daughter died of cancer). 1 grandchild. 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 3x a week mows with self propelled.       Hobbies: a lot of time at church- building ramps etc, enjoys sports- football, basketball, baseball.    Social Determinants of Health   Financial Resource Strain:   . Difficulty of Paying Living Expenses: Not on file  Food Insecurity:   . Worried About Charity fundraiser in the Last Year: Not on file  . Ran Out of Food in the Last Year: Not on file  Transportation Needs:   . Film/video editor (Medical): Not on file  . Lack of Transportation (Non-Medical): Not on file  Physical Activity:   . Days of Exercise per Week: Not on file  . Minutes of Exercise per Session: Not on file  Stress:   . Feeling of Stress : Not on file  Social Connections:   . Frequency of Communication with Friends and Family: Not on file  . Frequency of Social Gatherings with Friends and Family: Not on file  . Attends Religious Services: Not on file  . Active Member of Clubs or Organizations: Not on file  . Attends Archivist Meetings: Not on file  . Marital Status: Not on file     Objective:  BP 122/60   Pulse 74   Temp 97.6 F (36.4 C) (Temporal)   Ht 5' 5.5" (1.664 m)   Wt 156 lb 6.4 oz (70.9 kg)   SpO2 96%   BMI 25.63 kg/m  Gen: NAD, resting comfortably    Assessment/Plan:  AWV completed 1. Educated, counseled and referred based on above elements 2. Educated, counseled and referred as appropriate for preventative needs 3. Discussed and documented a written plan for preventiative services and screenings with personalized health advice- After Visit Summary was  given to patient which included this plan   Status of chronic or acute concerns  See physical note  Recommended follow up: Annual wellness visit in 1 year at same time as physical   Lab/Order associations:    ICD-10-CM    1. Preventative health care   Z00.00     Return precautions advised. Garret Reddish, MD

## 2019-11-20 NOTE — Patient Instructions (Addendum)
Please stop by lab before you go If you do not have mychart- we will call you about results within 5 business days of [place] receiving them.  If you have mychart- we will send your results within 3 business days of [place] receiving them.  If abnormal or we want to clarify a result, we will call or mychart you to make sure you receive the message.  If you have questions or concerns or don't hear within 5-7 days, please send [place] a message or call us.     Mr. Randles , Thank you for taking time to come for your Medicare Wellness Visit. I appreciate your ongoing commitment to your health goals. Please review the following plan we discussed and let me know if I can assist you in the future.   These are the goals we discussed: Goals    . Patient Stated     Patient wishes to stay as active as he is now       This is a list of the screening recommended for you and due dates:  Health Maintenance  Topic Date Due  . Tetanus Vaccine  08/03/2023  . Flu Shot  Completed  . Pneumonia vaccines  Completed    Recommended follow up: Return in about 1 year (around 11/19/2020) for physical or sooner if needed.  We will try to do wellness visit at the same time.

## 2019-12-09 ENCOUNTER — Other Ambulatory Visit: Payer: Self-pay | Admitting: Family Medicine

## 2019-12-09 DIAGNOSIS — E7800 Pure hypercholesterolemia, unspecified: Secondary | ICD-10-CM

## 2019-12-14 ENCOUNTER — Telehealth: Payer: Self-pay

## 2019-12-14 NOTE — Telephone Encounter (Signed)
Immunization has been added to patient's chart

## 2019-12-14 NOTE — Telephone Encounter (Signed)
Patient received flu vaccine September 2020 at [facility].

## 2019-12-14 NOTE — Telephone Encounter (Signed)
Patient received the flu vaccine September 2020 at [facility]

## 2020-02-27 ENCOUNTER — Other Ambulatory Visit: Payer: Self-pay | Admitting: Family Medicine

## 2020-03-21 DIAGNOSIS — H40013 Open angle with borderline findings, low risk, bilateral: Secondary | ICD-10-CM | POA: Diagnosis not present

## 2020-05-02 DIAGNOSIS — L570 Actinic keratosis: Secondary | ICD-10-CM | POA: Diagnosis not present

## 2020-05-02 DIAGNOSIS — L720 Epidermal cyst: Secondary | ICD-10-CM | POA: Diagnosis not present

## 2020-05-02 DIAGNOSIS — Z85828 Personal history of other malignant neoplasm of skin: Secondary | ICD-10-CM | POA: Diagnosis not present

## 2020-05-02 DIAGNOSIS — L821 Other seborrheic keratosis: Secondary | ICD-10-CM | POA: Diagnosis not present

## 2020-05-02 DIAGNOSIS — D485 Neoplasm of uncertain behavior of skin: Secondary | ICD-10-CM | POA: Diagnosis not present

## 2020-05-13 DIAGNOSIS — N200 Calculus of kidney: Secondary | ICD-10-CM | POA: Diagnosis not present

## 2020-09-26 DIAGNOSIS — H353132 Nonexudative age-related macular degeneration, bilateral, intermediate dry stage: Secondary | ICD-10-CM | POA: Diagnosis not present

## 2020-09-26 DIAGNOSIS — H35363 Drusen (degenerative) of macula, bilateral: Secondary | ICD-10-CM | POA: Diagnosis not present

## 2020-09-26 DIAGNOSIS — H04123 Dry eye syndrome of bilateral lacrimal glands: Secondary | ICD-10-CM | POA: Diagnosis not present

## 2020-09-26 DIAGNOSIS — H40013 Open angle with borderline findings, low risk, bilateral: Secondary | ICD-10-CM | POA: Diagnosis not present

## 2020-11-04 DIAGNOSIS — L570 Actinic keratosis: Secondary | ICD-10-CM | POA: Diagnosis not present

## 2020-11-04 DIAGNOSIS — C4442 Squamous cell carcinoma of skin of scalp and neck: Secondary | ICD-10-CM | POA: Diagnosis not present

## 2020-11-04 DIAGNOSIS — L821 Other seborrheic keratosis: Secondary | ICD-10-CM | POA: Diagnosis not present

## 2020-11-04 DIAGNOSIS — L723 Sebaceous cyst: Secondary | ICD-10-CM | POA: Diagnosis not present

## 2020-11-04 DIAGNOSIS — Z85828 Personal history of other malignant neoplasm of skin: Secondary | ICD-10-CM | POA: Diagnosis not present

## 2020-11-04 DIAGNOSIS — D692 Other nonthrombocytopenic purpura: Secondary | ICD-10-CM | POA: Diagnosis not present

## 2020-11-04 DIAGNOSIS — D485 Neoplasm of uncertain behavior of skin: Secondary | ICD-10-CM | POA: Diagnosis not present

## 2020-11-21 ENCOUNTER — Encounter: Payer: Medicare Other | Admitting: Family Medicine

## 2020-11-26 NOTE — Progress Notes (Signed)
Phone: [phone]   Subjective:  Patient presents today for their annual physical. Chief complaint-noted.   See problem oriented charting- ROS- full  review of systems was completed and negative  except for: muscle aches- particularly below left shoulder- ibuprofen helps- he thinks may be arthritis. 2-3  A week. Never gets exertional pain to cause this. Not sure if its worse on nights after he is more active. Advised to try tylenol  The following were reviewed and entered/updated in epic: Past Medical History:  Diagnosis Date  . Cancer ([place])    skin  . Hernia 1987   right inguinal    Patient Active Problem List   Diagnosis Date Noted  . CKD (chronic kidney disease), stage III ([place]) 11/20/2019    Priority: Medium  . Hypothyroidism 08/02/2013    Priority: Medium  . Pernicious anemia 08/02/2013    Priority: Medium  . Hyperlipidemia 12/20/2007    Priority: Medium  . Senile purpura ([place]) 11/17/2017  . History of kidney stones 02/03/2016  . SKIN CANCER, HX OF 05/04/2007   Past Surgical History:  Procedure Laterality Date  . CATARACT EXTRACTION, BILATERAL    . HERNIA REPAIR    . MOHS SURGERY     left ear    Family History  Problem Relation Age of Onset  . Anemia Father   . Benign prostatic hyperplasia Brother   . Cancer Daughter   . Breast cancer Daughter   . Kidney cancer Son        doing ok    Medications- reviewed and updated Current Outpatient Medications  Medication Sig Dispense Refill  . levothyroxine (SYNTHROID) 75 MCG tablet TAKE 1 TABLET BY MOUTH  DAILY 90 tablet 3  . Multiple Vitamins-Minerals (ICAPS AREDS 2 PO) Take by mouth.    . Potassium Citrate 15 MEQ (1620 MG) TBCR Take 1 tablet by mouth every 12 (twelve) hours.    . simvastatin (ZOCOR) 20 MG tablet TAKE 1 TABLET BY MOUTH  EVERY OTHER DAY 45 tablet 3  . cyanocobalamin (,VITAMIN B-12,) 1000 MCG/ML injection INJECT 97mL INTRAMUSCULARLY EVERY 30 DAYS 10 mL 3   No current facility-administered  medications for this visit.   Allergies-reviewed and updated No Known Allergies  Social History   Social History Narrative   Married. 1 living son (daughter died of cancer). 1 grandchild. 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 3x a week mows with self propelled.       Hobbies: a lot of time at church- building ramps etc, enjoys sports- football, basketball, baseball.    Objective  Objective:  BP 116/72   Pulse 65   Temp 97.7 F (36.5 C) (Temporal)   Ht 5\' 8"  (1.727 m)   Wt 152 lb 9.6 oz (69.2 kg)   SpO2 98%   BMI 23.20 kg/m  Gen: NAD, resting comfortably HEENT: Mucous membranes are moist. Oropharynx normal. Very small hole in left TM Neck: no thyromegaly CV: RRR no murmurs rubs or gallops Lungs: CTAB no crackles, wheeze, rhonchi Abdomen: soft/nontender/nondistended/normal bowel sounds. No rebound or guarding.  Ext: no edema Skin: warm, dry Neuro: grossly normal, moves all extremities, PERRLA   Assessment and Plan  87 y.o. male presenting for annual physical.  Health Maintenance counseling: 1. Anticipatory guidance: Patient counseled regarding regular dental exams -q6 months, eye exams -yearly,  avoiding smoking and second hand smoke , limiting alcohol to 2 beverages per day -doesn't drink and no illicit drugs.   2. Risk factor reduction:  Advised patient of need for regular exercise and diet rich and fruits and vegetables to reduce risk of heart attack and stroke. Exercise- active around the house- cares for his own yard- prefers this over options like walking. Diet- reasonably healthy.  Wt Readings from Last 3 Encounters:  11/27/20 152 lb 9.6 oz (69.2 kg)  11/20/19 156 lb 6.4 oz (70.9 kg)  03/30/19 157 lb 9.6 oz (71.5 kg)  3. Immunizations/screenings/ancillary studies- recently had shingrix- team will  log Immunization History  Administered Date(s) Administered  . Fluad Quad(high Dose 65+) 06/22/2019, 06/12/2020  . Influenza Split 07/04/2014  . Influenza Whole 10/12/2005, 07/26/2007, 07/12/2008, 07/15/2009, 07/29/2010, 07/12/2013  . Influenza, High Dose Seasonal PF 07/02/2018, 06/13/2019  . Influenza-Unspecified 07/13/2015, 07/24/2016, 07/13/2017, 07/02/2018  . PFIZER(Purple Top)SARS-COV-2 Vaccination 10/31/2019, 11/21/2019, 07/09/2020  . Pneumococcal Conjugate-13 08/09/2014  . Pneumococcal Polysaccharide-23 10/12/2002, 07/15/2009  . Td 10/12/2002  . Tetanus 08/02/2013  . Zoster 08/19/2007  . Zoster Recombinat (Shingrix) 05/09/2020, 07/26/2020  4. Prostate cancer screening-  follows with urology Dr. Diona Fanti for history of kidney stones. Also on potassium citrate. Past age based screening recs for prostate cancer Lab Results  Component Value Date   PSA 0.82 08/09/2014   PSA 0.90 08/02/2013   PSA 0.65 11/03/2011   5. Colon cancer screening -  no blood in stool. Colonoscopy 2008 and was told no repeat due to age per his report.  6. Skin cancer screening- follows with Dr. Ronnald Ramp every 6 months due to history of skin cancer. Had recent removal.  7. Never smoker 8. STD screening -  only active with wife- not needed  Status of chronic or acute concerns   #Hyperlipidemia S: Patient is on simvastatin 20 mg every other day-this is for primary prevention and so even though LDL above goal- we have opted to continue current dose.  In 2019 we stopped aspirin due to bleeding with dental work and easy bruising. Lab Results  Component Value Date   CHOL 158 11/20/2019   HDL 43.50 11/20/2019   LDLCALC 98 11/20/2019   LDLDIRECT 187.8 12/02/2006   TRIG 84.0 11/20/2019   CHOLHDL 4 11/20/2019  A/P: hopefully controlled- update lipids today- try to gkeep LDL at least under 100 though if close may focus on diet/exercise first   #hypothyroidism S: compliant On thyroid medication-  75 mcg takes  every morning on empty stomach Lab Results  Component Value Date   TSH 1.25 11/20/2019  A/P:hopefully stable- update tsh today. Continue current meds    #Pernicious anemia/B12 deficiency S: Patient self injects on a monthly basis-unfortunately he has to pay the full cost as his insurance does not cover Lab Results  Component Value Date   [id number] 093 11/20/2019  A/P: hopefully stable- update B!2 today. Continue current meds   #Chronic kidney disease stage III S: GFR is typically in the 50s range -Patient knows to avoid NSAIDs after discussion today- had been using some ibuprofen  A/P: hopefully stable- update CMP today.    % Senile purpura-easy bruising and bleeding on forearms in particular-has been better off aspirin-stopped 2019- still continuing issues  # if gets water in his ear feels like it stays in there for a while- worse on left ear- uses - TM normal- will monitor. Ear plugs before shower does help some.  -on exam very small rupture of TM- no  clear trigger. Offered ENT referral but he prefers not to go at this time and simply use ear plugs with showers as has been helpful for him. He will call me if this changes  For left shoulder/upper arm pain at night. Not exertional. Only happening af ew times a week- advised tylenol instead of ibuprofen. If worsens he agrees to contact us- could consider x-ray or SM refer  Recommended follow up: Return in about 1 year (around 11/27/2021) for physical or sooner if needed.  Lab/Order associations: fasting   ICD-10-CM   1. Preventative health care  Z00.00 CBC with Differential/Platelet    Comprehensive metabolic panel    Lipid panel    TSH    Vitamin B12  2. Hypothyroidism, unspecified type  E03.9 TSH  3. Hyperlipidemia, unspecified hyperlipidemia type  E78.5 CBC with Differential/Platelet    Comprehensive metabolic panel    Lipid panel  4. Senile purpura ([facility]) Chronic D69.2   5. Pernicious anemia  D51.0 Vitamin B12  6. Stage 3  chronic kidney disease, unspecified whether stage 3a or 3b CKD ([place])  N18.30     Meds ordered this encounter  Medications  . cyanocobalamin (,VITAMIN B-12,) 1000 MCG/ML injection    Sig: INJECT 16mL INTRAMUSCULARLY EVERY 30 DAYS    Dispense:  10 mL    Refill:  3    Return precautions advised.  Garret Reddish, MD

## 2020-11-26 NOTE — Patient Instructions (Addendum)
Please stop by lab before you go If you have mychart- we will send your results within 3 business days of [place] receiving them.  If you do not have mychart- we will call you about results within 5 business days of [place] receiving them.  *please also note that you will see labs on mychart as soon as they post. I will later go in and write notes on them- will say "notes from Dr. Yong Channel"  No changes today unless labs lead [place] to make changes   You are eligible to schedule your annual wellness visit with our nurse specialist Otila Kluver.  Please consider scheduling this before you leave today

## 2020-11-27 ENCOUNTER — Ambulatory Visit (INDEPENDENT_AMBULATORY_CARE_PROVIDER_SITE_OTHER): Payer: Medicare Other | Admitting: Family Medicine

## 2020-11-27 ENCOUNTER — Encounter: Payer: Self-pay | Admitting: Family Medicine

## 2020-11-27 ENCOUNTER — Other Ambulatory Visit: Payer: Self-pay

## 2020-11-27 VITALS — BP 116/72 | HR 65 | Temp 97.7°F | Ht 68.0 in | Wt 152.6 lb

## 2020-11-27 DIAGNOSIS — N183 Chronic kidney disease, stage 3 unspecified: Secondary | ICD-10-CM | POA: Diagnosis not present

## 2020-11-27 DIAGNOSIS — Z0000 Encounter for general adult medical examination without abnormal findings: Secondary | ICD-10-CM | POA: Diagnosis not present

## 2020-11-27 DIAGNOSIS — D692 Other nonthrombocytopenic purpura: Secondary | ICD-10-CM | POA: Diagnosis not present

## 2020-11-27 DIAGNOSIS — D510 Vitamin B12 deficiency anemia due to intrinsic factor deficiency: Secondary | ICD-10-CM

## 2020-11-27 DIAGNOSIS — E039 Hypothyroidism, unspecified: Secondary | ICD-10-CM

## 2020-11-27 DIAGNOSIS — E785 Hyperlipidemia, unspecified: Secondary | ICD-10-CM

## 2020-11-27 LAB — CBC WITH DIFFERENTIAL/PLATELET
Basophils Absolute: 0.1 10*3/uL (ref 0.0–0.1)
Basophils Relative: 0.9 % (ref 0.0–3.0)
Eosinophils Absolute: 0.4 10*3/uL (ref 0.0–0.7)
Eosinophils Relative: 5.6 % — ABNORMAL HIGH (ref 0.0–5.0)
HCT: 42.7 % (ref 39.0–52.0)
Hemoglobin: 14.3 g/dL (ref 13.0–17.0)
Lymphocytes Relative: 20.1 % (ref 12.0–46.0)
Lymphs Abs: 1.4 10*3/uL (ref 0.7–4.0)
MCHC: 33.6 g/dL (ref 30.0–36.0)
MCV: 94.2 fl (ref 78.0–100.0)
Monocytes Absolute: 0.6 10*3/uL (ref 0.1–1.0)
Monocytes Relative: 9.4 % (ref 3.0–12.0)
Neutro Abs: 4.3 10*3/uL (ref 1.4–7.7)
Neutrophils Relative %: 64 % (ref 43.0–77.0)
Platelets: 172 10*3/uL (ref 150.0–400.0)
RBC: 4.53 Mil/uL (ref 4.22–5.81)
RDW: 13.4 % (ref 11.5–15.5)
WBC: 6.7 10*3/uL (ref 4.0–10.5)

## 2020-11-27 LAB — LIPID PANEL
Cholesterol: 176 mg/dL (ref 0–200)
HDL: 48.2 mg/dL (ref >39.00)
LDL Cholesterol: 110 mg/dL — ABNORMAL HIGH (ref 0–99)
NonHDL: 128.11
Total CHOL/HDL Ratio: 4
Triglycerides: 89 mg/dL (ref 0.0–149.0)
VLDL: 17.8 mg/dL (ref 0.0–40.0)

## 2020-11-27 LAB — COMPREHENSIVE METABOLIC PANEL
ALT: 16 U/L (ref 0–53)
AST: 18 U/L (ref 0–37)
Albumin: 4.1 g/dL (ref 3.5–5.2)
Alkaline Phosphatase: 79 U/L (ref 39–117)
BUN: 21 mg/dL (ref 6–23)
CO2: 30 mEq/L (ref 19–32)
Calcium: 9.5 mg/dL (ref 8.4–10.5)
Chloride: 105 mEq/L (ref 96–112)
Creatinine, Ser: 1.09 mg/dL (ref 0.40–1.50)
GFR: 61.91 mL/min (ref >60.00)
Glucose, Bld: 92 mg/dL (ref 70–99)
Potassium: 5 mEq/L (ref 3.5–5.1)
Sodium: 142 mEq/L (ref 135–145)
Total Bilirubin: 0.7 mg/dL (ref 0.2–1.2)
Total Protein: 7.4 g/dL (ref 6.0–8.3)

## 2020-11-27 LAB — VITAMIN B12: Vitamin B-12: 388 pg/mL (ref 211–911)

## 2020-11-27 LAB — TSH: TSH: 1.79 u[IU]/mL (ref 0.35–4.50)

## 2020-11-27 MED ORDER — CYANOCOBALAMIN 1000 MCG/ML IJ SOLN: INTRAMUSCULAR | 3 refills | Status: DC

## 2020-11-29 ENCOUNTER — Other Ambulatory Visit: Payer: Self-pay

## 2020-11-29 DIAGNOSIS — E7800 Pure hypercholesterolemia, unspecified: Secondary | ICD-10-CM

## 2020-11-29 MED ORDER — SIMVASTATIN 20 MG PO TABS: 20.0000 mg | ORAL_TABLET | Freq: Every day | ORAL | 3 refills | Status: DC

## 2020-12-14 ENCOUNTER — Other Ambulatory Visit: Payer: Self-pay | Admitting: Family Medicine

## 2020-12-14 DIAGNOSIS — E7800 Pure hypercholesterolemia, unspecified: Secondary | ICD-10-CM

## 2021-01-30 ENCOUNTER — Ambulatory Visit (INDEPENDENT_AMBULATORY_CARE_PROVIDER_SITE_OTHER): Payer: Medicare Other

## 2021-01-30 DIAGNOSIS — Z0000 Encounter for general adult medical examination without abnormal findings: Secondary | ICD-10-CM

## 2021-01-30 NOTE — Progress Notes (Signed)
Virtual Visit via Telephone Note  I connected with  Chad Fisher on 01/30/21 at  8:45 AM EDT by telephone and verified that I am speaking with the correct person using two identifiers.  Medicare Annual Wellness visit completed telephonically due to Covid-19 pandemic.   Persons participating in this call: This Health Coach and this patient.   Location: Patient: Home Provider: Office    I discussed the limitations, risks, security and privacy concerns of performing an evaluation and management service by telephone and the availability of in person appointments. The patient expressed understanding and agreed to proceed.  Unable to perform video visit due to video visit attempted and failed and/or patient does not have video capability.   Some vital signs may be absent or patient reported.   Willette Brace, LPN    Subjective:   Chad Fisher is a 87 y.o. male who presents for Medicare Annual/Subsequent preventive examination.  Review of Systems     Cardiac Risk Factors include: advanced age (>37men, >18 women);dyslipidemia;male gender     Objective:    There were no vitals filed for this visit. There is no height or weight on file to calculate BMI.  Advanced Directives 01/30/2021 07/08/2018  Does Patient Have a Medical Advance Directive? Yes Yes  Type of Paramedic of [place];Living will  Does patient want to make changes to medical advance directive? - No - Patient declined  Copy of [place] in Chart? Yes - validated most recent copy scanned in chart (See row information) No - copy requested    Current Medications (verified) Outpatient Encounter Medications as of 01/30/2021  Medication Sig  . cyanocobalamin (,VITAMIN B-12,) 1000 MCG/ML injection INJECT 70mL INTRAMUSCULARLY EVERY 30 DAYS  . levothyroxine (SYNTHROID) 75 MCG tablet TAKE 1 TABLET BY MOUTH  DAILY  . Multiple Vitamins-Minerals (ICAPS AREDS  2 PO) Take by mouth.  . Potassium Citrate 15 MEQ (1620 MG) TBCR Take 1 tablet by mouth every 12 (twelve) hours.  . simvastatin (ZOCOR) 20 MG tablet TAKE 1 TABLET BY MOUTH  EVERY OTHER DAY   No facility-administered encounter medications on file as of 01/30/2021.    Allergies (verified) Patient has no known allergies.   History: Past Medical History:  Diagnosis Date  . Cancer ([place])    skin  . Hernia 1987   right inguinal    Past Surgical History:  Procedure Laterality Date  . CATARACT EXTRACTION, BILATERAL    . HERNIA REPAIR    . MOHS SURGERY     left ear   Family History  Problem Relation Age of Onset  . Anemia Father   . Benign prostatic hyperplasia Brother   . Cancer Daughter   . Breast cancer Daughter   . Kidney cancer Son        doing ok   Social History   Socioeconomic History  . Marital status: Married    Spouse name: Not on file  . Number of children: Not on file  . Years of education: Not on file  . Highest education level: Not on file  Occupational History  . Not on file  Tobacco Use  . Smoking status: Never Smoker  . Smokeless tobacco: Never Used  Vaping Use  . Vaping Use: Never used  Substance and Sexual Activity  . Alcohol use: No    Alcohol/week: 0.0 standard drinks  . Drug use: No  . Sexual activity: Not on file  Other Topics Concern  .  Not on file  Social History Narrative   Married. 1 living son (daughter died of cancer). 1 grandchild. 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 3x a week mows with self propelled.       Hobbies: a lot of time at church- building ramps etc, enjoys sports- football, basketball, baseball.    Social Determinants of Health   Financial Resource Strain: Low Risk   . Difficulty of Paying Living Expenses: Not hard at all  Food Insecurity: No Food  Insecurity  . Worried About Charity fundraiser in the Last Year: Never true  . Ran Out of Food in the Last Year: Never true  Transportation Needs: No Transportation Needs  . Lack of Transportation (Medical): No  . Lack of Transportation (Non-Medical): No  Physical Activity: Inactive  . Days of Exercise per Week: 0 days  . Minutes of Exercise per Session: 0 min  Stress: No Stress Concern Present  . Feeling of Stress : Not at all  Social Connections: Moderately Integrated  . Frequency of Communication with Friends and Family: Twice a week  . Frequency of Social Gatherings with Friends and Family: More than three times a week  . Attends Religious Services: More than 4 times per year  . Active Member of Clubs or Organizations: No  . Attends Archivist Meetings: Never  . Marital Status: Married    Tobacco Counseling Counseling given: Not Answered   Clinical Intake:  Pre-visit preparation completed: Yes  Pain : No/denies pain     BMI - recorded: 23.21 Nutritional Status: BMI of 19-24  Normal Nutritional Risks: None Diabetes: No  How often do you need to have someone help you when you read instructions, pamphlets, or other written materials from your doctor or pharmacy?: 1 - Never  Diabetic?No  Interpreter Needed?: No  Information entered by :: Charlott Rakes, LPN   Activities of Daily Living In your present state of health, do you have any difficulty performing the following activities: 01/30/2021  Hearing? N  Vision? N  Difficulty concentrating or making decisions? N  Walking or climbing stairs? N  Dressing or bathing? N  Doing errands, shopping? N  Preparing Food and eating ? N  Using the Toilet? N  In the past six months, have you accidently leaked urine? N  Do you have problems with loss of bowel control? N  Managing your Medications? N  Managing your Finances? N  Housekeeping or managing your Housekeeping? N  Some recent data might be hidden     Patient Care Team: Marin Olp, MD as PCP - General (Family Medicine) Franchot Gallo, MD as Consulting Physician (Urology) Danella Sensing, MD as Consulting Physician (Dermatology) Monna Fam, MD as Consulting Physician (Ophthalmology)  Indicate any recent Medical Services you may have received from other than [facility] providers in the past year (date may be approximate).     Assessment:   This is a routine wellness examination for [place].  Hearing/Vision screen  Hearing Screening   125Hz  250Hz  500Hz  1000Hz  2000Hz  3000Hz  4000Hz  6000Hz  8000Hz   Right ear:           Left ear:           Comments: Pt denies any hearing issues   Vision Screening Comments: Dr Herbert Deaner follow for annual eye exams  Dietary issues and exercise activities discussed: Current Exercise Habits: The patient does not participate in regular exercise at present  Goals    . Patient Stated     Patient wishes to stay as active as he is now    . Patient Stated     None at this time      Depression Screen PHQ 2/9 Scores 01/30/2021 11/27/2020 11/20/2019 11/18/2018 07/08/2018 11/17/2016 11/12/2015  PHQ - 2 Score 0 0 0 0 0 0 0  PHQ- 9 Score - - 0 - - - -    Fall Risk Fall Risk  01/30/2021 11/27/2020 11/20/2019 11/18/2018 07/08/2018  Falls in the past year? 0 0 0 0 No  Number falls in past yr: 0 0 0 0 -  Injury with Fall? 0 0 0 0 -  Risk for fall due to : Impaired vision - - - -  Follow up Falls prevention discussed - - - -    FALL RISK PREVENTION PERTAINING TO THE HOME:  Any stairs in or around the home? Yes  If so, are there any without handrails? No  Home free of loose throw rugs in walkways, pet beds, electrical cords, etc? Yes  Adequate lighting in your home to reduce risk of falls? Yes   ASSISTIVE DEVICES UTILIZED TO PREVENT FALLS:  Life alert? No  Use of a cane, walker or w/c? No  Grab bars in the bathroom? Yes  Shower chair or bench in shower? Yes  Elevated toilet seat or a handicapped toilet? Yes    TIMED UP AND GO:  Was the test performed? No .       Cognitive Function:     6CIT Screen 01/30/2021 07/08/2018  What Year? 0 points 0 points  What month? 0 points 0 points  What time? - 0 points  Count back from 20 0 points 0 points  Months in reverse 0 points 0 points  Repeat phrase 0 points 0 points  Total Score - 0    Immunizations Immunization History  Administered Date(s) Administered  . Fluad Quad(high Dose 65+) 06/22/2019, 06/12/2020  . Influenza Split 07/04/2014  . Influenza Whole 10/12/2005, 07/26/2007, 07/12/2008, 07/15/2009, 07/29/2010, 07/12/2013  . Influenza, High Dose Seasonal PF 07/02/2018, 06/13/2019  . Influenza-Unspecified 07/13/2015, 07/24/2016, 07/13/2017, 07/02/2018  . PFIZER(Purple Top)SARS-COV-2 Vaccination 10/31/2019, 11/21/2019, 07/09/2020  . Pneumococcal Conjugate-13 08/09/2014  . Pneumococcal Polysaccharide-23 10/12/2002, 07/15/2009  . Td 10/12/2002  . Tetanus 08/02/2013  . Zoster 08/19/2007  . Zoster Recombinat (Shingrix) 05/09/2020, 07/26/2020    TDAP status: Up to date  Flu Vaccine status: Up to date  Pneumococcal vaccine status: Up to date  Covid-19 vaccine status: Completed vaccines  Qualifies for Shingles Vaccine? Yes   Zostavax completed Yes   Shingrix Completed?: Yes  Screening Tests Health Maintenance  Topic Date Due  . INFLUENZA VACCINE  05/12/2021  . TETANUS/TDAP  08/03/2023  . COVID-19 Vaccine  Completed  . PNA vac Low Risk Adult  Completed  . HPV VACCINES  Aged Out    Health Maintenance  There are no preventive care reminders to display for this patient.  Colorectal cancer screening: No longer required.     Additional Screening:    Vision Screening: Recommended annual ophthalmology exams for early detection of glaucoma and other disorders of the eye. Is the patient up to date with their annual eye exam?  Yes  Who is the provider or what is the name of the office in which the patient attends annual eye  exams? Dr  Herbert Deaner  If pt is not established with a provider, would they like to be referred to a provider to establish care? No .   Dental Screening: Recommended annual dental exams for proper oral hygiene  Community Resource Referral / Chronic Care Management: CRR required this visit?  No   CCM required this visit?  No      Plan:     I have personally reviewed and noted the following in the patient's chart:   . Medical and social history . Use of alcohol, tobacco or illicit drugs  . Current medications and supplements . Functional ability and status . Nutritional status . Physical activity . Advanced directives . List of other physicians . Hospitalizations, surgeries, and ER visits in previous 12 months . Vitals . Screenings to include cognitive, depression, and falls . Referrals and appointments  In addition, I have reviewed and discussed with patient certain preventive protocols, quality metrics, and best practice recommendations. A written personalized care plan for preventive services as well as general preventive health recommendations were provided to patient.     Willette Brace, LPN   [id number]   Nurse Notes: None

## 2021-01-30 NOTE — Patient Instructions (Addendum)
[name] , Thank you for taking time to come for your Medicare Wellness Visit. I appreciate your ongoing commitment to your health goals. Please review the following plan we discussed and let me know if I can assist you in the future.   Screening recommendations/referrals: Colonoscopy: No longer required  Recommended yearly ophthalmology/optometry visit for glaucoma screening and checkup Recommended yearly dental visit for hygiene and checkup  Vaccinations: Influenza vaccine: Up to date Pneumococcal vaccine: Up to date Tdap vaccine: Up to date Shingles vaccine: Completed 05/09/20 & 07/26/20   Covid-19: Completed 1/19, 2/9, 07/09/20  Advanced directives: Copies in chart  Conditions/risks identified: None at this time  Next appointment: Follow up in one year for your annual wellness visit.   Preventive Care 85 Years and Older, Male Preventive care refers to lifestyle choices and visits with your health care provider that can promote health and wellness. What does preventive care include?  A yearly physical exam. This is also called an annual well check.  Dental exams once or twice a year.  Routine eye exams. Ask your health care provider how often you should have your eyes checked.  Personal lifestyle choices, including:  Daily care of your teeth and gums.  Regular physical activity.  Eating a healthy diet.  Avoiding tobacco and drug use.  Limiting alcohol use.  Practicing safe sex.  Taking low doses of aspirin every day.  Taking vitamin and mineral supplements as recommended by your health care provider. What happens during an annual well check? The services and screenings done by your health care provider during your annual well check will depend on your age, overall health, lifestyle risk factors, and family history of disease. Counseling  Your health care provider may ask you questions about your:  Alcohol use.  Tobacco use.  Drug use.  Emotional  well-being.  Home and relationship well-being.  Sexual activity.  Eating habits.  History of falls.  Memory and ability to understand (cognition).  Work and work Statistician. Screening  You may have the following tests or measurements:  Height, weight, and BMI.  Blood pressure.  Lipid and cholesterol levels. These may be checked every 5 years, or more frequently if you are over 85 years old.  Skin check.  Lung cancer screening. You may have this screening every year starting at age 85 if you have a 30-pack-year history of smoking and currently smoke or have quit within the past 15 years.  Fecal occult blood test (FOBT) of the stool. You may have this test every year starting at age 85.  Flexible sigmoidoscopy or colonoscopy. You may have a sigmoidoscopy every 5 years or a colonoscopy every 10 years starting at age 85.  Prostate cancer screening. Recommendations will vary depending on your family history and other risks.  Hepatitis C blood test.  Hepatitis B blood test.  Sexually transmitted disease (STD) testing.  Diabetes screening. This is done by checking your blood sugar (glucose) after you have not eaten for a while (fasting). You may have this done every 1-3 years.  Abdominal aortic aneurysm (AAA) screening. You may need this if you are a current or former smoker.  Osteoporosis. You may be screened starting at age 24 if you are at high risk. Talk with your health care provider about your test results, treatment options, and if necessary, the need for more tests. Vaccines  Your health care provider may recommend certain vaccines, such as:  Influenza vaccine. This is recommended every year.  Tetanus, diphtheria, and  acellular pertussis (Tdap, Td) vaccine. You may need a Td booster every 10 years.  Zoster vaccine. You may need this after age 85.  Pneumococcal 13-valent conjugate (PCV13) vaccine. One dose is recommended after age 85.  Pneumococcal  polysaccharide (PPSV23) vaccine. One dose is recommended after age 85. Talk to your health care provider about which screenings and vaccines you need and how often you need them. This information is not intended to replace advice given to you by your health care provider. Make sure you discuss any questions you have with your health care provider. Document Released: 10/25/2015 Document Revised: 06/17/2016 Document Reviewed: 07/30/2015 Elsevier Interactive Patient Education  2017 [place] Prevention in the Home Falls can cause injuries. They can happen to people of all ages. There are many things you can do to make your home safe and to help prevent falls. What can I do on the outside of my home?  Regularly fix the edges of walkways and driveways and fix any cracks.  Remove anything that might make you trip as you walk through a door, such as a raised step or threshold.  Trim any bushes or trees on the path to your home.  Use bright outdoor lighting.  Clear any walking paths of anything that might make someone trip, such as rocks or tools.  Regularly check to see if handrails are loose or broken. Make sure that both sides of any steps have handrails.  Any raised decks and porches should have guardrails on the edges.  Have any leaves, snow, or ice cleared regularly.  Use sand or salt on walking paths during winter.  Clean up any spills in your garage right away. This includes oil or grease spills. What can I do in the bathroom?  Use night lights.  Install grab bars by the toilet and in the tub and shower. Do not use towel bars as grab bars.  Use non-skid mats or decals in the tub or shower.  If you need to sit down in the shower, use a plastic, non-slip stool.  Keep the floor dry. Clean up any water that spills on the floor as soon as it happens.  Remove soap buildup in the tub or shower regularly.  Attach bath mats securely with double-sided non-slip rug  tape.  Do not have throw rugs and other things on the floor that can make you trip. What can I do in the bedroom?  Use night lights.  Make sure that you have a light by your bed that is easy to reach.  Do not use any sheets or blankets that are too big for your bed. They should not hang down onto the floor.  Have a firm chair that has side arms. You can use this for support while you get dressed.  Do not have throw rugs and other things on the floor that can make you trip. What can I do in the kitchen?  Clean up any spills right away.  Avoid walking on wet floors.  Keep items that you use a lot in easy-to-reach places.  If you need to reach something above you, use a strong step stool that has a grab bar.  Keep electrical cords out of the way.  Do not use floor polish or wax that makes floors slippery. If you must use wax, use non-skid floor wax.  Do not have throw rugs and other things on the floor that can make you trip. What can I do with my stairs?  Do not leave any items on the stairs.  Make sure that there are handrails on both sides of the stairs and use them. Fix handrails that are broken or loose. Make sure that handrails are as long as the stairways.  Check any carpeting to make sure that it is firmly attached to the stairs. Fix any carpet that is loose or worn.  Avoid having throw rugs at the top or bottom of the stairs. If you do have throw rugs, attach them to the floor with carpet tape.  Make sure that you have a light switch at the top of the stairs and the bottom of the stairs. If you do not have them, ask someone to add them for you. What else can I do to help prevent falls?  Wear shoes that:  Do not have high heels.  Have rubber bottoms.  Are comfortable and fit you well.  Are closed at the toe. Do not wear sandals.  If you use a stepladder:  Make sure that it is fully opened. Do not climb a closed stepladder.  Make sure that both sides of the  stepladder are locked into place.  Ask someone to hold it for you, if possible.  Clearly mark and make sure that you can see:  Any grab bars or handrails.  First and last steps.  Where the edge of each step is.  Use tools that help you move around (mobility aids) if they are needed. These include:  Canes.  Walkers.  Scooters.  Crutches.  Turn on the lights when you go into a dark area. Replace any light bulbs as soon as they burn out.  Set up your furniture so you have a clear path. Avoid moving your furniture around.  If any of your floors are uneven, fix them.  If there are any pets around you, be aware of where they are.  Review your medicines with your doctor. Some medicines can make you feel dizzy. This can increase your chance of falling. Ask your doctor what other things that you can do to help prevent falls. This information is not intended to replace advice given to you by your health care provider. Make sure you discuss any questions you have with your health care provider. Document Released: 07/25/2009 Document Revised: 03/05/2016 Document Reviewed: 11/02/2014 Elsevier Interactive Patient Education  2017 [employer].

## 2021-02-11 ENCOUNTER — Encounter: Payer: Self-pay | Admitting: Family

## 2021-02-11 ENCOUNTER — Ambulatory Visit (INDEPENDENT_AMBULATORY_CARE_PROVIDER_SITE_OTHER): Payer: Medicare Other

## 2021-02-11 ENCOUNTER — Other Ambulatory Visit: Payer: Self-pay

## 2021-02-11 ENCOUNTER — Ambulatory Visit (INDEPENDENT_AMBULATORY_CARE_PROVIDER_SITE_OTHER): Payer: Medicare Other | Admitting: Family

## 2021-02-11 VITALS — BP 122/72 | HR 68 | Temp 98.4°F | Ht 68.0 in | Wt 154.2 lb

## 2021-02-11 DIAGNOSIS — M79622 Pain in left upper arm: Secondary | ICD-10-CM | POA: Diagnosis not present

## 2021-02-11 DIAGNOSIS — M47812 Spondylosis without myelopathy or radiculopathy, cervical region: Secondary | ICD-10-CM | POA: Diagnosis not present

## 2021-02-11 DIAGNOSIS — M810 Age-related osteoporosis without current pathological fracture: Secondary | ICD-10-CM | POA: Diagnosis not present

## 2021-02-11 DIAGNOSIS — H938X2 Other specified disorders of left ear: Secondary | ICD-10-CM

## 2021-02-11 DIAGNOSIS — Z8669 Personal history of other diseases of the nervous system and sense organs: Secondary | ICD-10-CM | POA: Diagnosis not present

## 2021-02-11 DIAGNOSIS — M2578 Osteophyte, vertebrae: Secondary | ICD-10-CM | POA: Diagnosis not present

## 2021-02-11 DIAGNOSIS — M79602 Pain in left arm: Secondary | ICD-10-CM | POA: Diagnosis not present

## 2021-02-11 DIAGNOSIS — I6529 Occlusion and stenosis of unspecified carotid artery: Secondary | ICD-10-CM | POA: Diagnosis not present

## 2021-02-11 DIAGNOSIS — J929 Pleural plaque without asbestos: Secondary | ICD-10-CM | POA: Diagnosis not present

## 2021-02-11 NOTE — Progress Notes (Signed)
Acute Office Visit  Subjective:    Patient ID: Chad Fisher, male    DOB: [birth year], 87 y.o.   MRN: [id number]  Chief Complaint  Patient presents with  . Arm Pain    Starting 2 months ago. He takes Tylenol to help with pain, but has not subsided.    HPI 87 year old male presents today with complaints of left arm pain that has been going on for 2 months.  Reports the pain is aching and throbbing but is typically worse at night.  However, it does not occur every night.  Normally fine during the day.  He takes Tylenol that helps with the pain.  Pain is typically a 6-7 out of 10.  Denies any injury, no heavy lifting, no past medical history of any injuries to the arm, shoulder, or neck.  Patient would also like for his left ear to be checked.  Reports that his physical in February he was experiencing fullness.  That fullness has subsided.  No pain or other concerns at this point with his ear.  Past Medical History:  Diagnosis Date  . Cancer ([place])    skin  . Hernia 1987   right inguinal     Past Surgical History:  Procedure Laterality Date  . CATARACT EXTRACTION, BILATERAL    . HERNIA REPAIR    . MOHS SURGERY     left ear    Family History  Problem Relation Age of Onset  . Anemia Father   . Benign prostatic hyperplasia Brother   . Cancer Daughter   . Breast cancer Daughter   . Kidney cancer Son        doing ok    Social History   Socioeconomic History  . Marital status: Married    Spouse name: Not on file  . Number of children: Not on file  . Years of education: Not on file  . Highest education level: Not on file  Occupational History  . Not on file  Tobacco Use  . Smoking status: Never Smoker  . Smokeless tobacco: Never Used  Vaping Use  . Vaping Use: Never used  Substance and Sexual Activity  . Alcohol use: No    Alcohol/week: 0.0 standard drinks  . Drug use: No  . Sexual activity: Not on file  Other Topics Concern  . Not on file  Social History  Narrative   Married. 1 living son (daughter died of cancer). 1 grandchild. 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 3x a week mows with self propelled.       Hobbies: a lot of time at church- building ramps etc, enjoys sports- football, basketball, baseball.    Social Determinants of Health   Financial Resource Strain: Low Risk   . Difficulty of Paying Living Expenses: Not hard at all  Food Insecurity: No Food Insecurity  . Worried About Charity fundraiser in the Last Year: Never true  . Ran Out of Food in the Last Year: Never true  Transportation Needs: No Transportation Needs  . Lack of Transportation (Medical): No  . Lack of Transportation (Non-Medical): No  Physical Activity: Inactive  . Days of Exercise per Week: 0 days  . Minutes of Exercise per Session: 0 min  Stress: No Stress Concern Present  . Feeling of Stress :  Not at all  Social Connections: Moderately Integrated  . Frequency of Communication with Friends and Family: Twice a week  . Frequency of Social Gatherings with Friends and Family: More than three times a week  . Attends Religious Services: More than 4 times per year  . Active Member of Clubs or Organizations: No  . Attends Archivist Meetings: Never  . Marital Status: Married  Human resources officer Violence: Not At Risk  . Fear of Current or Ex-Partner: No  . Emotionally Abused: No  . Physically Abused: No  . Sexually Abused: No    Outpatient Medications Prior to Visit  Medication Sig Dispense Refill  . cyanocobalamin (,VITAMIN B-12,) 1000 MCG/ML injection INJECT 79mL INTRAMUSCULARLY EVERY 30 DAYS 10 mL 3  . levothyroxine (SYNTHROID) 75 MCG tablet TAKE 1 TABLET BY MOUTH  DAILY 90 tablet 3  . Multiple Vitamins-Minerals (ICAPS AREDS 2 PO) Take by mouth.    . Potassium Citrate 15 MEQ  (1620 MG) TBCR Take 1 tablet by mouth every 12 (twelve) hours.    . simvastatin (ZOCOR) 20 MG tablet TAKE 1 TABLET BY MOUTH  EVERY OTHER DAY 45 tablet 3   No facility-administered medications prior to visit.    No Known Allergies  Review of Systems  Constitutional: Negative.   HENT: Negative.        Left ear fullness in the past-resolved  Respiratory: Negative.   Cardiovascular: Negative.   Endocrine: Negative.   Musculoskeletal: Negative for back pain, joint swelling, neck pain and neck stiffness.       Left arm pain  Skin: Negative.   Allergic/Immunologic: Negative.   Neurological: Negative.  Negative for dizziness and numbness.  Psychiatric/Behavioral: Negative.        Objective:    Physical Exam Constitutional:      Appearance: Normal appearance. He is normal weight.  HENT:     Right Ear: Tympanic membrane and ear canal normal.     Left Ear: Tympanic membrane and ear canal normal.  Cardiovascular:     Rate and Rhythm: Normal rate and regular rhythm.     Pulses: Normal pulses.     Heart sounds: Normal heart sounds.  Pulmonary:     Effort: Pulmonary effort is normal.     Breath sounds: Normal breath sounds.  Abdominal:     General: Abdomen is flat.     Palpations: Abdomen is soft.  Musculoskeletal:        General: No swelling, tenderness, deformity or signs of injury. Normal range of motion.     Cervical back: Normal range of motion and neck supple. No rigidity or tenderness.     Comments: Full range of motion of the left arm without pain.  No swelling, no deformity.  Negative empty can test.  Skin:    General: Skin is warm and dry.  Neurological:     General: No focal deficit present.     Mental Status: He is alert and oriented to person, place, and time. Mental status is at baseline.  Psychiatric:        Mood and Affect: Mood normal.        Behavior: Behavior normal.     BP 122/72   Pulse 68   Temp 98.4 F (36.9 C) (Temporal)   Ht 5\' 8"  (1.727 m)   Wt  154 lb 3.2 oz (69.9 kg)   SpO2 96%   BMI 23.45 kg/m  Wt Readings from Last 3 Encounters:  02/11/21 154 lb 3.2 oz (69.9  kg)  11/27/20 152 lb 9.6 oz (69.2 kg)  11/20/19 156 lb 6.4 oz (70.9 kg)    There are no preventive care reminders to display for this patient.  There are no preventive care reminders to display for this patient.       Assessment & Plan:   Problem List Items Addressed This Visit   None   Visit Diagnoses    Left arm pain    -  Primary   Relevant Orders   DG Humerus Left   DG Cervical Spine Complete   Ear fullness, left          Left arm pain: The pain that he is experiencing is in the area of the left humerus, but not the joint space.  We will obtain an x-ray of the humerus as well as an x-ray of the C-spine to determine if the pain could potentially be coming from there.  We will notify patient pending results.  In the meantime, Tylenol as needed for pain.  Left ear fullness: Resolved, no intervention.  Call the office with any questions or concerns.  We will follow-up pending results and sooner as needed. Kennyth Arnold, FNP

## 2021-02-11 NOTE — Patient Instructions (Signed)
Radicular Pain Radicular pain is a type of pain that spreads from your back or neck along a spinal nerve. Spinal nerves are nerves that leave the spinal cord and go to the muscles. Radicular pain is sometimes called radiculopathy, radiculitis, or a pinched nerve. When you have this type of pain, you may also have weakness, numbness, or tingling in the area of your body that is supplied by the nerve. The pain may feel sharp and burning. Depending on which spinal nerve is affected, the pain may occur in the:  Neck area (cervical radicular pain). You may also feel pain, numbness, weakness, or tingling in the arms.  Mid-spine area (thoracic radicular pain). You would feel this pain in the back and chest. This type is rare.  Lower back area (lumbar radicular pain). You would feel this pain as low back pain. You may feel pain, numbness, weakness, or tingling in the buttocks or legs. Sciatica is a type of lumbar radicular pain that shoots down the back of the leg. Radicular pain occurs when one of the spinal nerves becomes irritated or squeezed (compressed). It is often caused by something pushing on a spinal nerve, such as one of the bones of the spine (vertebrae) or one of the round cushions between vertebrae (intervertebral disks). This can result from:  An injury.  Wear and tear or aging of a disk.  The growth of a bone spur that pushes on the nerve. Radicular pain often goes away when you follow instructions from your health care provider for relieving pain at home. Follow these instructions at home: Managing pain  If directed, put ice on the affected area: ? Put ice in a plastic bag. ? Place a towel between your skin and the bag. ? Leave the ice on for 20 minutes, 2-3 times a day.  If directed, apply heat to the affected area as often as told by your health care provider. Use the heat source that your health care provider recommends, such as a moist heat pack or a heating pad. ? Place a towel  between your skin and the heat source. ? Leave the heat on for 20-30 minutes. ? Remove the heat if your skin turns bright red. This is especially important if you are unable to feel pain, heat, or cold. You may have a greater risk of getting burned.      Activity  Do not sit or rest in bed for long periods of time.  Try to stay as active as possible. Ask your health care provider what type of exercise or activity is best for you.  Avoid activities that make your pain worse, such as bending and lifting.  Do not lift anything that is heavier than 10 lb (4.5 kg), or the limit that you are told, until your health care provider says that it is safe.  Practice using proper technique when lifting items. Proper lifting technique involves bending your knees and rising up.  Do strength and range-of-motion exercises only as told by your health care provider or physical therapist.   General instructions  Take over-the-counter and prescription medicines only as told by your health care provider.  Pay attention to any changes in your symptoms.  Keep all follow-up visits as told by your health care provider. This is important. ? Your health care provider may send you to a physical therapist to help with this pain. Contact a health care provider if:  Your pain and other symptoms get worse.  Your pain medicine   is not helping.  Your pain has not improved after a few weeks of home care.  You have a fever. Get help right away if:  You have severe pain, weakness, or numbness.  You have difficulty with bladder or bowel control. Summary  Radicular pain is a type of pain that spreads from your back or neck along a spinal nerve.  When you have radicular pain, you may also have weakness, numbness, or tingling in the area of your body that is supplied by the nerve.  The pain may feel sharp or burning.  Radicular pain may be treated with ice, heat, medicines, or physical therapy. This  information is not intended to replace advice given to you by your health care provider. Make sure you discuss any questions you have with your health care provider. Document Revised: 04/12/2018 Document Reviewed: 04/12/2018 Elsevier Patient Education  2021 [employer] Inc[employer]  

## 2021-02-14 ENCOUNTER — Telehealth: Payer: Self-pay

## 2021-02-14 MED ORDER — PREDNISONE 10 MG (21) PO TBPK: ORAL_TABLET | ORAL | 0 refills | Status: DC

## 2021-02-14 NOTE — Telephone Encounter (Signed)
Called patient back gave results for arm do not see any information with neck. Not sure whos box it needed to be in to find note to document on can you send it to right person?

## 2021-02-14 NOTE — Telephone Encounter (Signed)
Patient returned call regarding XRAY results !!

## 2021-02-14 NOTE — Telephone Encounter (Signed)
I spoke with pt's wife to give message below. She voices understanding, and will give message to pt. Medication was sent to the wrong pharmacy, but agrees to pick it up and follow up with Dr. Yong Channel.

## 2021-02-14 NOTE — Addendum Note (Signed)
Addended by: Dutch Quint B on: 02/14/2021 11:15 AM   Modules accepted: Orders

## 2021-02-19 ENCOUNTER — Telehealth: Payer: Self-pay

## 2021-02-19 DIAGNOSIS — E7800 Pure hypercholesterolemia, unspecified: Secondary | ICD-10-CM

## 2021-02-19 MED ORDER — SIMVASTATIN 20 MG PO TABS: 20.0000 mg | ORAL_TABLET | ORAL | 3 refills | Status: DC

## 2021-02-19 NOTE — Telephone Encounter (Signed)
  LAST APPOINTMENT DATE: 02/14/2021   NEXT APPOINTMENT DATE:@Visit  date not found  MEDICATION:simvastatin (ZOCOR) 20 MG tablet  [place], [place] - [address], [address]  Comments: Patient is requesting a 46 day supply   Okay for refill?  Please advise

## 2021-02-19 NOTE — Telephone Encounter (Signed)
Refill sent.

## 2021-02-19 NOTE — Telephone Encounter (Signed)
Patient just spoke to [facility] and states they still have no received the refill request can we resend it

## 2021-02-20 MED ORDER — SIMVASTATIN 20 MG PO TABS: 20.0000 mg | ORAL_TABLET | ORAL | 3 refills | Status: DC

## 2021-02-20 MED ORDER — SIMVASTATIN 20 MG PO TABS: 20.0000 mg | ORAL_TABLET | Freq: Every day | ORAL | 3 refills | Status: DC

## 2021-02-20 NOTE — Addendum Note (Signed)
Addended by: Clyde Lundborg A on: 02/20/2021 08:31 AM   Modules accepted: Orders

## 2021-02-20 NOTE — Telephone Encounter (Signed)
Patient needs it to be everyday not every other day. Patient is completely out.

## 2021-02-20 NOTE — Telephone Encounter (Signed)
Refill re-sent  

## 2021-02-20 NOTE — Telephone Encounter (Signed)
Refill sent in for daily.

## 2021-02-20 NOTE — Addendum Note (Signed)
Addended by: Clyde Lundborg A on: 02/20/2021 02:12 PM   Modules accepted: Orders

## 2021-05-05 DIAGNOSIS — L570 Actinic keratosis: Secondary | ICD-10-CM | POA: Diagnosis not present

## 2021-06-11 DIAGNOSIS — N200 Calculus of kidney: Secondary | ICD-10-CM | POA: Diagnosis not present

## 2021-06-11 DIAGNOSIS — N281 Cyst of kidney, acquired: Secondary | ICD-10-CM | POA: Diagnosis not present

## 2021-09-25 DIAGNOSIS — H26491 Other secondary cataract, right eye: Secondary | ICD-10-CM | POA: Diagnosis not present

## 2021-09-25 DIAGNOSIS — H40013 Open angle with borderline findings, low risk, bilateral: Secondary | ICD-10-CM | POA: Diagnosis not present

## 2021-09-25 DIAGNOSIS — H353132 Nonexudative age-related macular degeneration, bilateral, intermediate dry stage: Secondary | ICD-10-CM | POA: Diagnosis not present

## 2021-09-25 DIAGNOSIS — H35363 Drusen (degenerative) of macula, bilateral: Secondary | ICD-10-CM | POA: Diagnosis not present

## 2021-10-15 ENCOUNTER — Ambulatory Visit: Payer: Medicare Other | Admitting: Podiatry

## 2021-10-15 ENCOUNTER — Encounter: Payer: Self-pay | Admitting: Podiatry

## 2021-10-15 ENCOUNTER — Other Ambulatory Visit: Payer: Self-pay

## 2021-10-15 VITALS — BP 137/76 | HR 72 | Temp 98.1°F

## 2021-10-15 DIAGNOSIS — M2011 Hallux valgus (acquired), right foot: Secondary | ICD-10-CM

## 2021-10-15 DIAGNOSIS — M79674 Pain in right toe(s): Secondary | ICD-10-CM

## 2021-10-15 DIAGNOSIS — M2012 Hallux valgus (acquired), left foot: Secondary | ICD-10-CM | POA: Diagnosis not present

## 2021-10-15 DIAGNOSIS — M79675 Pain in left toe(s): Secondary | ICD-10-CM

## 2021-10-15 DIAGNOSIS — B351 Tinea unguium: Secondary | ICD-10-CM | POA: Diagnosis not present

## 2021-10-15 DIAGNOSIS — E538 Deficiency of other specified B group vitamins: Secondary | ICD-10-CM | POA: Insufficient documentation

## 2021-10-15 NOTE — Progress Notes (Signed)
Subjective: Chad Fisher presents today referred by Marin Olp, MD for complaint of painful elongated mycotic toenails 1-5 bilaterally which are tender when wearing enclosed shoe gear. Discomfort present for over one month. He cannot reach his toes due to back problems.   He is accompanied by his wife on today's visit.  Past Medical History:  Diagnosis Date   Cancer [facility])    skin   Hernia 1987   right inguinal      Patient Active Problem List   Diagnosis Date Noted   Other B-complex deficiencies 10/15/2021   CKD (chronic kidney disease), stage III ([place]) 11/20/2019   Senile purpura ([place]) 11/17/2017   History of kidney stones 02/03/2016   Hypothyroidism 08/02/2013   Pernicious anemia 08/02/2013   Hyperlipidemia 12/20/2007   SKIN CANCER, HX OF 05/04/2007     Past Surgical History:  Procedure Laterality Date   CATARACT EXTRACTION, BILATERAL     HERNIA REPAIR     MOHS SURGERY     left ear     Current Outpatient Medications on File Prior to Visit  Medication Sig Dispense Refill   cyanocobalamin (,VITAMIN B-12,) 1000 MCG/ML injection INJECT 102mL INTRAMUSCULARLY EVERY 30 DAYS 10 mL 3   levothyroxine (SYNTHROID) 75 MCG tablet TAKE 1 TABLET BY MOUTH  DAILY 90 tablet 3   Multiple Vitamins-Minerals (ICAPS AREDS 2 PO) Take by mouth.     Potassium Citrate 15 MEQ (1620 MG) TBCR Take 1 tablet by mouth every 12 (twelve) hours.     predniSONE (STERAPRED UNI-PAK 21 TAB) 10 MG (21) TBPK tablet As directed 21 tablet 0   simvastatin (ZOCOR) 20 MG tablet Take 1 tablet (20 mg total) by mouth daily. 90 tablet 3   No current facility-administered medications on file prior to visit.     No Known Allergies   Social History   Occupational History   Not on file  Tobacco Use   Smoking status: Never   Smokeless tobacco: Never  Vaping Use   Vaping Use: Never used  Substance and Sexual Activity   Alcohol use: No    Alcohol/week: 0.0 standard drinks   Drug use: No   Sexual activity:  Not on file     Family History  Problem Relation Age of Onset   Anemia Father    Benign prostatic hyperplasia Brother    Cancer Daughter    Breast cancer Daughter    Kidney cancer Son        doing ok     Immunization History  Administered Date(s) Administered   Fluad Quad(high Dose 65+) 06/22/2019, 06/12/2020   Influenza Split 07/04/2014   Influenza Whole 10/12/2005, 07/26/2007, 07/12/2008, 07/15/2009, 07/29/2010, 07/12/2013   Influenza, High Dose Seasonal PF 07/02/2018, 06/13/2019   Influenza-Unspecified 07/13/2015, 07/24/2016, 07/13/2017, 07/02/2018   PFIZER(Purple Top)SARS-COV-2 Vaccination 10/31/2019, 11/21/2019, 07/09/2020   Pneumococcal Conjugate-13 08/09/2014   Pneumococcal Polysaccharide-23 10/12/2002, 07/15/2009   Td 10/12/2002   Tetanus 08/02/2013   Zoster Recombinat (Shingrix) 05/09/2020, 07/26/2020   Zoster, Live 08/19/2007     Objective: Chad Fisher is a pleasant 88 y.o. male WD, WN in NAD. AAO x 3.  Vitals:   10/15/21 1029  BP: 137/76  Pulse: 72  Temp: 98.1 F (36.7 C)    Vascular Examination:  CFT immediate b/l LE. Palpable DP/PT pulses b/l LE. Digital hair sparse b/l. Skin temperature gradient WNL b/l. No pain with calf compression b/l. No edema noted b/l. No cyanosis or clubbing noted b/l LE.  Dermatological Examination: Pedal skin  is warm and supple b/l LE. No open wounds b/l LE. No interdigital macerations noted b/l LE. Toenails 1-5 b/l elongated, discolored, dystrophic, thickened, crumbly with subungual debris and tenderness to dorsal palpation.  Musculoskeletal: Muscle strength 5/5 to all lower extremity muscle groups bilaterally. No pain, crepitus or joint limitation noted with ROM bilateral LE. HAV with bunion deformity noted b/l LE. Patient ambulates independent of any assistive aids.  Neurological: Protective sensation intact 5/5 intact bilaterally with 10g monofilament b/l. Vibratory sensation intact b/l.  Assessment: 1. Pain due to  onychomycosis of toenails of both feet   2. Hallux valgus, acquired, bilateral     Plan: -Examined patient. -Patient to continue soft, supportive shoe gear daily. -Mycotic toenails 1-5 bilaterally were debrided in length and girth with sterile nail nippers and dremel without incident. -Patient/POA to call should there be question/concern in the interim.  Return in about 3 months (around 01/13/2022).  Marzetta Board, DPM

## 2021-10-20 ENCOUNTER — Telehealth: Payer: Self-pay | Admitting: Family Medicine

## 2021-10-20 MED ORDER — CYANOCOBALAMIN 1000 MCG/ML IJ SOLN: INTRAMUSCULAR | 3 refills | Status: DC

## 2021-10-20 NOTE — Telephone Encounter (Signed)
Refill has been sent to [facility]  

## 2021-10-20 NOTE — Telephone Encounter (Signed)
Pt is calling in for a refill on cyanocobalamin (,VITAMIN B-12,) 1000 MCG/ML injection but he wants it sent to a different pharmacy this time. He wants it sent to [facility] [phone] Callback for patient if needed is [phone]

## 2021-11-05 DIAGNOSIS — D485 Neoplasm of uncertain behavior of skin: Secondary | ICD-10-CM | POA: Diagnosis not present

## 2021-11-05 DIAGNOSIS — L821 Other seborrheic keratosis: Secondary | ICD-10-CM | POA: Diagnosis not present

## 2021-11-05 DIAGNOSIS — L570 Actinic keratosis: Secondary | ICD-10-CM | POA: Diagnosis not present

## 2021-11-05 DIAGNOSIS — L820 Inflamed seborrheic keratosis: Secondary | ICD-10-CM | POA: Diagnosis not present

## 2021-12-01 ENCOUNTER — Encounter: Payer: Medicare Other | Admitting: Family Medicine

## 2021-12-10 ENCOUNTER — Other Ambulatory Visit: Payer: Self-pay | Admitting: Family Medicine

## 2021-12-10 DIAGNOSIS — E7800 Pure hypercholesterolemia, unspecified: Secondary | ICD-10-CM

## 2021-12-10 NOTE — Progress Notes (Signed)
Phone: [phone]   Subjective:  Patient presents today for their annual physical. Chief complaint-noted.   See problem oriented charting- ROS- full  review of systems was completed and negative  except for: bruising esily, muscle aches  The following were reviewed and entered/updated in epic: Past Medical History:  Diagnosis Date   Cancer ([place])    skin   Hernia 1987   right inguinal    Patient Active Problem List   Diagnosis Date Noted   CKD (chronic kidney disease), stage III ([place]) 11/20/2019    Priority: Medium    Hypothyroidism 08/02/2013    Priority: Medium    Pernicious anemia 08/02/2013    Priority: Medium    Hyperlipidemia 12/20/2007    Priority: Medium    Other B-complex deficiencies 10/15/2021   Senile purpura ([place]) 11/17/2017   History of kidney stones 02/03/2016   SKIN CANCER, HX OF 05/04/2007   Past Surgical History:  Procedure Laterality Date   CATARACT EXTRACTION, BILATERAL     HERNIA REPAIR     MOHS SURGERY     left ear    Family History  Problem Relation Age of Onset   Anemia Father    Benign prostatic hyperplasia Brother    Cancer Daughter    Breast cancer Daughter    Kidney cancer Son        doing ok    Medications- reviewed and updated Current Outpatient Medications  Medication Sig Dispense Refill   cyanocobalamin (,VITAMIN B-12,) 1000 MCG/ML injection INJECT 72mL INTRAMUSCULARLY EVERY 30 DAYS 10 mL 3   levothyroxine (SYNTHROID) 75 MCG tablet TAKE 1 TABLET BY MOUTH  DAILY 90 tablet 3   Multiple Vitamins-Minerals (ICAPS AREDS 2 PO) Take by mouth.     Potassium Citrate 15 MEQ (1620 MG) TBCR Take 1 tablet by mouth every 12 (twelve) hours.     simvastatin (ZOCOR) 20 MG tablet TAKE 1 TABLET BY MOUTH  DAILY 90 tablet 3   PFIZER COVID-19 VAC BIVALENT injection      No current facility-administered medications for this visit.    Allergies-reviewed and updated No Known Allergies  Social History   Social History Narrative   Married.  1 living son (daughter died of cancer). 1 grandchild. 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 3x a week mows with self propelled.       Hobbies: a lot of time at church- building ramps etc, enjoys sports- football, basketball, baseball.    Objective  Objective:  BP 110/64    Pulse (!) 56    Temp 98.7 F (37.1 C)    Ht 5\' 8"  (1.727 m)    Wt 151 lb 9.6 oz (68.8 kg)    SpO2 95%    BMI 23.05 kg/m  Gen: NAD, resting comfortably HEENT: Mucous membranes are moist. Oropharynx normal Neck: no thyromegaly CV: RRR no murmurs rubs or gallops Lungs: CTAB no crackles, wheeze, rhonchi Abdomen: soft/nontender/nondistended/normal bowel sounds. No rebound or guarding.  Ext: no edema Skin: warm, dry Neuro: grossly normal, moves all extremities, PERRLA   Assessment and Plan  88 y.o. male presenting for annual physical.  Health Maintenance counseling: 1. Anticipatory guidance: Patient counseled regarding regular dental exams -q6 months, eye exams -yearly,  avoiding smoking and second hand smoke , limiting alcohol to 2 beverages per day -doesn't drink.  No illicit drugs 2. Risk factor reduction:  Advised patient of need for regular exercise and diet rich and fruits and vegetables to reduce risk of heart attack and stroke.  Exercise- active around the house- cares for his own yard- prefers this over options like walking..  Diet/weight management- reasonably healthy.  Wt Readings from Last 3 Encounters:  12/16/21 151 lb 9.6 oz (68.8 kg)  02/11/21 154 lb 3.2 oz (69.9 kg)  11/27/20 152 lb 9.6 oz (69.2 kg)  3. Immunizations/screenings/ancillary studies- up to date Immunization History  Administered Date(s) Administered   Fluad Quad(high Dose 65+) 06/22/2019, 06/12/2020   Influenza Split 07/04/2014   Influenza Whole 10/12/2005, 07/26/2007,  07/12/2008, 07/15/2009, 07/29/2010, 07/12/2013   Influenza, High Dose Seasonal PF 07/02/2018, 06/13/2019, 07/26/2021   Influenza-Unspecified 07/13/2015, 07/24/2016, 07/13/2017, 07/02/2018   PFIZER(Purple Top)SARS-COV-2 Vaccination 10/31/2019, 11/21/2019, 07/09/2020, 04/15/2021   Pfizer Covid-19 Vaccine Bivalent Booster 75yrs & up 08/08/2021   Pneumococcal Conjugate-13 08/09/2014   Pneumococcal Polysaccharide-23 10/12/2002, 07/15/2009   Td 10/12/2002   Tetanus 08/02/2013   Zoster Recombinat (Shingrix) 05/09/2020, 07/26/2020   Zoster, Live 08/19/2007   4. Prostate cancer screening-  follows with urology Dr. Diona Fanti for history of kidney stones. Also on potassium citrate. Past age based screening recs for prostate cancer- they have stopped checking at urology as well  Lab Results  Component Value Date   PSA 0.82 08/09/2014   PSA 0.90 08/02/2013   PSA 0.65 11/03/2011   5. Colon cancer screening -  no blood in stool. Colonoscopy 2008 and was told no repeat due to age per his report.  6. Skin cancer screening-follows with Dr. Ronnald Ramp every 6 months due to history of skin cancer. Had recent removal.  advised regular sunscreen use. Denies worrisome, changing, or new skin lesions.  7. Smoking associated screening (lung cancer screening, AAA screen 65-75, UA)- never smoker 8. STD screening -  only active with wife- not needed  Status of chronic or acute concerns   #Hyperlipidemia S: Medication: Simvastatin 20 mg daily  . In 2019 we stopped aspirin due to bleeding with dental work and easy bruising. Lab Results  Component Value Date   CHOL 176 11/27/2020   HDL 48.20 11/27/2020   LDLCALC 110 (H) 11/27/2020   LDLDIRECT 187.8 12/02/2006   TRIG 89.0 11/27/2020   CHOLHDL 4 11/27/2020   A/P: hopefully improved but even if not unlikely to further increase medicine given age over age 48.   # Hypothyroidism S: compliant On thyroid medication- levothyroxicine 75 mcg  Lab Results  Component Value  Date   TSH 1.79 11/27/2020   A/P:hopefully stable- update tsh today. Continue current meds for now    #Pernicious anemia/B12 deficiency S: Patient self injects on a monthly basis-unfortunately he had to pay the full cost as his insurance does not cover Lab Results  Component Value Date   VITAMINB12 388 11/27/2020   A/P: hopefully stable- update b12 today. Continue current meds for now- id say unless formally low- shot was yesterday but just under 1025mcg    #Chronic kidney disease stage III S: GFR is typically in the 50s  range -Patient knows to avoid NSAIDs - ideally tylenol A/P: hopefully stable- update cmp today.    #senile purpura- noted. Stable. Check cbc at least annually -better off aspirin  #left arm pain last may- had x-rays of the arm and the cervical spine- some arthritis in spine but left upper arm/humerus was normal- left side resolved now with intermittent right upper arm pain that comes  and goes- worse with holding arm up- overall mild and does not want to work up further at this time  Recommended follow up: No follow-ups on file. Future Appointments  Date Time Provider [place]  01/14/2022 11:00 AM Marzetta Board, DPM [facility]  02/05/2022  8:45 AM [facility] HEALTH COACH [facility]   Lab/Order associations: fasting after breakfast at 8 - and labs around 4 pm   ICD-10-CM   1. Preventative health care  Z00.00     2. Hyperlipidemia, unspecified hyperlipidemia type  E78.5     3. Hypothyroidism, unspecified type  E03.9     4. Stage 3 chronic kidney disease, unspecified whether stage 3a or 3b CKD (HCC)  N18.30     5. Pernicious anemia  D51.0     6. B12 deficiency  E53.8       No orders of the defined types were placed in this encounter.   I,Jada Bradford,acting as a scribe for Garret Reddish, MD.,have documented all relevant documentation on the behalf of Garret Reddish, MD,as directed by  Garret Reddish, MD while in the presence of  Garret Reddish, MD.  I, Garret Reddish, MD, have reviewed all documentation for this visit. The documentation on 12/16/21 for the exam, diagnosis, procedures, and orders are all accurate and complete.  Return precautions advised.  Garret Reddish, MD

## 2021-12-16 ENCOUNTER — Encounter: Payer: Self-pay | Admitting: Family Medicine

## 2021-12-16 ENCOUNTER — Ambulatory Visit (INDEPENDENT_AMBULATORY_CARE_PROVIDER_SITE_OTHER): Payer: Medicare Other | Admitting: Family Medicine

## 2021-12-16 VITALS — BP 110/64 | HR 56 | Temp 98.7°F | Ht 68.0 in | Wt 151.6 lb

## 2021-12-16 DIAGNOSIS — D510 Vitamin B12 deficiency anemia due to intrinsic factor deficiency: Secondary | ICD-10-CM | POA: Diagnosis not present

## 2021-12-16 DIAGNOSIS — N183 Chronic kidney disease, stage 3 unspecified: Secondary | ICD-10-CM | POA: Diagnosis not present

## 2021-12-16 DIAGNOSIS — D692 Other nonthrombocytopenic purpura: Secondary | ICD-10-CM

## 2021-12-16 DIAGNOSIS — E039 Hypothyroidism, unspecified: Secondary | ICD-10-CM

## 2021-12-16 DIAGNOSIS — E538 Deficiency of other specified B group vitamins: Secondary | ICD-10-CM | POA: Diagnosis not present

## 2021-12-16 DIAGNOSIS — E785 Hyperlipidemia, unspecified: Secondary | ICD-10-CM | POA: Diagnosis not present

## 2021-12-16 DIAGNOSIS — Z0000 Encounter for general adult medical examination without abnormal findings: Secondary | ICD-10-CM

## 2021-12-16 NOTE — Patient Instructions (Addendum)
Please stop by lab before you go ?If you have mychart- we will send your results within 3 business days of [place] receiving them.  ?If you do not have mychart- we will call you about results within 5 business days of [place] receiving them.  ?*please also note that you will see labs on mychart as soon as they post. I will later go in and write notes on them- will say "notes from Dr. Yong Channel"  ? ?Recommended follow up: Return in about 1 year (around 12/17/2022) for physical or sooner if needed. ?

## 2021-12-17 LAB — COMPREHENSIVE METABOLIC PANEL
ALT: 10 U/L (ref 0–53)
AST: 17 U/L (ref 0–37)
Albumin: 4.2 g/dL (ref 3.5–5.2)
Alkaline Phosphatase: 87 U/L (ref 39–117)
BUN: 21 mg/dL (ref 6–23)
CO2: 29 mEq/L (ref 19–32)
Calcium: 9.2 mg/dL (ref 8.4–10.5)
Chloride: 105 mEq/L (ref 96–112)
Creatinine, Ser: 1.07 mg/dL (ref 0.40–1.50)
GFR: 62.83 mL/min (ref >60.00)
Glucose, Bld: 91 mg/dL (ref 70–99)
Potassium: 5 mEq/L (ref 3.5–5.1)
Sodium: 144 mEq/L (ref 135–145)
Total Bilirubin: 0.6 mg/dL (ref 0.2–1.2)
Total Protein: 6.8 g/dL (ref 6.0–8.3)

## 2021-12-17 LAB — CBC WITH DIFFERENTIAL/PLATELET
Basophils Absolute: 0.1 10*3/uL (ref 0.0–0.1)
Basophils Relative: 1.3 % (ref 0.0–3.0)
Eosinophils Absolute: 0.3 10*3/uL (ref 0.0–0.7)
Eosinophils Relative: 5 % (ref 0.0–5.0)
HCT: 38.7 % — ABNORMAL LOW (ref 39.0–52.0)
Hemoglobin: 12.9 g/dL — ABNORMAL LOW (ref 13.0–17.0)
Lymphocytes Relative: 25.6 % (ref 12.0–46.0)
Lymphs Abs: 1.8 10*3/uL (ref 0.7–4.0)
MCHC: 33.2 g/dL (ref 30.0–36.0)
MCV: 91.6 fl (ref 78.0–100.0)
Monocytes Absolute: 0.6 10*3/uL (ref 0.1–1.0)
Monocytes Relative: 8.5 % (ref 3.0–12.0)
Neutro Abs: 4.1 10*3/uL (ref 1.4–7.7)
Neutrophils Relative %: 59.6 % (ref 43.0–77.0)
Platelets: 172 10*3/uL (ref 150.0–400.0)
RBC: 4.22 Mil/uL (ref 4.22–5.81)
RDW: 13.6 % (ref 11.5–15.5)
WBC: 6.9 10*3/uL (ref 4.0–10.5)

## 2021-12-17 LAB — LIPID PANEL
Cholesterol: 147 mg/dL (ref 0–200)
HDL: 50.6 mg/dL (ref >39.00)
LDL Cholesterol: 75 mg/dL (ref 0–99)
NonHDL: 96.1
Total CHOL/HDL Ratio: 3
Triglycerides: 105 mg/dL (ref 0.0–149.0)
VLDL: 21 mg/dL (ref 0.0–40.0)

## 2021-12-17 LAB — TSH: TSH: 1.06 u[IU]/mL (ref 0.35–5.50)

## 2021-12-17 LAB — VITAMIN B12: Vitamin B-12: >1504 pg/mL — ABNORMAL HIGH (ref 211–911)

## 2021-12-29 ENCOUNTER — Telehealth: Payer: Self-pay | Admitting: Family Medicine

## 2021-12-29 NOTE — Telephone Encounter (Signed)
Please call pt with lab results - stated he cannot be reached via cell phone has he does not check or hear it. - he would prefer a detailed voice mail to be left on [phone].  ? ?Thank you.  ?

## 2021-12-30 ENCOUNTER — Other Ambulatory Visit: Payer: Self-pay

## 2021-12-30 DIAGNOSIS — D510 Vitamin B12 deficiency anemia due to intrinsic factor deficiency: Secondary | ICD-10-CM

## 2021-12-30 NOTE — Telephone Encounter (Signed)
Called and spoke wit pt and labs reviewed.  ?

## 2022-01-14 ENCOUNTER — Encounter: Payer: Self-pay | Admitting: Podiatry

## 2022-01-14 ENCOUNTER — Ambulatory Visit: Payer: Medicare Other | Admitting: Podiatry

## 2022-01-14 DIAGNOSIS — M79675 Pain in left toe(s): Secondary | ICD-10-CM

## 2022-01-14 DIAGNOSIS — B351 Tinea unguium: Secondary | ICD-10-CM | POA: Diagnosis not present

## 2022-01-14 DIAGNOSIS — M79674 Pain in right toe(s): Secondary | ICD-10-CM | POA: Diagnosis not present

## 2022-01-19 NOTE — Progress Notes (Signed)
?  Subjective:  ?Patient ID: Chad Fisher, male    DOB: 09-May-1933,  MRN: [id number] ? ?Marcelo Baldy presents to clinic today for painful thick toenails that are difficult to trim. Pain interferes with ambulation. Aggravating factors include wearing enclosed shoe gear. Pain is relieved with periodic professional debridement. ? ?He is requested toenails be rounded off today. ? ?New problem(s): None.  ? ?PCP is Marin Olp, MD , and last visit was December 16, 2021. ? ?No Known Allergies ? ?Review of Systems: Negative except as noted in the HPI. ? ?Objective: No changes noted in today's physical examination. ?Chad Fisher is a pleasant 88 y.o. male WD, WN in NAD. AAO x 3. ? ?Vascular Examination:  ?CFT immediate b/l LE. Palpable DP/PT pulses b/l LE. Digital hair sparse b/l. Skin temperature gradient WNL b/l. No pain with calf compression b/l. No edema noted b/l. No cyanosis or clubbing noted b/l LE. ? ?Dermatological Examination: ?Pedal skin is warm and supple b/l LE. No open wounds b/l LE. No interdigital macerations noted b/l LE. Toenails 1-5 b/l elongated, discolored, dystrophic, thickened, crumbly with subungual debris and tenderness to dorsal palpation. ? ?Musculoskeletal: ?Muscle strength 5/5 to all lower extremity muscle groups bilaterally. No pain, crepitus or joint limitation noted with ROM bilateral LE. HAV with bunion deformity noted b/l LE. Patient ambulates independent of any assistive aids. ? ?Neurological: ?Protective sensation intact 5/5 intact bilaterally with 10g monofilament b/l. Vibratory sensation intact b/l. ? ?Assessment/Plan: ?1. Pain due to onychomycosis of toenails of both feet   ?-No new findings. No new orders. ?-Toenails 1-5 b/l were debrided in length and girth with sterile nail nippers and dremel without iatrogenic bleeding.  ?-Patient/POA to call should there be question/concern in the interim.  ? ?Return in about 9 weeks (around 03/18/2022). ? ?Marzetta Board, DPM  ?

## 2022-01-27 ENCOUNTER — Other Ambulatory Visit (INDEPENDENT_AMBULATORY_CARE_PROVIDER_SITE_OTHER): Payer: Medicare Other

## 2022-01-27 ENCOUNTER — Other Ambulatory Visit: Payer: Medicare Other

## 2022-01-27 DIAGNOSIS — D510 Vitamin B12 deficiency anemia due to intrinsic factor deficiency: Secondary | ICD-10-CM

## 2022-01-27 LAB — CBC WITH DIFFERENTIAL/PLATELET
Basophils Absolute: 0.1 10*3/uL (ref 0.0–0.1)
Basophils Relative: 1 % (ref 0.0–3.0)
Eosinophils Absolute: 0.5 10*3/uL (ref 0.0–0.7)
Eosinophils Relative: 7.6 % — ABNORMAL HIGH (ref 0.0–5.0)
HCT: 39.1 % (ref 39.0–52.0)
Hemoglobin: 13 g/dL (ref 13.0–17.0)
Lymphocytes Relative: 24.3 % (ref 12.0–46.0)
Lymphs Abs: 1.5 10*3/uL (ref 0.7–4.0)
MCHC: 33.1 g/dL (ref 30.0–36.0)
MCV: 91.1 fl (ref 78.0–100.0)
Monocytes Absolute: 0.7 10*3/uL (ref 0.1–1.0)
Monocytes Relative: 10.7 % (ref 3.0–12.0)
Neutro Abs: 3.5 10*3/uL (ref 1.4–7.7)
Neutrophils Relative %: 56.4 % (ref 43.0–77.0)
Platelets: 170 10*3/uL (ref 150.0–400.0)
RBC: 4.3 Mil/uL (ref 4.22–5.81)
RDW: 13.8 % (ref 11.5–15.5)
WBC: 6.2 10*3/uL (ref 4.0–10.5)

## 2022-02-05 ENCOUNTER — Ambulatory Visit: Payer: Medicare Other

## 2022-03-18 ENCOUNTER — Encounter: Payer: Self-pay | Admitting: Podiatry

## 2022-03-18 ENCOUNTER — Ambulatory Visit: Payer: Medicare Other | Admitting: Podiatry

## 2022-03-18 DIAGNOSIS — B351 Tinea unguium: Secondary | ICD-10-CM

## 2022-03-18 DIAGNOSIS — M79675 Pain in left toe(s): Secondary | ICD-10-CM | POA: Diagnosis not present

## 2022-03-18 DIAGNOSIS — N183 Chronic kidney disease, stage 3 unspecified: Secondary | ICD-10-CM

## 2022-03-18 DIAGNOSIS — M2011 Hallux valgus (acquired), right foot: Secondary | ICD-10-CM

## 2022-03-18 DIAGNOSIS — M79674 Pain in right toe(s): Secondary | ICD-10-CM

## 2022-03-18 NOTE — Progress Notes (Signed)
This patient returns to my office for at risk foot care.  This patient requires this care by a professional since this patient will be at risk due to having  CKD.    This patient is unable to cut nails himself since the patient cannot reach his nails.These nails are painful walking and wearing shoes.  This patient presents for at risk foot care today.  General Appearance  Alert, conversant and in no acute stress.  Vascular  Dorsalis pedis and posterior tibial  pulses are palpable  bilaterally.  Capillary return is within normal limits  bilaterally. Temperature is within normal limits  bilaterally.  Neurologic  Senn-Weinstein monofilament wire test within normal limits  bilaterally. Muscle power within normal limits bilaterally.  Nails Thick disfigured discolored nails with subungual debris  from hallux to fifth toes bilaterally. No evidence of bacterial infection or drainage bilaterally.  Orthopedic  No limitations of motion  feet .  No crepitus or effusions noted.  No bony pathology or digital deformities noted.  HAV  B/L.  Skin  normotropic skin with no porokeratosis noted bilaterally.  No signs of infections or ulcers noted.     Onychomycosis  Pain in right toes  Pain in left toes  Consent was obtained for treatment procedures.   Mechanical debridement of nails 1-5  bilaterally performed with a nail nipper.  Filed with dremel without incident.    Return office visit    9 weeks                  Told patient to return for periodic foot care and evaluation due to potential at risk complications.   Etheline Geppert DPM   

## 2022-03-23 ENCOUNTER — Telehealth: Payer: Self-pay | Admitting: Family Medicine

## 2022-03-23 NOTE — Telephone Encounter (Signed)
Copied from [place] [phone]. Topic: Medicare AWV >> Mar 23, 2022 11:58 AM Devoria Glassing wrote: Reason for CRM: Left message for patient to schedule Annual Wellness Visit.  Please schedule with Nurse Health Advisor Charlott Rakes, RN at [facility].  Please call [phone] ask for [facility]

## 2022-05-05 DIAGNOSIS — L738 Other specified follicular disorders: Secondary | ICD-10-CM | POA: Diagnosis not present

## 2022-05-05 DIAGNOSIS — Z85828 Personal history of other malignant neoplasm of skin: Secondary | ICD-10-CM | POA: Diagnosis not present

## 2022-05-05 DIAGNOSIS — L570 Actinic keratosis: Secondary | ICD-10-CM | POA: Diagnosis not present

## 2022-05-05 DIAGNOSIS — D692 Other nonthrombocytopenic purpura: Secondary | ICD-10-CM | POA: Diagnosis not present

## 2022-05-05 DIAGNOSIS — L821 Other seborrheic keratosis: Secondary | ICD-10-CM | POA: Diagnosis not present

## 2022-05-25 ENCOUNTER — Encounter: Payer: Self-pay | Admitting: Podiatry

## 2022-05-25 ENCOUNTER — Ambulatory Visit: Payer: Medicare Other | Admitting: Podiatry

## 2022-05-25 DIAGNOSIS — M79675 Pain in left toe(s): Secondary | ICD-10-CM

## 2022-05-25 DIAGNOSIS — M2011 Hallux valgus (acquired), right foot: Secondary | ICD-10-CM | POA: Diagnosis not present

## 2022-05-25 DIAGNOSIS — B351 Tinea unguium: Secondary | ICD-10-CM | POA: Diagnosis not present

## 2022-05-25 DIAGNOSIS — N183 Chronic kidney disease, stage 3 unspecified: Secondary | ICD-10-CM

## 2022-05-25 DIAGNOSIS — M2012 Hallux valgus (acquired), left foot: Secondary | ICD-10-CM | POA: Diagnosis not present

## 2022-05-25 DIAGNOSIS — M79674 Pain in right toe(s): Secondary | ICD-10-CM | POA: Diagnosis not present

## 2022-05-25 NOTE — Progress Notes (Signed)
This patient returns to my office for at risk foot care.  This patient requires this care by a professional since this patient will be at risk due to having  CKD.    This patient is unable to cut nails himself since the patient cannot reach his nails.These nails are painful walking and wearing shoes.  This patient presents for at risk foot care today.  General Appearance  Alert, conversant and in no acute stress.  Vascular  Dorsalis pedis and posterior tibial  pulses are palpable  bilaterally.  Capillary return is within normal limits  bilaterally. Temperature is within normal limits  bilaterally.  Neurologic  Senn-Weinstein monofilament wire test within normal limits  bilaterally. Muscle power within normal limits bilaterally.  Nails Thick disfigured discolored nails with subungual debris  from hallux to fifth toes bilaterally. No evidence of bacterial infection or drainage bilaterally.  Orthopedic  No limitations of motion  feet .  No crepitus or effusions noted.  No bony pathology or digital deformities noted.  HAV  B/L.  Skin  normotropic skin with no porokeratosis noted bilaterally.  No signs of infections or ulcers noted.     Onychomycosis  Pain in right toes  Pain in left toes  Consent was obtained for treatment procedures.   Mechanical debridement of nails 1-5  bilaterally performed with a nail nipper.  Filed with dremel without incident.    Return office visit    9 weeks                  Told patient to return for periodic foot care and evaluation due to potential at risk complications.   Gardiner Barefoot DPM

## 2022-06-10 DIAGNOSIS — N281 Cyst of kidney, acquired: Secondary | ICD-10-CM | POA: Diagnosis not present

## 2022-06-10 DIAGNOSIS — N200 Calculus of kidney: Secondary | ICD-10-CM | POA: Diagnosis not present

## 2022-07-08 ENCOUNTER — Telehealth: Payer: Self-pay | Admitting: Family Medicine

## 2022-07-08 NOTE — Telephone Encounter (Signed)
FYI  Patient received flu shot today. Will update on when given COVID booster.

## 2022-07-08 NOTE — Telephone Encounter (Signed)
Flu shot documented

## 2022-07-09 DIAGNOSIS — C44229 Squamous cell carcinoma of skin of left ear and external auricular canal: Secondary | ICD-10-CM | POA: Diagnosis not present

## 2022-07-09 DIAGNOSIS — C4442 Squamous cell carcinoma of skin of scalp and neck: Secondary | ICD-10-CM | POA: Diagnosis not present

## 2022-07-09 DIAGNOSIS — L570 Actinic keratosis: Secondary | ICD-10-CM | POA: Diagnosis not present

## 2022-07-20 NOTE — Telephone Encounter (Signed)
Noted  

## 2022-07-20 NOTE — Telephone Encounter (Signed)
Patient states he received his Covid 29 Booster vaccine on 07/10/22

## 2022-07-27 ENCOUNTER — Encounter: Payer: Self-pay | Admitting: Podiatry

## 2022-07-27 ENCOUNTER — Ambulatory Visit: Payer: Medicare Other | Admitting: Podiatry

## 2022-07-27 DIAGNOSIS — B351 Tinea unguium: Secondary | ICD-10-CM

## 2022-07-27 DIAGNOSIS — M79675 Pain in left toe(s): Secondary | ICD-10-CM

## 2022-07-27 DIAGNOSIS — M79674 Pain in right toe(s): Secondary | ICD-10-CM

## 2022-07-27 NOTE — Progress Notes (Unsigned)
  Subjective:  Patient ID: Chad Fisher, male    DOB: 1933/05/10,  MRN: [id number]  Chad Fisher presents to clinic today for:  Chief Complaint  Patient presents with   Nail Problem    Routine foot care PCP-Stephen Hunter PCP VST-Couple months ago   New problem(s): None. {jgcomplaint:23593}  PCP is Marin Olp, MD , and last visit was  {Time; dates multiple:15870}.  No Known Allergies  Review of Systems: Negative except as noted in the HPI.  Objective: No changes noted in today's physical examination.  Chad Fisher is a pleasant 88 y.o. male {jgbodyhabitus:24098} AAO x 3. Vascular Examination:  CFT immediate b/l LE. Palpable DP/PT pulses b/l LE. Digital hair sparse b/l. Skin temperature gradient WNL b/l. No pain with calf compression b/l. No edema noted b/l. No cyanosis or clubbing noted b/l LE.  Dermatological Examination: Pedal skin is warm and supple b/l LE. No open wounds b/l LE. No interdigital macerations noted b/l LE. Toenails 1-5 b/l elongated, discolored, dystrophic, thickened, crumbly with subungual debris and tenderness to dorsal palpation.  Musculoskeletal: Muscle strength 5/5 to all lower extremity muscle groups bilaterally. No pain, crepitus or joint limitation noted with ROM bilateral LE. HAV with bunion deformity noted b/l LE. Patient ambulates independent of any assistive aids.  Neurological: Protective sensation intact 5/5 intact bilaterally with 10g monofilament b/l. Vibratory sensation intact b/l. Assessment/Plan: No diagnosis found.  No orders of the defined types were placed in this encounter.   {Jgplan:23602::"-Patient/POA to call should there be question/concern in the interim."}   Return in about 3 months (around 10/27/2022).  Marzetta Board, DPM

## 2022-07-28 DIAGNOSIS — C44219 Basal cell carcinoma of skin of left ear and external auricular canal: Secondary | ICD-10-CM | POA: Diagnosis not present

## 2022-09-28 ENCOUNTER — Encounter: Payer: Self-pay | Admitting: Podiatry

## 2022-09-28 ENCOUNTER — Ambulatory Visit: Payer: Medicare Other | Admitting: Podiatry

## 2022-09-28 VITALS — BP 106/60

## 2022-09-28 DIAGNOSIS — B351 Tinea unguium: Secondary | ICD-10-CM | POA: Diagnosis not present

## 2022-09-28 DIAGNOSIS — M79674 Pain in right toe(s): Secondary | ICD-10-CM

## 2022-09-28 DIAGNOSIS — M79675 Pain in left toe(s): Secondary | ICD-10-CM

## 2022-09-28 NOTE — Progress Notes (Unsigned)
  Subjective:  Patient ID: Chad Fisher, male    DOB: October 10, 1933,  MRN: [id number]  Chad Fisher presents to clinic today for {jgcomplaint:23593}  Chief Complaint  Patient presents with   Nail Problem    [facility] PCP-Stephen Hunter PCP VST-06/2022   New problem(s): None. {jgcomplaint:23593}  PCP is Marin Olp, MD.  No Known Allergies  Review of Systems: Negative except as noted in the HPI.  Objective: No changes noted in today's physical examination. Vitals:   09/28/22 1415  BP: 106/60   Chad Fisher is a pleasant 88 y.o. male {jgbodyhabitus:24098} AAO x 3.  Vascular Examination:  CFT immediate b/l LE. Palpable DP/PT pulses b/l LE. Digital hair sparse b/l. Skin temperature gradient WNL b/l. No pain with calf compression b/l. No edema noted b/l. No cyanosis or clubbing noted b/l LE.  Dermatological Examination: Pedal skin is warm and supple b/l LE. No open wounds b/l LE. No interdigital macerations noted b/l LE. Toenails 1-5 b/l elongated, discolored, dystrophic, thickened, crumbly with subungual debris and tenderness to dorsal palpation.  Musculoskeletal: Muscle strength 5/5 to all lower extremity muscle groups bilaterally. No pain, crepitus or joint limitation noted with ROM bilateral LE. HAV with bunion deformity noted b/l LE. Patient ambulates independent of any assistive aids.  Neurological: Protective sensation intact 5/5 intact bilaterally with 10g monofilament b/l. Vibratory sensation intact b/l.  Assessment/Plan: 1. Pain due to onychomycosis of toenails of both feet     No orders of the defined types were placed in this encounter.   None {Jgplan:23602::"-Patient/POA to call should there be question/concern in the interim."}   Return in about 3 months (around 12/28/2022).  Marzetta Board, DPM

## 2022-10-06 ENCOUNTER — Other Ambulatory Visit: Payer: Self-pay | Admitting: Family Medicine

## 2022-10-06 DIAGNOSIS — E7800 Pure hypercholesterolemia, unspecified: Secondary | ICD-10-CM

## 2022-10-15 DIAGNOSIS — H04123 Dry eye syndrome of bilateral lacrimal glands: Secondary | ICD-10-CM | POA: Diagnosis not present

## 2022-10-15 DIAGNOSIS — H353132 Nonexudative age-related macular degeneration, bilateral, intermediate dry stage: Secondary | ICD-10-CM | POA: Diagnosis not present

## 2022-10-15 DIAGNOSIS — H35363 Drusen (degenerative) of macula, bilateral: Secondary | ICD-10-CM | POA: Diagnosis not present

## 2022-10-15 DIAGNOSIS — H524 Presbyopia: Secondary | ICD-10-CM | POA: Diagnosis not present

## 2022-10-15 DIAGNOSIS — H40013 Open angle with borderline findings, low risk, bilateral: Secondary | ICD-10-CM | POA: Diagnosis not present

## 2022-11-05 DIAGNOSIS — L821 Other seborrheic keratosis: Secondary | ICD-10-CM | POA: Diagnosis not present

## 2022-11-05 DIAGNOSIS — L820 Inflamed seborrheic keratosis: Secondary | ICD-10-CM | POA: Diagnosis not present

## 2022-11-05 DIAGNOSIS — D225 Melanocytic nevi of trunk: Secondary | ICD-10-CM | POA: Diagnosis not present

## 2022-11-05 DIAGNOSIS — Z85828 Personal history of other malignant neoplasm of skin: Secondary | ICD-10-CM | POA: Diagnosis not present

## 2022-11-05 DIAGNOSIS — L570 Actinic keratosis: Secondary | ICD-10-CM | POA: Diagnosis not present

## 2022-11-05 DIAGNOSIS — C44319 Basal cell carcinoma of skin of other parts of face: Secondary | ICD-10-CM | POA: Diagnosis not present

## 2022-11-05 DIAGNOSIS — D485 Neoplasm of uncertain behavior of skin: Secondary | ICD-10-CM | POA: Diagnosis not present

## 2022-11-11 ENCOUNTER — Ambulatory Visit (INDEPENDENT_AMBULATORY_CARE_PROVIDER_SITE_OTHER): Payer: Medicare Other | Admitting: Internal Medicine

## 2022-11-11 ENCOUNTER — Encounter: Payer: Self-pay | Admitting: Internal Medicine

## 2022-11-11 VITALS — BP 135/63 | HR 74 | Temp 98.2°F | Ht 68.0 in | Wt 150.4 lb

## 2022-11-11 DIAGNOSIS — Z87442 Personal history of urinary calculi: Secondary | ICD-10-CM | POA: Insufficient documentation

## 2022-11-11 DIAGNOSIS — J040 Acute laryngitis: Secondary | ICD-10-CM | POA: Diagnosis not present

## 2022-11-11 NOTE — Progress Notes (Signed)
[facility]: [phone]   Routine Medical Office Visit  Patient:  Chad Fisher      Age: 89 y.o.       Sex:  male  Date:   11/11/2022  PCP:    Marin Olp, Corazon Provider: Loralee Pacas, MD   Problem Focused Charting:   Medical Decision Making per Assessment/Plan   Hashir was seen today for ongoing cough, hoarse and no fever.  Acute laryngitis   Throat exam negative.  I created the following printout and printed for patient  I believe you have acute laryngitis which is inflammation of the vocal cords causing your hoarseness cough and chest congestion.  I believe that it would go away on its own in about 7 to 10 days and if it does not you need to follow-up for direct visualization with an ear nose and throat physician to make sure it is nothing else.  I believe that it might clear up quicker if we took antibiotics but in general we do not recommend them due to they have risks and it clears up on its own usually in just 7 days or less.  I believe that this is most likely viral but it is possibly that there is some allergic inflammation of the vocal cords perhaps to the recent cheek surgery medication there could be an allergy to that and so taking Benadryl and Claritin might help but.  I do think it could be infectious though-please keep treating it as though you are infectious even though there is a more than a small chance that it is just a reaction to the medication from the biopsies  I recommend that while you are resting that you do not do anything that uses your voice or make to project her voice loudly that you stay hydrated make sure plenty of fluid washes a down your mouth and try steam therapy like hot showers to improve the hoarseness  He declined antibiotic(s) that I recommended against.  On retrospect I think most likely this is a local vocal cord reaction to recent cheek surgery lidocaine injection     Subjective - Clinical  Presentation:   Chad Fisher is a 89 y.o. male  Patient Active Problem List   Diagnosis Date Noted   Personal history of urinary calculi 11/11/2022   Other B-complex deficiencies 10/15/2021   CKD (chronic kidney disease), stage III ([place]) 11/20/2019   Senile purpura ([place]) 11/17/2017   History of kidney stones 02/03/2016   Hypothyroidism 08/02/2013   Pernicious anemia 08/02/2013   Hyperlipidemia 12/20/2007   SKIN CANCER, HX OF 05/04/2007   Past Medical History:  Diagnosis Date   Cancer ([facility])    skin   Hernia 1987   right inguinal     Outpatient Medications Prior to Visit  Medication Sig   cyanocobalamin (,VITAMIN B-12,) 1000 MCG/ML injection INJECT 74m INTRAMUSCULARLY EVERY 30 DAYS   FLUZONE HIGH-DOSE QUADRIVALENT 0.7 ML SUSY    levothyroxine (SYNTHROID) 75 MCG tablet TAKE 1 TABLET BY MOUTH DAILY   Multiple Vitamins-Minerals (ICAPS AREDS 2 PO) Take by mouth.   Potassium Citrate 15 MEQ (1620 MG) TBCR Take 1 tablet by mouth every 12 (twelve) hours.   simvastatin (ZOCOR) 20 MG tablet TAKE 1 TABLET BY MOUTH DAILY   SPIKEVAX syringe    No facility-administered medications prior to visit.    Chief Complaint  Patient presents with   Ongoing cough    For about  a  week-dry.   Hoarse   No fever    HPI  No symptom(s) except nonproductive throat clearing cough, mild hoarseness, sense of chest congestion Recent cheek surgery  Symptom(s) not severe         Objective:  Physical Exam  BP 135/63 (BP Location: Left Arm, Patient Position: Sitting)   Pulse 74   Temp 98.2 F (36.8 C) (Temporal)   Ht '5\' 8"'$  (1.727 m)   Wt 150 lb 6.4 oz (68.2 kg)   SpO2 97%   BMI 22.87 kg/m  Well developed, well nourished  by BMI criteria but truncal adiposity (waist circumference or caliper) should be used instead. Wt Readings from Last 10 Encounters:  11/11/22 150 lb 6.4 oz (68.2 kg)  12/16/21 151 lb 9.6 oz (68.8 kg)  02/11/21 154 lb 3.2 oz (69.9 kg)  11/27/20 152 lb 9.6 oz (69.2 kg)   11/20/19 156 lb 6.4 oz (70.9 kg)  03/30/19 157 lb 9.6 oz (71.5 kg)  11/18/18 157 lb 12.8 oz (71.6 kg)  07/08/18 152 lb 12.8 oz (69.3 kg)  11/17/17 156 lb 12.8 oz (71.1 kg)  02/24/17 157 lb 12.8 oz (71.6 kg)   Vital signs reviewed.  Nursing notes reviewed. Weight trend reviewed. General Appearance:  Well developed, well nourished male in no acute distress.   Normal work of breathing at rest Musculoskeletal: All extremities are intact.  Neurological:  Awake, alert,  No obvious focal neurological deficits or cognitive impairments Psychiatric:  Appropriate mood, pleasant demeanor Problem-specific findings:     Results Reviewed: No results found for any visits on 11/11/22.  No results found for this or any previous visit (from the past 2160 hour(s)).        Signed: Loralee Pacas, MD 11/11/2022 1:00 PM

## 2022-11-11 NOTE — Patient Instructions (Signed)
  I believe you have acute laryngitis which is inflammation of the vocal cords causing your hoarseness cough and chest congestion.  I believe that it would go away on its own in about 7 to 10 days and if it does not you need to follow-up for direct visualization with an ear nose and throat physician to make sure it is nothing else.  I believe that it might clear up quicker if we took antibiotics but in general we do not recommend them due to they have risks and it clears up on its own usually in just 7 days or less.  I believe that this is most likely viral but it is possibly that there is some allergic inflammation of the vocal cords perhaps to the recent cheek surgery medication there could be an allergy to that and so taking Benadryl and Claritin might help but.  I do think it could be infectious though-please keep treating it as though you are infectious even though there is a more than a small chance that it is just a reaction to the medication from the biopsies  I recommend that while you are resting that you do not do anything that uses your voice or make to project her voice loudly that you stay hydrated make sure plenty of fluid washes a down your mouth and try steam therapy like hot showers to improve the hoarseness

## 2022-11-30 ENCOUNTER — Telehealth: Payer: Self-pay | Admitting: Family Medicine

## 2022-11-30 ENCOUNTER — Ambulatory Visit: Payer: Medicare Other | Admitting: Podiatry

## 2022-11-30 ENCOUNTER — Encounter: Payer: Self-pay | Admitting: Podiatry

## 2022-11-30 VITALS — BP 113/70

## 2022-11-30 DIAGNOSIS — M79675 Pain in left toe(s): Secondary | ICD-10-CM

## 2022-11-30 DIAGNOSIS — B351 Tinea unguium: Secondary | ICD-10-CM

## 2022-11-30 DIAGNOSIS — M79674 Pain in right toe(s): Secondary | ICD-10-CM

## 2022-11-30 NOTE — Progress Notes (Signed)
  Subjective:  Patient ID: Chad Fisher, male    DOB: 1932/10/22,  MRN: [id number]  Chad Fisher presents to clinic today for painful thick toenails that are difficult to trim. Pain interferes with ambulation. Aggravating factors include wearing enclosed shoe gear. Pain is relieved with periodic professional debridement.  Chief Complaint  Patient presents with   Nail Problem    [facility] PCP-Fisher, Chad PCP VST-12/2021   New problem(s): None.   PCP is Marin Olp, MD.  No Known Allergies  Review of Systems: Negative except as noted in the HPI.  Objective: No changes noted in today's physical examination. Vitals:   11/30/22 1441  BP: 113/70   Chad Fisher is a pleasant 89 y.o. male WD, WN in NAD. AAO x 3.  Vascular Examination:  CFT immediate b/l LE. Palpable DP/PT pulses b/l LE. Digital hair sparse b/l. Skin temperature gradient WNL b/l. No pain with calf compression b/l. No edema noted b/l. No cyanosis or clubbing noted b/l LE.  Dermatological Examination: Pedal skin is warm and supple b/l LE. No open wounds b/l LE. No interdigital macerations noted b/l LE. Toenails 1-5 b/l elongated, discolored, dystrophic, thickened, crumbly with subungual debris and tenderness to dorsal palpation.  Musculoskeletal: Muscle strength 5/5 to all lower extremity muscle groups bilaterally. No pain, crepitus or joint limitation noted with ROM bilateral LE. HAV with bunion deformity noted b/l LE. Patient ambulates independent of any assistive aids.  Neurological: Protective sensation intact 5/5 intact bilaterally with 10g monofilament b/l. Vibratory sensation intact b/l.  Assessment/Plan: 1. Pain due to onychomycosis of toenails of both feet   -Consent given for treatment as described below: -Examined patient. -Patient to continue soft, supportive shoe gear daily. -Mycotic toenails 1-5 bilaterally were debrided in length and girth with sterile nail nippers and dremel without  incident. -Patient/POA to call should there be question/concern in the interim.   Return in about 9 weeks (around 02/01/2023).  Marzetta Board, DPM

## 2022-11-30 NOTE — Telephone Encounter (Signed)
  LAST APPOINTMENT DATE:  11/11/22  NEXT APPOINTMENT DATE: 12/18/22  MEDICATION:cyanocobalamin (,VITAMIN B-12,) 1000 MCG/ML injection   Is the patient out of medication? Yes  PHARMACY:[facility] ([place]) [place], [place] [facility] [address] [place] [address], [place] [address] Phone: [phone]  Fax: [phone]

## 2022-12-01 MED ORDER — CYANOCOBALAMIN 1000 MCG/ML IJ SOLN: INTRAMUSCULAR | 3 refills | Status: DC

## 2022-12-01 NOTE — Telephone Encounter (Signed)
Refill sent to pharmacy.   

## 2022-12-18 ENCOUNTER — Encounter: Payer: Self-pay | Admitting: Family Medicine

## 2022-12-18 ENCOUNTER — Ambulatory Visit (INDEPENDENT_AMBULATORY_CARE_PROVIDER_SITE_OTHER): Payer: Medicare Other | Admitting: Family Medicine

## 2022-12-18 ENCOUNTER — Other Ambulatory Visit: Payer: Self-pay

## 2022-12-18 VITALS — BP 122/70 | HR 70 | Temp 98.0°F | Ht 68.0 in | Wt 150.2 lb

## 2022-12-18 DIAGNOSIS — Z0000 Encounter for general adult medical examination without abnormal findings: Secondary | ICD-10-CM | POA: Diagnosis not present

## 2022-12-18 DIAGNOSIS — E785 Hyperlipidemia, unspecified: Secondary | ICD-10-CM

## 2022-12-18 DIAGNOSIS — N183 Chronic kidney disease, stage 3 unspecified: Secondary | ICD-10-CM | POA: Diagnosis not present

## 2022-12-18 DIAGNOSIS — E039 Hypothyroidism, unspecified: Secondary | ICD-10-CM | POA: Diagnosis not present

## 2022-12-18 DIAGNOSIS — D692 Other nonthrombocytopenic purpura: Secondary | ICD-10-CM | POA: Diagnosis not present

## 2022-12-18 DIAGNOSIS — D649 Anemia, unspecified: Secondary | ICD-10-CM

## 2022-12-18 DIAGNOSIS — D510 Vitamin B12 deficiency anemia due to intrinsic factor deficiency: Secondary | ICD-10-CM

## 2022-12-18 LAB — COMPREHENSIVE METABOLIC PANEL
ALT: 11 U/L (ref 0–53)
AST: 16 U/L (ref 0–37)
Albumin: 3.5 g/dL (ref 3.5–5.2)
Alkaline Phosphatase: 86 U/L (ref 39–117)
BUN: 20 mg/dL (ref 6–23)
CO2: 29 mEq/L (ref 19–32)
Calcium: 8.8 mg/dL (ref 8.4–10.5)
Chloride: 107 mEq/L (ref 96–112)
Creatinine, Ser: 1.15 mg/dL (ref 0.40–1.50)
GFR: 57.22 mL/min — ABNORMAL LOW (ref >60.00)
Glucose, Bld: 94 mg/dL (ref 70–99)
Potassium: 4.5 mEq/L (ref 3.5–5.1)
Sodium: 142 mEq/L (ref 135–145)
Total Bilirubin: 0.7 mg/dL (ref 0.2–1.2)
Total Protein: 6.3 g/dL (ref 6.0–8.3)

## 2022-12-18 LAB — LIPID PANEL
Cholesterol: 128 mg/dL (ref 0–200)
HDL: 43.9 mg/dL (ref >39.00)
LDL Cholesterol: 71 mg/dL (ref 0–99)
NonHDL: 84.28
Total CHOL/HDL Ratio: 3
Triglycerides: 68 mg/dL (ref 0.0–149.0)
VLDL: 13.6 mg/dL (ref 0.0–40.0)

## 2022-12-18 LAB — CBC WITH DIFFERENTIAL/PLATELET
Basophils Absolute: 0 10*3/uL (ref 0.0–0.1)
Basophils Relative: 0.7 % (ref 0.0–3.0)
Eosinophils Absolute: 0.4 10*3/uL (ref 0.0–0.7)
Eosinophils Relative: 6.3 % — ABNORMAL HIGH (ref 0.0–5.0)
HCT: 35.8 % — ABNORMAL LOW (ref 39.0–52.0)
Hemoglobin: 11.7 g/dL — ABNORMAL LOW (ref 13.0–17.0)
Lymphocytes Relative: 15.8 % (ref 12.0–46.0)
Lymphs Abs: 1.1 10*3/uL (ref 0.7–4.0)
MCHC: 32.8 g/dL (ref 30.0–36.0)
MCV: 85.3 fl (ref 78.0–100.0)
Monocytes Absolute: 0.8 10*3/uL (ref 0.1–1.0)
Monocytes Relative: 11.7 % (ref 3.0–12.0)
Neutro Abs: 4.4 10*3/uL (ref 1.4–7.7)
Neutrophils Relative %: 65.5 % (ref 43.0–77.0)
Platelets: 201 10*3/uL (ref 150.0–400.0)
RBC: 4.2 Mil/uL — ABNORMAL LOW (ref 4.22–5.81)
RDW: 14 % (ref 11.5–15.5)
WBC: 6.7 10*3/uL (ref 4.0–10.5)

## 2022-12-18 LAB — VITAMIN B12: Vitamin B-12: 1285 pg/mL — ABNORMAL HIGH (ref 211–911)

## 2022-12-18 LAB — TSH: TSH: 1.33 u[IU]/mL (ref 0.35–5.50)

## 2022-12-18 NOTE — Patient Instructions (Addendum)
You are eligible to schedule your annual wellness visit with our nurse specialist Otila Kluver.  Please consider scheduling this before you leave today  Please stop by lab before you go If you have mychart- we will send your results within 3 business days of [place] receiving them.  If you do not have mychart- we will call you about results within 5 business days of [place] receiving them.  *please also note that you will see labs on mychart as soon as they post. I will later go in and write notes on them- will say "notes from Dr. Yong Channel"   Recommended follow up: Return in about 1 year (around 12/18/2023) for physical or sooner if needed.Schedule b4 you leave.

## 2022-12-18 NOTE — Progress Notes (Signed)
Phone: [phone]   Subjective:  Patient presents today for their annual physical. Chief complaint-noted.   See problem oriented charting- ROS- full  review of systems was completed and negative  Per full ROS sheet completed by patient  The following were reviewed and entered/updated in epic: Past Medical History:  Diagnosis Date   Cancer [facility])    skin   Hernia 1987   right inguinal    Patient Active Problem List   Diagnosis Date Noted   CKD (chronic kidney disease), stage III ([place]) 11/20/2019    Priority: Medium    History of kidney stones 02/03/2016    Priority: Medium    Hypothyroidism 08/02/2013    Priority: Medium    Pernicious anemia 08/02/2013    Priority: Medium    Hyperlipidemia 12/20/2007    Priority: Medium    Personal history of urinary calculi 11/11/2022    Priority: Low   Senile purpura ([place]) 11/17/2017    Priority: Low   SKIN CANCER, HX OF 05/04/2007    Priority: Low   Past Surgical History:  Procedure Laterality Date   CATARACT EXTRACTION, BILATERAL     HERNIA REPAIR     MOHS SURGERY     left ear    Family History  Problem Relation Age of Onset   Anemia Father    Benign prostatic hyperplasia Brother    Cancer Daughter    Breast cancer Daughter    Kidney cancer Son        doing ok    Medications- reviewed and updated Current Outpatient Medications  Medication Sig Dispense Refill   cyanocobalamin (VITAMIN B12) 1000 MCG/ML injection INJECT 68m INTRAMUSCULARLY EVERY 30 DAYS 10 mL 3   levothyroxine (SYNTHROID) 75 MCG tablet TAKE 1 TABLET BY MOUTH DAILY 100 tablet 2   Multiple Vitamins-Minerals (ICAPS AREDS 2 PO) Take by mouth.     Potassium Citrate 15 MEQ (1620 MG) TBCR Take 1 tablet by mouth every 12 (twelve) hours.     simvastatin (ZOCOR) 20 MG tablet TAKE 1 TABLET BY MOUTH DAILY 100 tablet 2   No current facility-administered medications for this visit.    Allergies-reviewed and updated No Known Allergies  Social History    Social History Narrative   Married. 1 living son (daughter died of cancer). 1 grandchild. 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 3x a week mows with self propelled.       Hobbies: a lot of time at church- building ramps etc, enjoys sports- football, basketball, baseball.    Objective  Objective:  BP 122/70   Pulse 70   Temp 98 F (36.7 C)   Ht '5\' 8"'$  (1.727 m)   Wt 150 lb 3.2 oz (68.1 kg)   SpO2 95%   BMI 22.84 kg/m  Gen: NAD, resting comfortably HEENT: Mucous membranes are moist. Oropharynx normal Neck: no thyromegaly CV: RRR no murmurs rubs or gallops Lungs: CTAB no crackles, wheeze, rhonchi Abdomen: soft/nontender/nondistended/normal bowel sounds. No rebound or guarding.  Ext: no edema Skin: warm, dry Neuro: grossly normal, moves all extremities, PERRLA   Assessment and Plan  89y.o. male presenting for annual physical.  Health Maintenance counseling: 1. Anticipatory guidance: Patient counseled regarding regular dental exams -q6 months, eye exams -yearly,  avoiding smoking and second hand smoke, limiting alcohol to 2 beverages per  day- doesn't drink, no illicit drugs .   2. Risk factor reduction:  Advised patient of need for regular exercise and diet rich and fruits and vegetables to reduce risk of heart attack and stroke.  Exercise- still doing his own yardwork and remains very active.  Diet/weight management-healthy weight- reasonably healthy diet.  Wt Readings from Last 3 Encounters:  12/18/22 150 lb 3.2 oz (68.1 kg)  11/11/22 150 lb 6.4 oz (68.2 kg)  12/16/21 151 lb 9.6 oz (68.8 kg)  3. Immunizations/screenings/ancillary studies- up to date  Immunization History  Administered Date(s) Administered   Covid-19, Mrna,Vaccine(Spikevax)52yr and older 07/10/2022   Fluad Quad(high Dose 65+) 06/22/2019,  06/12/2020   Influenza Split 07/04/2014   Influenza Whole 10/12/2005, 07/26/2007, 07/12/2008, 07/15/2009, 07/29/2010, 07/12/2013   Influenza, High Dose Seasonal PF 07/02/2018, 06/13/2019, 07/26/2021, 07/08/2022   Influenza-Unspecified 08/12/2000, 09/11/2001, 09/11/2002, 07/13/2015, 07/24/2016, 07/13/2017, 07/02/2018   PFIZER(Purple Top)SARS-COV-2 Vaccination 10/31/2019, 11/21/2019, 07/09/2020, 04/15/2021   Pfizer Covid-19 Vaccine Bivalent Booster 12yr& up 08/08/2021   Pneumococcal Conjugate-13 08/09/2014   Pneumococcal Polysaccharide-23 10/12/2002, 07/15/2009   Pneumococcal-Unspecified 11/01/2001   Td 10/12/2002   Tetanus 08/02/2013   Zoster Recombinat (Shingrix) 05/09/2020, 07/26/2020   Zoster, Live 08/19/2007  4. Prostate cancer screening- past age based screening recommendations   5. Colon cancer screening -  no blood in stool. Colonoscopy 2008 and was told no repeat due to age per his report.   6. Skin cancer screening-follows with Dr. JoRonnald Rampvery 6 months due to history of skin cancer- recent Mohs.   advised regular sunscreen use. Denies worrisome, changing, or new skin lesions.  7. Smoking associated screening (lung cancer screening, AAA screen 65-75, UA)- never smoker 8. STD screening -  only active with wife- not needed  Status of chronic or acute concerns   #Hyperlipidemia S: Medication: Simvastatin 20 mg daily. Piror asa but worsened senile purpura Lab Results  Component Value Date   CHOL 147 12/16/2021   HDL 50.60 12/16/2021   LDLCALC 75 12/16/2021   LDLDIRECT 187.8 12/02/2006   TRIG 105.0 12/16/2021   CHOLHDL 3 12/16/2021  A/P: close to ideal goal- unlikely to change dose further at his age- continue current medications    # Hypothyroidism S:medication: levothyroxine 75 mcg   Lab Results  Component Value Date   TSH 1.06 12/16/2021  A/P: hopefully stable- update tsh today. Continue current meds for now    #Pernicious anemia/B12 deficiency S: Patient self injects  on a monthly basis-unfortunately he has to pay the full cost as his insurance does not cover A/P: hopefully stable- update b12 today. Continue current meds for now   #Chronic kidney disease stage III S: GFR is typically in the 50s range -Patient knows to avoid NSAIDs  A/P: hopefully stable- update cmp today.    #history of kidney stones- follows with urology for potassium citrate   #senile purpura- noted- arms are worst. Stable. Check cbc at least annually  Recommended follow up: Return in about 1 year (around 12/18/2023) for physical or sooner if needed.Schedule b4 you leave. Future Appointments  Date Time Provider [place]4/22/2024  2:30 PM [facility] [facility]  04/06/2023  3:00 PM GaMarzetta BoardDPM [facility]   Lab/Order associations: fasting   ICD-10-CM   1. Preventative health care  Z00.00     2. Hyperlipidemia, unspecified hyperlipidemia type  E78.5 CBC with Differential/Platelet    Comprehensive metabolic panel    Lipid panel    3. Hypothyroidism, unspecified type  E03.9 TSH    4. Pernicious anemia  D51.0 Vitamin B12    5. Senile purpura (HCC) Chronic D69.2     6. Stage 3 chronic kidney disease, unspecified whether stage 3a or 3b CKD ([place]) Chronic N18.30      No orders of the defined types were placed in this encounter.  Return precautions advised.  Garret Reddish, MD

## 2023-01-13 IMAGING — DX DG CERVICAL SPINE COMPLETE 4+V
6 series · 6 of 6 positions shown · non-contrast
Comparison: No prior.

CLINICAL DATA: Left arm pain.

EXAM:
CERVICAL SPINE - COMPLETE 4+ VIEW

[cervical spine lat (1 of 2)]
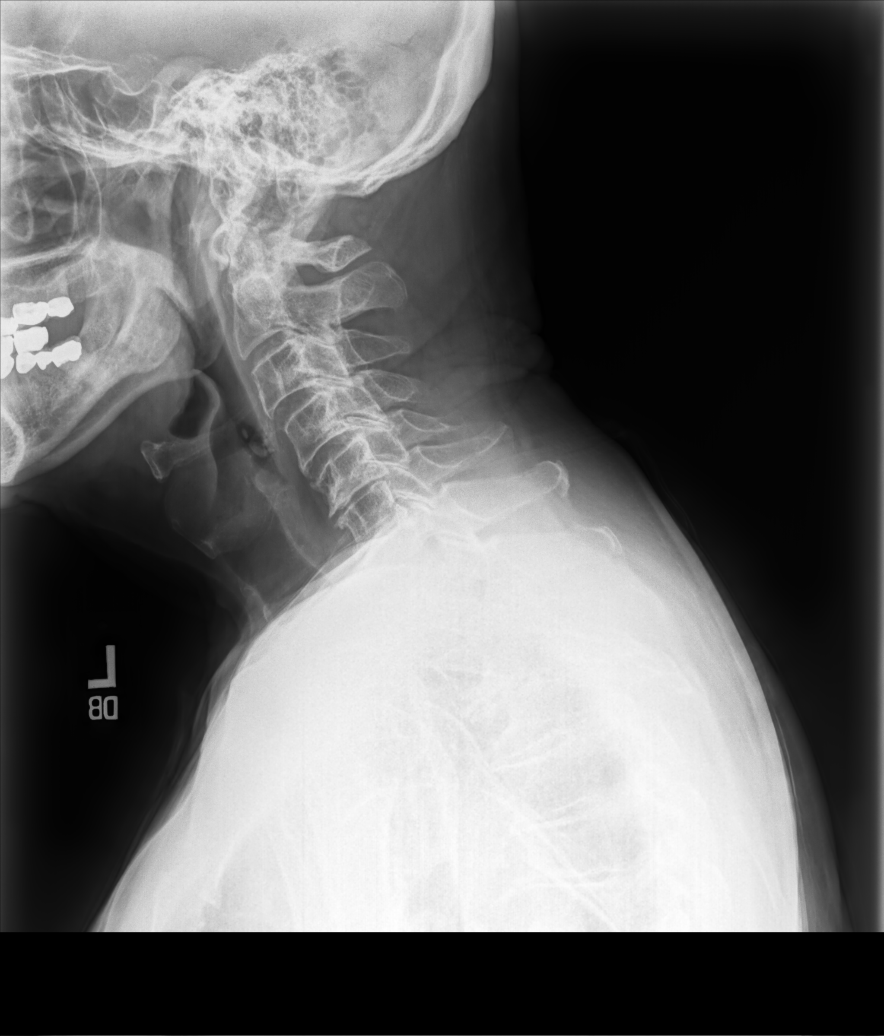

[cervical spine oblique (1 of 2)]
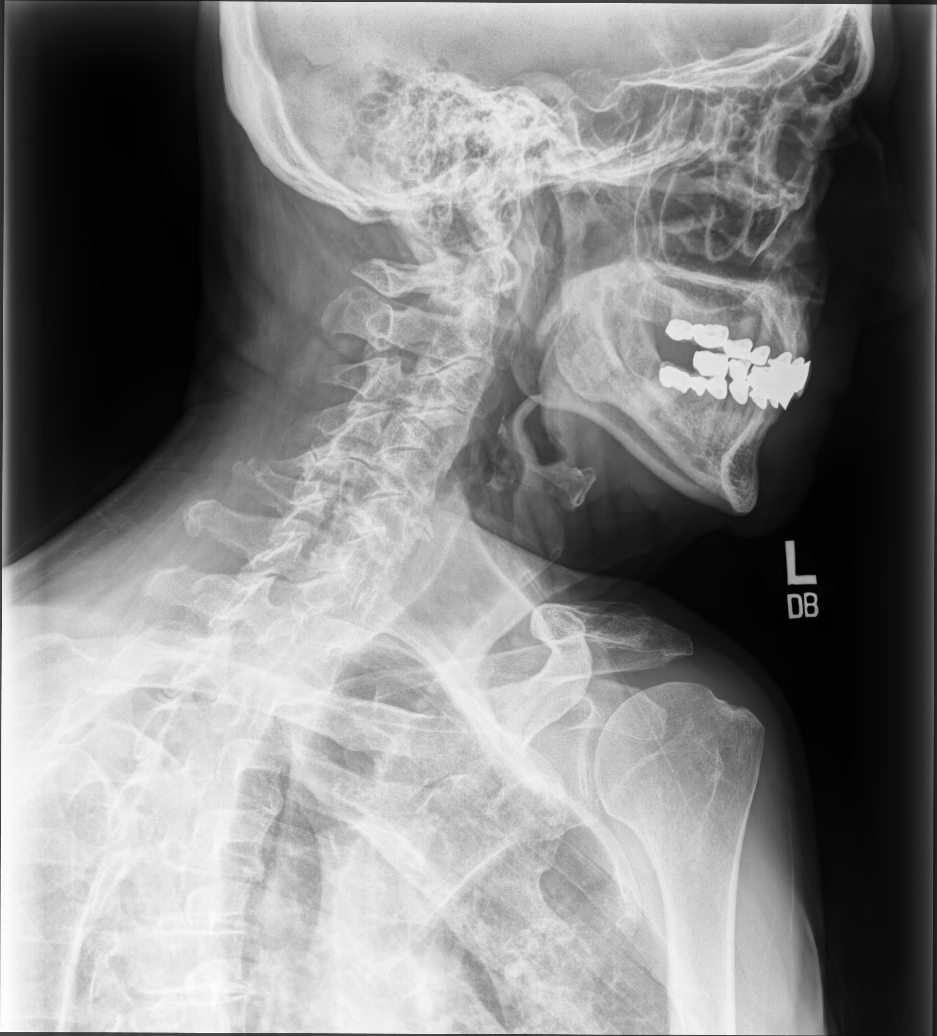

[cervical spine oblique (2 of 2)]
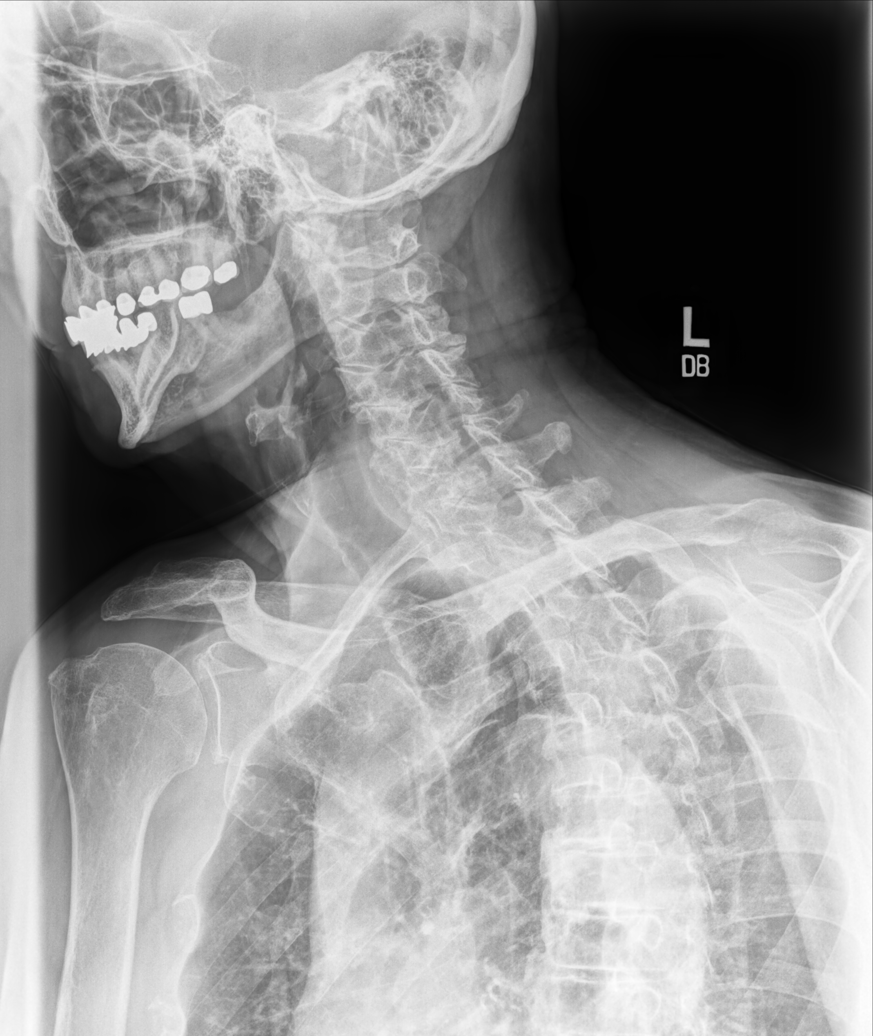

[cervical spine ap (1 of 2)]
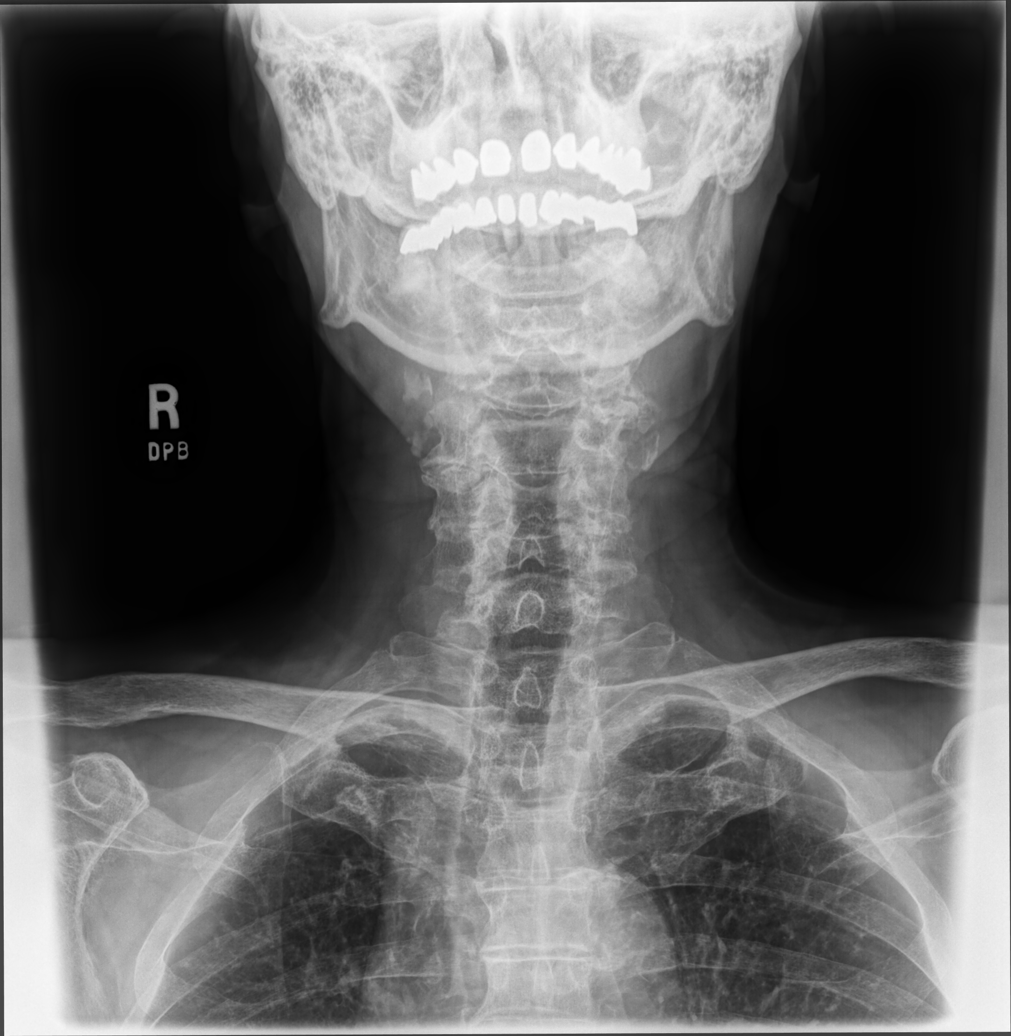

[cervical spine ap (2 of 2)]
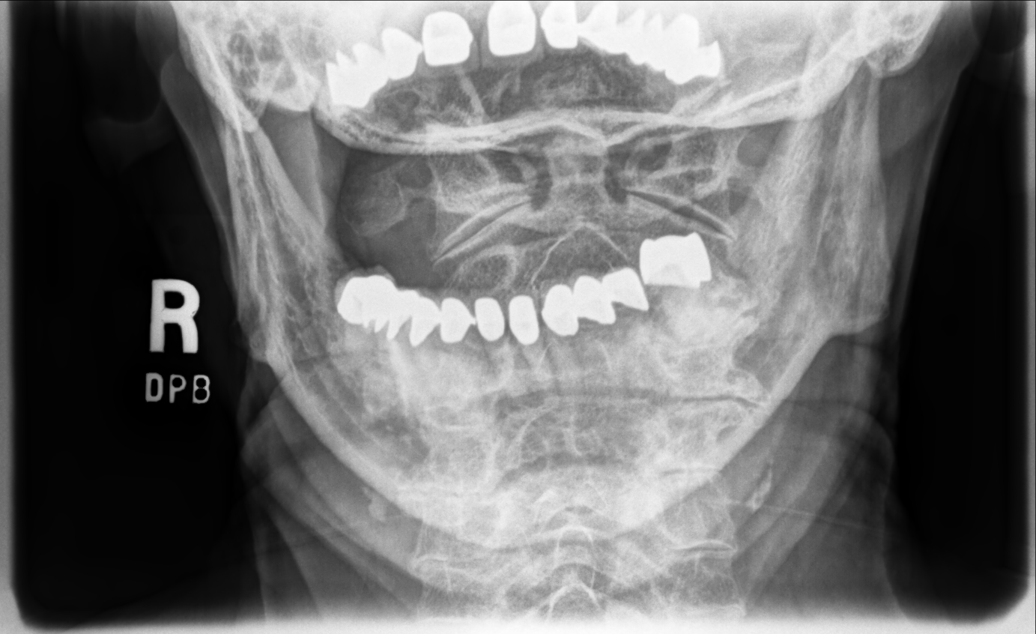

[cervical spine lat (2 of 2)]
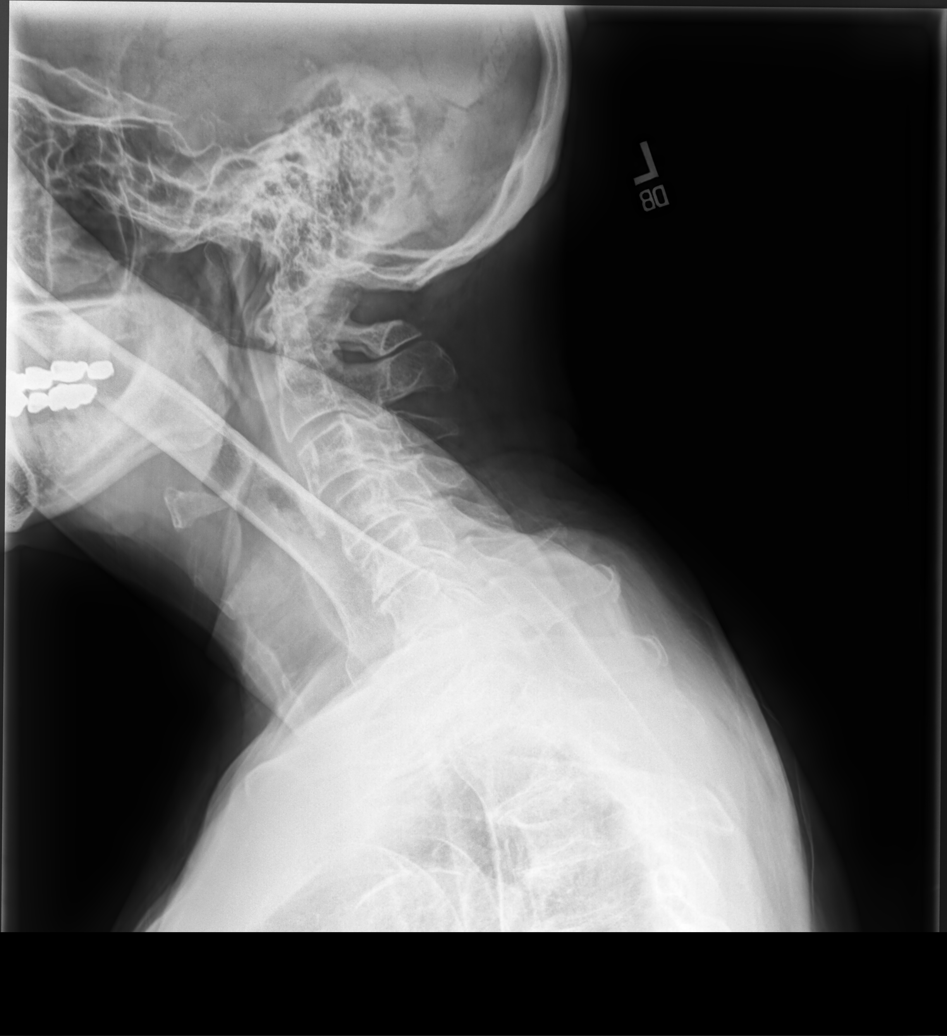

[6 of 6 positions shown; findings below may reference images not displayed]

FINDINGS: C7 poorly visualized. Diffuse multilevel degenerative change with
multilevel disc degeneration and endplate osteophyte formation.
Degenerative changes most prominent at C5-C6 and C6-C7. Multilevel
bilateral neural foraminal narrowing. No acute bony abnormality.
Carotid vascular calcification. Biapical pleural thickening
consistent scarring.
IMPRESSION: 1. C7 poorly visualized. Diffuse degenerative change cervical spine.
Degenerative changes most prominent at C5-C6 and C6-C7. Multilevel
bilateral neural foraminal narrowing. No acute bony abnormality.

2. Carotid vascular disease.

## 2023-01-19 ENCOUNTER — Other Ambulatory Visit (INDEPENDENT_AMBULATORY_CARE_PROVIDER_SITE_OTHER): Payer: Medicare Other

## 2023-01-19 DIAGNOSIS — D649 Anemia, unspecified: Secondary | ICD-10-CM

## 2023-01-19 LAB — CBC WITH DIFFERENTIAL/PLATELET
Basophils Absolute: 0 10*3/uL (ref 0.0–0.1)
Basophils Relative: 0.7 % (ref 0.0–3.0)
Eosinophils Absolute: 0.5 10*3/uL (ref 0.0–0.7)
Eosinophils Relative: 7.3 % — ABNORMAL HIGH (ref 0.0–5.0)
HCT: 34.8 % — ABNORMAL LOW (ref 39.0–52.0)
Hemoglobin: 11.3 g/dL — ABNORMAL LOW (ref 13.0–17.0)
Lymphocytes Relative: 22.6 % (ref 12.0–46.0)
Lymphs Abs: 1.5 10*3/uL (ref 0.7–4.0)
MCHC: 32.5 g/dL (ref 30.0–36.0)
MCV: 83.4 fl (ref 78.0–100.0)
Monocytes Absolute: 0.7 10*3/uL (ref 0.1–1.0)
Monocytes Relative: 10.6 % (ref 3.0–12.0)
Neutro Abs: 3.9 10*3/uL (ref 1.4–7.7)
Neutrophils Relative %: 58.8 % (ref 43.0–77.0)
Platelets: 203 10*3/uL (ref 150.0–400.0)
RBC: 4.17 Mil/uL — ABNORMAL LOW (ref 4.22–5.81)
RDW: 14.6 % (ref 11.5–15.5)
WBC: 6.7 10*3/uL (ref 4.0–10.5)

## 2023-01-20 ENCOUNTER — Other Ambulatory Visit: Payer: Self-pay

## 2023-01-20 DIAGNOSIS — D649 Anemia, unspecified: Secondary | ICD-10-CM

## 2023-01-21 ENCOUNTER — Telehealth: Payer: Self-pay

## 2023-01-21 ENCOUNTER — Other Ambulatory Visit (INDEPENDENT_AMBULATORY_CARE_PROVIDER_SITE_OTHER): Payer: Medicare Other

## 2023-01-21 DIAGNOSIS — K921 Melena: Secondary | ICD-10-CM

## 2023-01-21 DIAGNOSIS — D649 Anemia, unspecified: Secondary | ICD-10-CM

## 2023-01-21 LAB — FECAL OCCULT BLOOD, IMMUNOCHEMICAL: Fecal Occult Bld: POSITIVE — AB

## 2023-01-21 NOTE — Telephone Encounter (Signed)
Please refer to gastroenterology under anemia unspecified and blood in the stool-I realize he is 74 and they may want to avoid diagnostic study but I would like their opinion

## 2023-01-21 NOTE — Addendum Note (Signed)
Addended by: Gwenette Greet on: 01/21/2023 03:20 PM   Modules accepted: Orders

## 2023-01-21 NOTE — Telephone Encounter (Signed)
Lori from [facility] lab stated that the pt's Ifob came back positive.  Thank you,  Christy Gentles

## 2023-01-25 ENCOUNTER — Telehealth: Payer: Self-pay | Admitting: Family Medicine

## 2023-01-25 NOTE — Telephone Encounter (Signed)
Patient requests sooner appointment than 02/02/23-states per [facility]

## 2023-02-01 ENCOUNTER — Ambulatory Visit: Payer: Medicare Other | Admitting: Podiatry

## 2023-02-01 ENCOUNTER — Encounter: Payer: Self-pay | Admitting: Podiatry

## 2023-02-01 VITALS — BP 100/61

## 2023-02-01 DIAGNOSIS — B351 Tinea unguium: Secondary | ICD-10-CM | POA: Diagnosis not present

## 2023-02-01 DIAGNOSIS — M79674 Pain in right toe(s): Secondary | ICD-10-CM

## 2023-02-01 DIAGNOSIS — M79675 Pain in left toe(s): Secondary | ICD-10-CM | POA: Diagnosis not present

## 2023-02-02 ENCOUNTER — Ambulatory Visit: Payer: Medicare Other | Admitting: Family Medicine

## 2023-02-03 ENCOUNTER — Encounter: Payer: Self-pay | Admitting: Physician Assistant

## 2023-02-04 NOTE — Progress Notes (Signed)
  Subjective:  Patient ID: Chad Fisher, male    DOB: 1933/05/19,  MRN: [id number]  Chad Fisher presents to clinic today for painful elongated mycotic toenails 1-5 bilaterally which are tender when wearing enclosed shoe gear. Pain is relieved with periodic professional debridement.  Chief Complaint  Patient presents with   Nail Problem    [facility] PCP-[facility] PCP VST-Early 2024   New problem(s): None.   PCP is Shelva Majestic, MD.  No Known Allergies  Review of Systems: Negative except as noted in the HPI.  Objective: No changes noted in today's physical examination. Vitals:   02/01/23 1517  BP: 100/61   Chad Fisher is a pleasant 89 y.o. male WD, WN in NAD. AAO x 3.  Vascular Examination: Capillary refill time immediate b/l. Vascular status intact b/l with palpable pedal pulses. Pedal hair present b/l. No edema. No pain with calf compression b/l. Skin temperature gradient WNL b/l.   Neurological Examination: Sensation grossly intact b/l with 10 gram monofilament. Vibratory sensation intact b/l.   Dermatological Examination: Pedal skin with normal turgor, texture and tone b/l.  No open wounds. No interdigital macerations.   Toenails 1-5 b/l thick, discolored, elongated with subungual debris and pain on dorsal palpation.   No hyperkeratotic nor porokeratotic lesions present on today's visit.  Musculoskeletal Examination: Normal muscle strength 5/5 to all lower extremity muscle groups bilaterally. HAV with bunion deformity noted b/l LE.Marland Kitchen No pain, crepitus or joint limitation noted with ROM b/l LE.  Patient ambulates independently without assistive aids.  Radiographs: None  Assessment/Plan: 1. Pain due to onychomycosis of toenails of both feet   -Patient was evaluated and treated. All patient's and/or POA's questions/concerns answered on today's visit. -Continue supportive shoe gear daily. -Toenails 1-5 b/l were debrided in length and girth with sterile nail nippers and  dremel without iatrogenic bleeding.  -Patient/POA to call should there be question/concern in the interim.   Return in about 9 weeks (around 04/05/2023).  Freddie Breech, DPM

## 2023-02-17 ENCOUNTER — Other Ambulatory Visit (INDEPENDENT_AMBULATORY_CARE_PROVIDER_SITE_OTHER): Payer: Medicare Other

## 2023-02-17 DIAGNOSIS — D649 Anemia, unspecified: Secondary | ICD-10-CM

## 2023-02-17 LAB — CBC WITH DIFFERENTIAL/PLATELET
Basophils Absolute: 0.1 10*3/uL (ref 0.0–0.1)
Basophils Relative: 1 % (ref 0.0–3.0)
Eosinophils Absolute: 0.4 10*3/uL (ref 0.0–0.7)
Eosinophils Relative: 6.2 % — ABNORMAL HIGH (ref 0.0–5.0)
HCT: 33.8 % — ABNORMAL LOW (ref 39.0–52.0)
Hemoglobin: 11.3 g/dL — ABNORMAL LOW (ref 13.0–17.0)
Lymphocytes Relative: 24.2 % (ref 12.0–46.0)
Lymphs Abs: 1.5 10*3/uL (ref 0.7–4.0)
MCHC: 33.4 g/dL (ref 30.0–36.0)
MCV: 83 fl (ref 78.0–100.0)
Monocytes Absolute: 0.7 10*3/uL (ref 0.1–1.0)
Monocytes Relative: 12.2 % — ABNORMAL HIGH (ref 3.0–12.0)
Neutro Abs: 3.4 10*3/uL (ref 1.4–7.7)
Neutrophils Relative %: 56.4 % (ref 43.0–77.0)
Platelets: 201 10*3/uL (ref 150.0–400.0)
RBC: 4.07 Mil/uL — ABNORMAL LOW (ref 4.22–5.81)
RDW: 15.1 % (ref 11.5–15.5)
WBC: 6 10*3/uL (ref 4.0–10.5)

## 2023-02-19 ENCOUNTER — Telehealth: Payer: Self-pay | Admitting: *Deleted

## 2023-02-19 ENCOUNTER — Other Ambulatory Visit: Payer: Medicare Other

## 2023-02-19 ENCOUNTER — Telehealth: Payer: Self-pay

## 2023-02-19 ENCOUNTER — Ambulatory Visit (INDEPENDENT_AMBULATORY_CARE_PROVIDER_SITE_OTHER): Payer: Medicare Other

## 2023-02-19 DIAGNOSIS — D649 Anemia, unspecified: Secondary | ICD-10-CM | POA: Diagnosis not present

## 2023-02-19 LAB — FECAL OCCULT BLOOD, IMMUNOCHEMICAL: Fecal Occult Bld: POSITIVE — AB

## 2023-02-19 NOTE — Telephone Encounter (Signed)
[facility] lab called to give critical lab, positive IFOB test.

## 2023-02-19 NOTE — Telephone Encounter (Signed)
Entered in error lab called wrong office with results.

## 2023-02-19 NOTE — Telephone Encounter (Deleted)
Positive Ifob   Caller: Loma Newton  Receiver: Rosita Kea, RMA  Date and time:  02/19/2023 @ 3:55 pm

## 2023-02-21 NOTE — Telephone Encounter (Signed)
With anemia and positive ifob- team please refer to gastroenterology under anemia unspecified- typicallly want to avoid procedures at his age but I would like their opinion - give him # to call in about a week after referral placed

## 2023-02-24 DIAGNOSIS — N281 Cyst of kidney, acquired: Secondary | ICD-10-CM | POA: Diagnosis not present

## 2023-02-24 DIAGNOSIS — N200 Calculus of kidney: Secondary | ICD-10-CM | POA: Diagnosis not present

## 2023-03-01 ENCOUNTER — Ambulatory Visit: Payer: Self-pay

## 2023-03-01 NOTE — Patient Instructions (Signed)
Visit Information  Thank you for taking time to visit with me today. Please don't hesitate to contact me if I can be of assistance to you.   Following are the goals we discussed today:  -Contact your primary care providers office as needed   If you are experiencing a Mental Health or Behavioral Health Crisis or need someone to talk to, please go to [facility] [address] [place] [phone]) call [phone]  The patient verbalized understanding of instructions, educational materials, and care plan provided today and DECLINED offer to receive copy of patient instructions, educational materials, and care plan.   No further follow up required: Please contact the care coordination team as needed  Bevelyn Ngo, Kenard Gower, CDP Social Worker, Certified Dementia Practitioner [facility] Care Management  Care Coordination [phone]

## 2023-03-01 NOTE — Patient Outreach (Signed)
  Care Coordination   Initial Visit Note   03/01/2023 Name: Chad Fisher MRN: [id number] DOB: [birth year]  Chad Fisher is a 89 y.o. year old male who sees Durene Cal, Aldine Contes, MD for primary care. I spoke with  [name] by phone today.  What matters to the patients health and wellness today?  No concerns, patient reports he is doing well at this time    Goals Addressed             This Visit's Progress    COMPLETED: Care Coordination Activities       Care Coordination Interventions: [facility] screening reviewed - no acute resource challenges identified at this time Determined the patient does not have difficulty affording medication at this time Education provided on the role of the care coordination team - no follow up desired at this time; patient is aware he may engage with the care coordination team as needed in the future Encouraged the patient to contact his primary care provider as needed         SDOH assessments and interventions completed:  Yes  SDOH Interventions Today    Flowsheet Row Most Recent Value  SDOH Interventions   Food Insecurity Interventions Intervention Not Indicated  Housing Interventions Intervention Not Indicated  Transportation Interventions Intervention Not Indicated        Care Coordination Interventions:  Yes, provided   Interventions Today    Flowsheet Row Most Recent Value  Chronic Disease   Chronic disease during today's visit Chronic Kidney Disease/End Stage Renal Disease (ESRD)  [CKD III]  General Interventions   General Interventions Discussed/Reviewed General Interventions Discussed, Doctor Visits  Doctor Visits Discussed/Reviewed Doctor Visits Reviewed  Education Interventions   Education Provided Provided Education  Provided Verbal Education On Other  [role of care coordination team]       Follow up plan: No further intervention required.   Encounter Outcome:  Pt. Visit Completed   Bevelyn Ngo, BSW, CDP Social  Worker, Certified Dementia Practitioner [facility] Care Management  Care Coordination [phone]

## 2023-03-27 ENCOUNTER — Other Ambulatory Visit: Payer: Self-pay | Admitting: Urology

## 2023-04-04 ENCOUNTER — Inpatient Hospital Stay (HOSPITAL_COMMUNITY)
Admission: EM | Admit: 2023-04-04 | Discharge: 2023-04-11 | DRG: 330 | Disposition: A | Discharge status: Home / Self Care | Payer: Medicare Other | Attending: Surgery | Admitting: Surgery

## 2023-04-04 ENCOUNTER — Telehealth: Payer: Self-pay | Admitting: Nurse Practitioner

## 2023-04-04 ENCOUNTER — Other Ambulatory Visit: Payer: Self-pay

## 2023-04-04 ENCOUNTER — Encounter (HOSPITAL_COMMUNITY): Payer: Self-pay | Admitting: *Deleted

## 2023-04-04 DIAGNOSIS — E785 Hyperlipidemia, unspecified: Secondary | ICD-10-CM | POA: Diagnosis not present

## 2023-04-04 DIAGNOSIS — C180 Malignant neoplasm of cecum: Secondary | ICD-10-CM | POA: Diagnosis not present

## 2023-04-04 DIAGNOSIS — Z803 Family history of malignant neoplasm of breast: Secondary | ICD-10-CM | POA: Diagnosis not present

## 2023-04-04 DIAGNOSIS — Z6821 Body mass index (BMI) 21.0-21.9, adult: Secondary | ICD-10-CM | POA: Diagnosis not present

## 2023-04-04 DIAGNOSIS — Z87448 Personal history of other diseases of urinary system: Secondary | ICD-10-CM | POA: Diagnosis not present

## 2023-04-04 DIAGNOSIS — D638 Anemia in other chronic diseases classified elsewhere: Secondary | ICD-10-CM | POA: Diagnosis not present

## 2023-04-04 DIAGNOSIS — R918 Other nonspecific abnormal finding of lung field: Secondary | ICD-10-CM | POA: Diagnosis not present

## 2023-04-04 DIAGNOSIS — R54 Age-related physical debility: Secondary | ICD-10-CM | POA: Diagnosis not present

## 2023-04-04 DIAGNOSIS — Z8051 Family history of malignant neoplasm of kidney: Secondary | ICD-10-CM | POA: Diagnosis not present

## 2023-04-04 DIAGNOSIS — E440 Moderate protein-calorie malnutrition: Secondary | ICD-10-CM | POA: Diagnosis not present

## 2023-04-04 DIAGNOSIS — N200 Calculus of kidney: Secondary | ICD-10-CM | POA: Diagnosis not present

## 2023-04-04 DIAGNOSIS — E039 Hypothyroidism, unspecified: Secondary | ICD-10-CM | POA: Diagnosis not present

## 2023-04-04 DIAGNOSIS — K625 Hemorrhage of anus and rectum: Secondary | ICD-10-CM | POA: Diagnosis not present

## 2023-04-04 DIAGNOSIS — D630 Anemia in neoplastic disease: Secondary | ICD-10-CM | POA: Diagnosis present

## 2023-04-04 DIAGNOSIS — Z85828 Personal history of other malignant neoplasm of skin: Secondary | ICD-10-CM | POA: Diagnosis not present

## 2023-04-04 DIAGNOSIS — K635 Polyp of colon: Secondary | ICD-10-CM | POA: Diagnosis not present

## 2023-04-04 DIAGNOSIS — C189 Malignant neoplasm of colon, unspecified: Secondary | ICD-10-CM | POA: Diagnosis present

## 2023-04-04 DIAGNOSIS — I739 Peripheral vascular disease, unspecified: Secondary | ICD-10-CM | POA: Diagnosis not present

## 2023-04-04 DIAGNOSIS — Z79899 Other long term (current) drug therapy: Secondary | ICD-10-CM

## 2023-04-04 DIAGNOSIS — Z8719 Personal history of other diseases of the digestive system: Secondary | ICD-10-CM

## 2023-04-04 DIAGNOSIS — N289 Disorder of kidney and ureter, unspecified: Secondary | ICD-10-CM | POA: Diagnosis not present

## 2023-04-04 DIAGNOSIS — C772 Secondary and unspecified malignant neoplasm of intra-abdominal lymph nodes: Secondary | ICD-10-CM | POA: Diagnosis present

## 2023-04-04 DIAGNOSIS — E782 Mixed hyperlipidemia: Secondary | ICD-10-CM | POA: Diagnosis not present

## 2023-04-04 DIAGNOSIS — K921 Melena: Secondary | ICD-10-CM | POA: Diagnosis present

## 2023-04-04 DIAGNOSIS — R935 Abnormal findings on diagnostic imaging of other abdominal regions, including retroperitoneum: Secondary | ICD-10-CM

## 2023-04-04 DIAGNOSIS — D123 Benign neoplasm of transverse colon: Secondary | ICD-10-CM | POA: Diagnosis present

## 2023-04-04 DIAGNOSIS — D649 Anemia, unspecified: Secondary | ICD-10-CM | POA: Insufficient documentation

## 2023-04-04 DIAGNOSIS — Z832 Family history of diseases of the blood and blood-forming organs and certain disorders involving the immune mechanism: Secondary | ICD-10-CM

## 2023-04-04 DIAGNOSIS — D510 Vitamin B12 deficiency anemia due to intrinsic factor deficiency: Secondary | ICD-10-CM | POA: Diagnosis present

## 2023-04-04 DIAGNOSIS — K6389 Other specified diseases of intestine: Secondary | ICD-10-CM | POA: Diagnosis not present

## 2023-04-04 DIAGNOSIS — Z79890 Hormone replacement therapy (postmenopausal): Secondary | ICD-10-CM | POA: Diagnosis not present

## 2023-04-04 DIAGNOSIS — C182 Malignant neoplasm of ascending colon: Secondary | ICD-10-CM | POA: Diagnosis not present

## 2023-04-04 DIAGNOSIS — R9431 Abnormal electrocardiogram [ECG] [EKG]: Secondary | ICD-10-CM | POA: Diagnosis not present

## 2023-04-04 LAB — CBC WITH DIFFERENTIAL/PLATELET
Abs Immature Granulocytes: 0.01 10*3/uL (ref 0.00–0.07)
Basophils Absolute: 0 10*3/uL (ref 0.0–0.1)
Basophils Relative: 1 %
Eosinophils Absolute: 0.2 10*3/uL (ref 0.0–0.5)
Eosinophils Relative: 3 %
HCT: 35.6 % — ABNORMAL LOW (ref 39.0–52.0)
Hemoglobin: 11 g/dL — ABNORMAL LOW (ref 13.0–17.0)
Immature Granulocytes: 0 %
Lymphocytes Relative: 22 %
Lymphs Abs: 1.3 10*3/uL (ref 0.7–4.0)
MCH: 26.1 pg (ref 26.0–34.0)
MCHC: 30.9 g/dL (ref 30.0–36.0)
MCV: 84.6 fL (ref 80.0–100.0)
Monocytes Absolute: 0.7 10*3/uL (ref 0.1–1.0)
Monocytes Relative: 11 %
Neutro Abs: 3.7 10*3/uL (ref 1.7–7.7)
Neutrophils Relative %: 63 %
Platelets: 194 10*3/uL (ref 150–400)
RBC: 4.21 MIL/uL — ABNORMAL LOW (ref 4.22–5.81)
RDW: 15.1 % (ref 11.5–15.5)
WBC: 5.9 10*3/uL (ref 4.0–10.5)
nRBC: 0 % (ref 0.0–0.2)

## 2023-04-04 LAB — COMPREHENSIVE METABOLIC PANEL
ALT: 23 U/L (ref 0–44)
AST: 25 U/L (ref 15–41)
Albumin: 3.4 g/dL — ABNORMAL LOW (ref 3.5–5.0)
Alkaline Phosphatase: 108 U/L (ref 38–126)
Anion gap: 8 (ref 5–15)
BUN: 19 mg/dL (ref 8–23)
CO2: 25 mmol/L (ref 22–32)
Calcium: 8.5 mg/dL — ABNORMAL LOW (ref 8.9–10.3)
Chloride: 107 mmol/L (ref 98–111)
Creatinine, Ser: 1.13 mg/dL (ref 0.61–1.24)
GFR, Estimated: >60 mL/min (ref >60)
Glucose, Bld: 127 mg/dL — ABNORMAL HIGH (ref 70–99)
Potassium: 4.1 mmol/L (ref 3.5–5.1)
Sodium: 140 mmol/L (ref 135–145)
Total Bilirubin: 0.8 mg/dL (ref 0.3–1.2)
Total Protein: 6.9 g/dL (ref 6.5–8.1)

## 2023-04-04 MED ORDER — ONDANSETRON HCL 4 MG/2ML IJ SOLN: 4.0000 mg | Freq: Four times a day (QID) | INTRAMUSCULAR | Status: DC | PRN

## 2023-04-04 MED ORDER — ACETAMINOPHEN 325 MG PO TABS
650.0000 mg | ORAL_TABLET | Freq: Four times a day (QID) | ORAL | Status: DC | PRN
  Administered 2023-04-07: 650 mg via ORAL
  Filled 2023-04-04: qty 2

## 2023-04-04 MED ORDER — ENOXAPARIN SODIUM 40 MG/0.4ML IJ SOSY: 40.0000 mg | PREFILLED_SYRINGE | Freq: Every day | INTRAMUSCULAR | Status: DC

## 2023-04-04 MED ORDER — ONDANSETRON HCL 4 MG PO TABS: 4.0000 mg | ORAL_TABLET | Freq: Four times a day (QID) | ORAL | Status: DC | PRN

## 2023-04-04 MED ORDER — OXYCODONE HCL 5 MG PO TABS: 5.0000 mg | ORAL_TABLET | ORAL | Status: DC | PRN

## 2023-04-04 MED ORDER — ACETAMINOPHEN 650 MG RE SUPP: 650.0000 mg | Freq: Four times a day (QID) | RECTAL | Status: DC | PRN

## 2023-04-04 MED ORDER — LEVOTHYROXINE SODIUM 75 MCG PO TABS
75.0000 ug | ORAL_TABLET | Freq: Every day | ORAL | Status: DC
  Administered 2023-04-05 – 2023-04-11 (×5): 75 ug via ORAL
  Filled 2023-04-04 (×5): qty 1

## 2023-04-04 MED ORDER — POLYETHYLENE GLYCOL 3350 17 GM/SCOOP PO POWD
1.0000 | Freq: Once | ORAL | Status: AC
  Administered 2023-04-04: 255 g via ORAL
  Filled 2023-04-04: qty 255

## 2023-04-04 MED ORDER — SIMVASTATIN 20 MG PO TABS
20.0000 mg | ORAL_TABLET | Freq: Every day | ORAL | Status: DC
  Administered 2023-04-05 – 2023-04-10 (×4): 20 mg via ORAL
  Filled 2023-04-04 (×5): qty 1

## 2023-04-04 NOTE — ED Notes (Signed)
ED TO INPATIENT HANDOFF REPORT  ED Nurse Name and Phone #: Huntley Dec [phone]  S Name/Age/Gender Chad Fisher 89 y.o. male Room/Bed: WA24/WA24  Code Status   Code Status: Full Code  Home/SNF/Other Patient oriented to: self, place, time, and situation Is this baseline? Yes   Triage Complete: Triage complete  Chief Complaint Hematochezia [K92.1]  Triage Note Pt states he had one stool today with bright red bleeding. Denies N/V/D   Allergies No Known Allergies  Level of Care/Admitting Diagnosis ED Disposition     ED Disposition  Admit   Condition  --   Comment  Hospital Area: [facility] [[id number]]  Level of Care: Med-Surg [16]  May place patient in observation at [facility] or [facility] if equivalent level of care is available:: No  Covid Evaluation: Asymptomatic - no recent exposure (last 10 days) testing not required  Diagnosis: Hematochezia [454098]  Admitting Physician: Alan Mulder [[id number]]  Attending Physician: Alan Mulder [[id number]]          B Medical/Surgery History Past Medical History:  Diagnosis Date   Cancer (HCC)    skin   Hernia 1987   right inguinal    Past Surgical History:  Procedure Laterality Date   CATARACT EXTRACTION, BILATERAL     HERNIA REPAIR     MOHS SURGERY     left ear     A IV Location/Drains/Wounds Patient Lines/Drains/Airways Status     Active Line/Drains/Airways     Name Placement date Placement time Site Days   Peripheral IV 04/04/23 20 G Anterior;Left;Proximal Forearm 04/04/23  1432  Forearm  less than 1            Intake/Output Last 24 hours No intake or output data in the 24 hours ending 04/04/23 1657  Labs/Imaging Results for orders placed or performed during the hospital encounter of 04/04/23 (from the past 48 hour(s))  CBC with Differential     Status: Abnormal   Collection Time: 04/04/23  2:30 PM  Result Value Ref Range   WBC 5.9 4.0 - 10.5 K/uL   RBC 4.21 (L) 4.22 -  5.81 MIL/uL   Hemoglobin 11.0 (L) 13.0 - 17.0 g/dL   HCT 08.6 (L) 57.8 - 46.9 %   MCV 84.6 80.0 - 100.0 fL   MCH 26.1 26.0 - 34.0 pg   MCHC 30.9 30.0 - 36.0 g/dL   RDW 62.9 52.8 - 41.3 %   Platelets 194 150 - 400 K/uL   nRBC 0.0 0.0 - 0.2 %   Neutrophils Relative % 63 %   Neutro Abs 3.7 1.7 - 7.7 K/uL   Lymphocytes Relative 22 %   Lymphs Abs 1.3 0.7 - 4.0 K/uL   Monocytes Relative 11 %   Monocytes Absolute 0.7 0.1 - 1.0 K/uL   Eosinophils Relative 3 %   Eosinophils Absolute 0.2 0.0 - 0.5 K/uL   Basophils Relative 1 %   Basophils Absolute 0.0 0.0 - 0.1 K/uL   Immature Granulocytes 0 %   Abs Immature Granulocytes 0.01 0.00 - 0.07 K/uL    Comment: Performed at [facility], [address] [place], [place] [address]  Comprehensive metabolic panel     Status: Abnormal   Collection Time: 04/04/23  2:30 PM  Result Value Ref Range   Sodium 140 135 - 145 mmol/L   Potassium 4.1 3.5 - 5.1 mmol/L   Chloride 107 98 - 111 mmol/L   CO2 25 22 - 32 mmol/L   Glucose,  Bld 127 (H) 70 - 99 mg/dL    Comment: Glucose reference range applies only to samples taken after fasting for at least 8 hours.   BUN 19 8 - 23 mg/dL   Creatinine, Ser 2.95 0.61 - 1.24 mg/dL   Calcium 8.5 (L) 8.9 - 10.3 mg/dL   Total Protein 6.9 6.5 - 8.1 g/dL   Albumin 3.4 (L) 3.5 - 5.0 g/dL   AST 25 15 - 41 U/L   ALT 23 0 - 44 U/L   Alkaline Phosphatase 108 38 - 126 U/L   Total Bilirubin 0.8 0.3 - 1.2 mg/dL   GFR, Estimated >62 >13 mL/min    Comment: (NOTE) Calculated using the CKD-EPI Creatinine Equation (2021)    Anion gap 8 5 - 15    Comment: Performed at [facility], [address], [place], [place] [address]   No results found.  Pending Labs Unresulted Labs (From admission, onward)     Start     Ordered   04/11/23 0500  Creatinine, serum  (enoxaparin (LOVENOX)    CrCl >/= 30 ml/min)  Weekly,   R     Comments: while on enoxaparin therapy    04/04/23 1647   04/04/23 1647   CBC  (enoxaparin (LOVENOX)    CrCl >/= 30 ml/min)  Once,   R       Comments: Baseline for enoxaparin therapy IF NOT ALREADY DRAWN.  Notify MD if PLT < 100 K.    04/04/23 1647   04/04/23 1647  Creatinine, serum  (enoxaparin (LOVENOX)    CrCl >/= 30 ml/min)  Once,   R       Comments: Baseline for enoxaparin therapy IF NOT ALREADY DRAWN.    04/04/23 1647            Vitals/Pain Today's Vitals   04/04/23 1414 04/04/23 1415  BP:  133/82  Pulse:  73  Resp:  15  Temp:  98.2 F (36.8 C)  TempSrc:  Oral  SpO2:  96%  Weight: 64.4 kg   Height: 5\' 8"  (1.727 m)   PainSc:  0-No pain    Isolation Precautions No active isolations  Medications Medications  simvastatin (ZOCOR) tablet 20 mg (has no administration in time range)  levothyroxine (SYNTHROID) tablet 75 mcg (has no administration in time range)  enoxaparin (LOVENOX) injection 40 mg (has no administration in time range)  acetaminophen (TYLENOL) tablet 650 mg (has no administration in time range)    Or  acetaminophen (TYLENOL) suppository 650 mg (has no administration in time range)  oxyCODONE (Oxy IR/ROXICODONE) immediate release tablet 5 mg (has no administration in time range)  ondansetron (ZOFRAN) tablet 4 mg (has no administration in time range)    Or  ondansetron (ZOFRAN) injection 4 mg (has no administration in time range)    Mobility walks     Focused Assessments   R Recommendations: See Admitting Provider Note  Report given to:   Additional Notes:

## 2023-04-04 NOTE — ED Provider Notes (Signed)
[place] EMERGENCY DEPARTMENT AT [facility] Provider Note   CSN: [id number] Arrival date & time: 04/04/23  1405     History Chief Complaint  Patient presents with   Rectal Bleeding    HPI Chad Fisher is a 89 y.o. male presenting for chief complaint of rectal bleeding.  States he has had a large volume bright red blood per rectum today.  It is started to decrease in intensity.  He denies fevers chills nausea vomiting syncope shortness of breath.  Otherwise ambulatory tolerating p.o. intake.  No known sick contacts.  States he did have some lightheadedness with the episode earlier today but that is slowly improving.   Patient's recorded medical, surgical, social, medication list and allergies were reviewed in the Snapshot window as part of the initial history.   Review of Systems   Review of Systems  Constitutional:  Negative for chills and fever.  HENT:  Negative for ear pain and sore throat.   Eyes:  Negative for pain and visual disturbance.  Respiratory:  Negative for cough and shortness of breath.   Cardiovascular:  Negative for chest pain and palpitations.  Gastrointestinal:  Positive for blood in stool. Negative for abdominal pain and vomiting.  Genitourinary:  Negative for dysuria and hematuria.  Musculoskeletal:  Negative for arthralgias and back pain.  Skin:  Negative for color change and rash.  Neurological:  Positive for light-headedness. Negative for seizures and syncope.  All other systems reviewed and are negative.   Physical Exam Updated Vital Signs BP (!) 149/75 (BP Location: Right Arm)   Pulse 67   Temp 98.2 F (36.8 C) (Oral)   Resp 16   Ht 5\' 8"  (1.727 m)   Wt 64.4 kg   SpO2 99%   BMI 21.59 kg/m  Physical Exam Vitals and nursing note reviewed.  Constitutional:      General: He is not in acute distress.    Appearance: He is well-developed.  HENT:     Head: Normocephalic and atraumatic.  Eyes:     Conjunctiva/sclera: Conjunctivae  normal.  Cardiovascular:     Rate and Rhythm: Normal rate and regular rhythm.     Heart sounds: No murmur heard. Pulmonary:     Effort: Pulmonary effort is normal. No respiratory distress.     Breath sounds: Normal breath sounds.  Abdominal:     Palpations: Abdomen is soft.     Tenderness: There is no abdominal tenderness.  Genitourinary:    Comments: Bright red blood in rectal vault. Musculoskeletal:        General: No swelling.     Cervical back: Neck supple.  Skin:    General: Skin is warm and dry.     Capillary Refill: Capillary refill takes less than 2 seconds.  Neurological:     Mental Status: He is alert.  Psychiatric:        Mood and Affect: Mood normal.      ED Course/ Medical Decision Making/ A&P Clinical Course as of 04/04/23 2137  Sun Apr 04, 2023  1600 Dr. Katherina Mires of [facility] will see [CC]    Clinical Course User Index [CC] Glyn Ade, MD    Procedures Procedures   Medications Ordered in ED Medications  simvastatin (ZOCOR) tablet 20 mg (has no administration in time range)  levothyroxine (SYNTHROID) tablet 75 mcg (has no administration in time range)  enoxaparin (LOVENOX) injection 40 mg (has no administration in time range)  acetaminophen (TYLENOL) tablet 650 mg (has no  administration in time range)    Or  acetaminophen (TYLENOL) suppository 650 mg (has no administration in time range)  oxyCODONE (Oxy IR/ROXICODONE) immediate release tablet 5 mg (has no administration in time range)  ondansetron (ZOFRAN) tablet 4 mg (has no administration in time range)    Or  ondansetron (ZOFRAN) injection 4 mg (has no administration in time range)  polyethylene glycol powder (GLYCOLAX/MIRALAX) container 255 g (has no administration in time range)    Medical Decision Making:    Chad Fisher is a 89 y.o. male who presented to the ED today with bright red blood per rectum detailed above.      Complete initial physical exam performed, notably the patient  was  hemodynamically stable no acute distress.      Reviewed and confirmed nursing documentation for past medical history, family history, social history.    Initial Assessment:   With the patient's presentation of bright red blood per rectum, most likely diagnosis is lower GI bleed. Other diagnoses were considered including (but not limited to) upper GI bleed, peptic ulcer disease, AVM, diverticular disease. These are considered less likely due to history of present illness and physical exam findings.   This is most consistent with an acute life/limb threatening illness complicated by underlying chronic conditions.  Initial Plan:  Considered CT angiography, however he is not having any acute symptoms and appears to be improving at this time Screening labs including CBC and Metabolic panel to evaluate for infectious or metabolic etiology of disease.  Urinalysis with reflex culture ordered to evaluate for UTI or relevant urologic/nephrologic pathology.  Objective evaluation as below reviewed with plan for close reassessment  Initial Study Results:   Laboratory  All laboratory results reviewed without evidence of clinically relevant pathology.     EKG EKG was reviewed independently. Rate, rhythm, axis, intervals all examined and without medically relevant abnormality. ST segments without concerns for elevations.    Final Assessment and Plan:   Consulted hospitalist and [facility] who recommended admission for observation overnight and gastroenterologic consultation. Disposition:   Based on the above findings, I believe this patient is stable for admission.    Patient/family educated about specific findings on our evaluation and explained exact reasons for admission.  Patient/family educated about clinical situation and time was allowed to answer questions.   Admission team communicated with and agreed with need for admission. Patient admitted. Patient  ready to move at this time.     Emergency  Department Medication Summary:   Medications  simvastatin (ZOCOR) tablet 20 mg (has no administration in time range)  levothyroxine (SYNTHROID) tablet 75 mcg (has no administration in time range)  enoxaparin (LOVENOX) injection 40 mg (has no administration in time range)  acetaminophen (TYLENOL) tablet 650 mg (has no administration in time range)    Or  acetaminophen (TYLENOL) suppository 650 mg (has no administration in time range)  oxyCODONE (Oxy IR/ROXICODONE) immediate release tablet 5 mg (has no administration in time range)  ondansetron (ZOFRAN) tablet 4 mg (has no administration in time range)    Or  ondansetron (ZOFRAN) injection 4 mg (has no administration in time range)  polyethylene glycol powder (GLYCOLAX/MIRALAX) container 255 g (has no administration in time range)         Clinical Impression:  1. Rectal bleeding      Admit   Final Clinical Impression(s) / ED Diagnoses Final diagnoses:  Rectal bleeding    Rx / DC Orders ED Discharge Orders  None         Glyn Ade, MD 04/04/23 2137

## 2023-04-04 NOTE — ED Triage Notes (Signed)
Pt states he had one stool today with bright red bleeding. Denies N/V/D

## 2023-04-04 NOTE — Plan of Care (Signed)
  Problem: Education: Goal: Knowledge of General Education information will improve Description: Including pain rating scale, medication(s)/side effects and non-pharmacologic comfort measures Outcome: Progressing   Problem: Activity: Goal: Risk for activity intolerance will decrease Outcome: Progressing   Problem: Clinical Measurements: Goal: Diagnostic test results will improve Outcome: Progressing   

## 2023-04-04 NOTE — Telephone Encounter (Signed)
I received a message from our on-call phone service by Nathanial Rancher wife to Festus Holts, message documented patient went to the bathroom and toilet was full of blood.  I called the provided phone number [phone] x [phone] and the phone just rang without an answer.  I also called the patient's mobile phone and wife's alternative phone number without an answer. Patient has a new patient appointment with our physician assistant Hyacinth Meeker on 04/07/2023.  As the patient is not a current active patient with [facility] the only instruction I will be able to provide at this time, if I am able to reach the patient, is to present to the ED for further evaluation and to contact his PCP.

## 2023-04-04 NOTE — H&P (Signed)
History and Physical    Chad Fisher ZOX:[id number] DOB: 05/03/1933 DOA: 04/04/2023  PCP: Shelva Majestic, MD   Chief Complaint: hematochezia  HPI: Chad Fisher is a 89 y.o. male with medical history significant of hyperlipidemia, hypothyroidism who presented to the emergency department due to large hematochezia.  Patient states that he has been having intermittent hematochezia for several months.  He saw his family medicine doctor who referred him to gastroenterology due to deficiency anemia and a positive fecal occult blood test.  He was scheduled to see them this upcoming Wednesday.  Unfortunately he went to church this morning developed hematochezia.  He started feel lightheaded so he presented to the ER for further assessment.  On arrival his hemoglobin had dropped to 11.0 from baseline around 13-14.  Remaining labs were unrevealing.  GI was consulted in the emergency department and recommended further evaluation in the hospital.  On assessing the patient he has bright red blood per rectum.  He denies any fever or chills.  He does not take any ibuprofen or NSAIDs and only takes Tylenol for pain.  He states he had a colonoscopy in 2008.  Report in chart reveals normal findings other than diverticulosis.   Review of Systems: Review of Systems  All other systems reviewed and are negative.    As per HPI otherwise 10 point review of systems negative.   No Known Allergies  Past Medical History:  Diagnosis Date   Cancer (HCC)    skin   Hernia 1987   right inguinal     Past Surgical History:  Procedure Laterality Date   CATARACT EXTRACTION, BILATERAL     HERNIA REPAIR     MOHS SURGERY     left ear     reports that he has never smoked. He has never used smokeless tobacco. He reports that he does not drink alcohol and does not use drugs.  Family History  Problem Relation Age of Onset   Anemia Father    Benign prostatic hyperplasia Brother    Cancer Daughter    Breast cancer  Daughter    Kidney cancer Son        doing ok    Prior to Admission medications   Medication Sig Start Date End Date Taking? Authorizing Provider  acetaminophen (TYLENOL) 500 MG tablet Take 1,000 mg by mouth every 6 (six) hours as needed for fever.   Yes [provider]  cyanocobalamin (VITAMIN B12) 1000 MCG/ML injection INJECT 1mL INTRAMUSCULARLY EVERY 30 DAYS Patient taking differently: Inject 1,000 mcg into the muscle every 30 (thirty) days. 12/01/22  Yes Shelva Majestic, MD  levothyroxine (SYNTHROID) 75 MCG tablet TAKE 1 TABLET BY MOUTH DAILY Patient taking differently: Take 75 mcg by mouth daily before breakfast. 10/06/22  Yes Shelva Majestic, MD  Multiple Vitamins-Minerals (ICAPS AREDS 2 PO) Take 1 capsule by mouth 2 (two) times daily.   Yes [provider]  Potassium Citrate 15 MEQ (1620 MG) TBCR Take 1 tablet by mouth daily. 09/22/18  Yes [provider]  simvastatin (ZOCOR) 20 MG tablet TAKE 1 TABLET BY MOUTH DAILY Patient taking differently: Take 20 mg by mouth every evening. 10/06/22  Yes Shelva Majestic, MD    Physical Exam: Vitals:   04/04/23 1414 04/04/23 1415 04/04/23 1733  BP:  133/82 (!) 149/75  Pulse:  73 67  Resp:  15 16  Temp:  98.2 F (36.8 C)   TempSrc:  Oral   SpO2:  96% 99%  Weight: 64.4 kg    Height: 5\' 8"  (1.727 m)     Physical Exam Constitutional:      Appearance: He is normal weight.  HENT:     Head: Normocephalic.     Nose: Nose normal.     Mouth/Throat:     Mouth: Mucous membranes are moist.     Pharynx: Oropharynx is clear.  Eyes:     Pupils: Pupils are equal, round, and reactive to light.  Cardiovascular:     Rate and Rhythm: Normal rate.     Heart sounds: Normal heart sounds.  Pulmonary:     Effort: Pulmonary effort is normal.     Breath sounds: Normal breath sounds.  Abdominal:     General: Abdomen is flat. Bowel sounds are normal.  Musculoskeletal:        General: Normal range of motion.      Cervical back: Normal range of motion.  Skin:    General: Skin is warm.     Capillary Refill: Capillary refill takes less than 2 seconds.  Neurological:     General: No focal deficit present.     Mental Status: He is alert. Mental status is at baseline.    BRBPR   Labs on Admission: I have personally reviewed the patients's labs and imaging studies.  Assessment/Plan Principal Problem:   Hematochezia   # Hematochezia - Most likely due to diverticular bleed given history of diverticulosis - Last colonoscopy 2008 with no evidence of polyps -Other differentials include malignancy, ischemic colitis, solitary rectal ulcer, IBD -2 to 3 g hemoglobin drop Plan: GI consulted in ER Place patient on clear liquid diet Start MiraLAX prep in the event that inpatient colonoscopy is warranted per GI Trend CBC  # Hypothyroidism-continue levothyroxine # Hyperlipidemia-continue statin  Admission status: Observation Med-Surg  Certification: The appropriate patient status for this patient is OBSERVATION. Observation status is judged to be reasonable and necessary in order to provide the required intensity of service to ensure the patient's safety. The patient's presenting symptoms, physical exam findings, and initial radiographic and laboratory data in the context of their medical condition is felt to place them at decreased risk for further clinical deterioration. Furthermore, it is anticipated that the patient will be medically stable for discharge from the hospital within 2 midnights of admission.     Alan Mulder MD [facility] If 7PM-7AM, please contact night-coverage [email or web address]  04/04/2023, 8:21 PM

## 2023-04-04 NOTE — Plan of Care (Signed)
°  Problem: Coping: °Goal: Level of anxiety will decrease °Outcome: Progressing °  °

## 2023-04-04 NOTE — Telephone Encounter (Signed)
See prior phone note.  I called the patient at this time and was able to reach him.  I informed the patient to go to the emergency room due to having a large amount of blood in the toilet bowl at this time and for him to contact his PCP.  He was instructed to keep his new patient appointment with Hyacinth Meeker, PA-C 04/07/2023 as scheduled.  Patient verbalized understanding these instructions.  Welton Flakes

## 2023-04-05 ENCOUNTER — Telehealth: Payer: Self-pay | Admitting: Family Medicine

## 2023-04-05 DIAGNOSIS — E039 Hypothyroidism, unspecified: Secondary | ICD-10-CM

## 2023-04-05 DIAGNOSIS — R54 Age-related physical debility: Secondary | ICD-10-CM | POA: Diagnosis present

## 2023-04-05 DIAGNOSIS — Z85828 Personal history of other malignant neoplasm of skin: Secondary | ICD-10-CM | POA: Diagnosis not present

## 2023-04-05 DIAGNOSIS — E782 Mixed hyperlipidemia: Secondary | ICD-10-CM | POA: Diagnosis not present

## 2023-04-05 DIAGNOSIS — E440 Moderate protein-calorie malnutrition: Secondary | ICD-10-CM | POA: Diagnosis present

## 2023-04-05 DIAGNOSIS — C772 Secondary and unspecified malignant neoplasm of intra-abdominal lymph nodes: Secondary | ICD-10-CM | POA: Diagnosis present

## 2023-04-05 DIAGNOSIS — Z8051 Family history of malignant neoplasm of kidney: Secondary | ICD-10-CM | POA: Diagnosis not present

## 2023-04-05 DIAGNOSIS — K921 Melena: Secondary | ICD-10-CM | POA: Diagnosis not present

## 2023-04-05 DIAGNOSIS — K6389 Other specified diseases of intestine: Secondary | ICD-10-CM | POA: Diagnosis not present

## 2023-04-05 DIAGNOSIS — Z79890 Hormone replacement therapy (postmenopausal): Secondary | ICD-10-CM | POA: Diagnosis not present

## 2023-04-05 DIAGNOSIS — N289 Disorder of kidney and ureter, unspecified: Secondary | ICD-10-CM | POA: Diagnosis not present

## 2023-04-05 DIAGNOSIS — E785 Hyperlipidemia, unspecified: Secondary | ICD-10-CM | POA: Diagnosis present

## 2023-04-05 DIAGNOSIS — Z6821 Body mass index (BMI) 21.0-21.9, adult: Secondary | ICD-10-CM | POA: Diagnosis not present

## 2023-04-05 DIAGNOSIS — D630 Anemia in neoplastic disease: Secondary | ICD-10-CM | POA: Diagnosis present

## 2023-04-05 DIAGNOSIS — Z8719 Personal history of other diseases of the digestive system: Secondary | ICD-10-CM | POA: Diagnosis not present

## 2023-04-05 DIAGNOSIS — C189 Malignant neoplasm of colon, unspecified: Secondary | ICD-10-CM | POA: Diagnosis not present

## 2023-04-05 DIAGNOSIS — Z803 Family history of malignant neoplasm of breast: Secondary | ICD-10-CM | POA: Diagnosis not present

## 2023-04-05 DIAGNOSIS — Z79899 Other long term (current) drug therapy: Secondary | ICD-10-CM | POA: Diagnosis not present

## 2023-04-05 DIAGNOSIS — R918 Other nonspecific abnormal finding of lung field: Secondary | ICD-10-CM | POA: Diagnosis not present

## 2023-04-05 DIAGNOSIS — Z87448 Personal history of other diseases of urinary system: Secondary | ICD-10-CM | POA: Diagnosis not present

## 2023-04-05 DIAGNOSIS — I739 Peripheral vascular disease, unspecified: Secondary | ICD-10-CM | POA: Diagnosis not present

## 2023-04-05 DIAGNOSIS — K625 Hemorrhage of anus and rectum: Secondary | ICD-10-CM | POA: Diagnosis present

## 2023-04-05 DIAGNOSIS — D510 Vitamin B12 deficiency anemia due to intrinsic factor deficiency: Secondary | ICD-10-CM | POA: Diagnosis present

## 2023-04-05 DIAGNOSIS — D638 Anemia in other chronic diseases classified elsewhere: Secondary | ICD-10-CM | POA: Diagnosis not present

## 2023-04-05 DIAGNOSIS — C182 Malignant neoplasm of ascending colon: Secondary | ICD-10-CM | POA: Diagnosis not present

## 2023-04-05 DIAGNOSIS — D123 Benign neoplasm of transverse colon: Secondary | ICD-10-CM | POA: Diagnosis present

## 2023-04-05 DIAGNOSIS — N200 Calculus of kidney: Secondary | ICD-10-CM | POA: Diagnosis not present

## 2023-04-05 DIAGNOSIS — C180 Malignant neoplasm of cecum: Secondary | ICD-10-CM | POA: Diagnosis present

## 2023-04-05 LAB — CBC
HCT: 35.3 % — ABNORMAL LOW (ref 39.0–52.0)
Hemoglobin: 10.9 g/dL — ABNORMAL LOW (ref 13.0–17.0)
MCH: 26.2 pg (ref 26.0–34.0)
MCHC: 30.9 g/dL (ref 30.0–36.0)
MCV: 84.9 fL (ref 80.0–100.0)
Platelets: 188 10*3/uL (ref 150–400)
RBC: 4.16 MIL/uL — ABNORMAL LOW (ref 4.22–5.81)
RDW: 15.1 % (ref 11.5–15.5)
WBC: 6.6 10*3/uL (ref 4.0–10.5)
nRBC: 0 % (ref 0.0–0.2)

## 2023-04-05 LAB — IRON AND TIBC
Iron: 36 ug/dL — ABNORMAL LOW (ref 45–182)
Saturation Ratios: 10 % — ABNORMAL LOW (ref 17.9–39.5)
TIBC: 378 ug/dL (ref 250–450)
UIBC: 342 ug/dL

## 2023-04-05 LAB — PROTIME-INR
INR: 1.1 (ref 0.8–1.2)
Prothrombin Time: 14.4 seconds (ref 11.4–15.2)

## 2023-04-05 MED ORDER — PEG-KCL-NACL-NASULF-NA ASC-C 100 G PO SOLR
0.5000 | ORAL | Status: AC
  Administered 2023-04-05 – 2023-04-06 (×2): 100 g via ORAL
  Filled 2023-04-05: qty 1

## 2023-04-05 MED ORDER — LACTATED RINGERS IV SOLN: INTRAVENOUS | Status: DC

## 2023-04-05 NOTE — Progress Notes (Signed)
   04/05/23 1000  TOC Brief Assessment  Insurance and Status Reviewed  Patient has primary care physician Yes  Home environment has been reviewed Resides with spouse  Prior level of function: Independent at baseline  Prior/Current Home Services No current home services  Social Determinants of Health Reivew SDOH reviewed no interventions necessary  Readmission risk has been reviewed Yes  Transition of care needs no transition of care needs at this time

## 2023-04-05 NOTE — Hospital Course (Addendum)
89 year old man with intermittent hematochezia for several months, due to see GI as an outpatient this week, who presented 6/23 with acute on chronic hematochezia, lightheadedness.  Admitted for GI bleed, likely diverticular in nature, GI consultation.  Consultants GI  Procedures None

## 2023-04-05 NOTE — Progress Notes (Signed)
  Progress Note   Patient: Chad Fisher ZOX:[id number] DOB: 1933-10-03 DOA: 04/04/2023     0 DOS: the patient was seen and examined on 04/05/2023   Brief hospital course: 89 year old man with intermittent hematochezia for several months, due to see GI as an outpatient this week, who presented 6/23 with acute on chronic hematochezia, lightheadedness.  Admitted for GI bleed, likely diverticular in nature, GI consultation.  Consultants GI  Procedures None  Assessment and Plan: Hematochezia Most likely due to diverticular bleed given history of diverticulosis Last colonoscopy 2008 with no evidence of polyps Hemoglobin stable since admission, appears to be at baseline from values in March April and May.  No bleeding since admission to room. Status post prep in case GI procedures inpatient evaluation. Await GI consultation.  Continue NPO.   Hypothyroidism Continue levothyroxine  Hyperlipidemia Continue statin       Subjective:  Feels ok No bleeding since admission No pain Breathing fine  Physical Exam: Vitals:   04/04/23 1733 04/04/23 2218 04/05/23 0145 04/05/23 0536  BP: (!) 149/75 136/68 (!) 147/67 116/64  Pulse: 67 60 65 62  Resp: 16 17 17 17   Temp:  98.5 F (36.9 C) 98.9 F (37.2 C) 98.6 F (37 C)  TempSrc:  Oral Oral Oral  SpO2: 99% 99% 97% 96%  Weight:      Height:       Physical Exam Vitals reviewed.  Constitutional:      General: He is not in acute distress.    Appearance: He is not ill-appearing or toxic-appearing.  Cardiovascular:     Rate and Rhythm: Normal rate and regular rhythm.     Heart sounds: No murmur heard. Pulmonary:     Effort: Pulmonary effort is normal. No respiratory distress.     Breath sounds: No wheezing, rhonchi or rales.  Abdominal:     General: There is no distension.     Palpations: Abdomen is soft.     Tenderness: There is no abdominal tenderness. There is no guarding.  Neurological:     Mental Status: He is alert.   Psychiatric:        Mood and Affect: Mood normal.        Behavior: Behavior normal.     Data Reviewed: CMP noted Hgb 11.0 > 10.9; baseline 11s  Family Communication: none present or requested  Disposition: Status is: Observation   Planned Discharge Destination: Home    Time spent: 25 minutes  Author: Brendia Sacks, MD 04/05/2023 8:04 AM  For on call review [email or web address].

## 2023-04-05 NOTE — H&P (View-Only) (Signed)
     Consultation  Referring Provider:  TR[facility]  Primary Care Physician:  Hunter, Stephen O, MD Primary Gastroenterologist:  Prior patient of Dr. Fayette       Reason for Consultation:     hematochezia  LOS: 0 days          HPI:   Chad Fisher is a 89 y.o. male with past medical history significant for hypothyroidism, hyperlipidemia, presents for evaluation of hematochezia.  Patient states yesterday after church he went to have a bowel movement and one episode of large-volume bright red hematochezia that filled the toilet bowl.  He reports associated lightheadedness.  Denies rectal pain.  Denies abdominal pain.  Denies nausea/vomiting.  States this is only happened 1 other time which was a few months ago and was a scant amount of bleeding.  Has not had further bleeding since yesterday.  Has had 3 bowel movements since being admitted all without signs of blood. He does report an 8lb weight loss since March.   Baseline hemoglobin 13-14.  Upon presentation hemoglobin was 11.7.  Today it is 10.9. Iron 36, TIBC 378, saturation 10% BUN 19, creatinine 1.13  Last colonoscopy in 2008 showed diverticulosis, otherwise normal.  Patient was seen by PCP April 2024.  At that time fecal occult was positive and he was referred to GI for further workup.  He has an appointment Wednesday 6/26 AM.  Fecal occult also positive 02/19/2023   Past Medical History:  Diagnosis Date   Cancer (HCC)    skin   Hernia 1987   right inguinal     Surgical History:  He  has a past surgical history that includes Mohs surgery; Hernia repair; and Cataract extraction, bilateral. Family History:  His family history includes Anemia in his father; Benign prostatic hyperplasia in his brother; Breast cancer in his daughter; Cancer in his daughter; Kidney cancer in his son. Social History:   reports that he has never smoked. He has never used smokeless tobacco. He reports that he does not drink alcohol and does not use  drugs.  Prior to Admission medications   Medication Sig Start Date End Date Taking? Authorizing Provider  acetaminophen (TYLENOL) 500 MG tablet Take 1,000 mg by mouth every 6 (six) hours as needed for fever.   Yes [provider]  cyanocobalamin (VITAMIN B12) 1000 MCG/ML injection INJECT 1mL INTRAMUSCULARLY EVERY 30 DAYS Patient taking differently: Inject 1,000 mcg into the muscle every 30 (thirty) days. 12/01/22  Yes Hunter, Stephen O, MD  levothyroxine (SYNTHROID) 75 MCG tablet TAKE 1 TABLET BY MOUTH DAILY Patient taking differently: Take 75 mcg by mouth daily before breakfast. 10/06/22  Yes Hunter, Stephen O, MD  Multiple Vitamins-Minerals (ICAPS AREDS 2 PO) Take 1 capsule by mouth 2 (two) times daily.   Yes [provider]  Potassium Citrate 15 MEQ (1620 MG) TBCR Take 1 tablet by mouth daily. 09/22/18  Yes [provider]  simvastatin (ZOCOR) 20 MG tablet TAKE 1 TABLET BY MOUTH DAILY Patient taking differently: Take 20 mg by mouth every evening. 10/06/22  Yes Hunter, Stephen O, MD    Current Facility-Administered Medications  Medication Dose Route Frequency Provider Last Rate Last Admin   acetaminophen (TYLENOL) tablet 650 mg  650 mg Oral Q6H PRN Dorrell, Robert, MD       Or   acetaminophen (TYLENOL) suppository 650 mg  650 mg Rectal Q6H PRN Dorrell, Robert, MD       lactated ringers infusion   Intravenous Continuous   Goodrich, Daniel P, MD       levothyroxine (SYNTHROID) tablet 75 mcg  75 mcg Oral QAC breakfast Dorrell, Robert, MD   75 mcg at 04/05/23 0555   ondansetron (ZOFRAN) tablet 4 mg  4 mg Oral Q6H PRN Dorrell, Robert, MD       Or   ondansetron (ZOFRAN) injection 4 mg  4 mg Intravenous Q6H PRN Dorrell, Robert, MD       simvastatin (ZOCOR) tablet 20 mg  20 mg Oral Daily Dorrell, Robert, MD   20 mg at 04/05/23 0818    Allergies as of 04/04/2023   (No Known Allergies)    Review of Systems  Constitutional:  Positive for weight loss. Negative for  chills and fever.  HENT:  Negative for hearing loss and tinnitus.   Eyes:  Negative for blurred vision and double vision.  Respiratory:  Negative for cough and hemoptysis.   Cardiovascular:  Negative for chest pain and palpitations.  Gastrointestinal:  Positive for blood in stool. Negative for abdominal pain, constipation, diarrhea, heartburn, melena, nausea and vomiting.  Genitourinary:  Negative for dysuria and urgency.  Musculoskeletal:  Negative for myalgias and neck pain.  Skin:  Negative for itching and rash.  Neurological:  Negative for seizures and loss of consciousness.  Psychiatric/Behavioral:  Negative for depression and suicidal ideas.        Physical Exam:  Vital signs in last 24 hours: Temp:  [98 F (36.7 C)-98.9 F (37.2 C)] 98 F (36.7 C) (06/24 0903) Pulse Rate:  [58-73] 58 (06/24 0903) Resp:  [15-17] 17 (06/24 0903) BP: (116-149)/(64-82) 139/70 (06/24 0903) SpO2:  [96 %-99 %] 97 % (06/24 0903) Weight:  [64.4 kg] 64.4 kg (06/23 1414) Last BM Date : 04/03/23 Last BM recorded by nurses in past 5 days No data recorded  Physical Exam Constitutional:      Appearance: Normal appearance. He is normal weight.  HENT:     Nose: Nose normal.     Mouth/Throat:     Mouth: Mucous membranes are moist.     Pharynx: Oropharynx is clear.  Eyes:     Extraocular Movements: Extraocular movements intact.     Conjunctiva/sclera: Conjunctivae normal.  Cardiovascular:     Rate and Rhythm: Normal rate and regular rhythm.  Pulmonary:     Effort: Pulmonary effort is normal. No respiratory distress.  Abdominal:     General: Abdomen is flat. Bowel sounds are normal. There is no distension.     Palpations: Abdomen is soft. There is no mass.     Tenderness: There is no abdominal tenderness. There is no guarding or rebound.     Hernia: No hernia is present.  Genitourinary:    Comments: Patient politely declined Musculoskeletal:        General: No swelling. Normal range of motion.      Cervical back: Normal range of motion and neck supple.  Skin:    General: Skin is warm and dry.  Neurological:     General: No focal deficit present.     Mental Status: He is oriented to person, place, and time.  Psychiatric:        Mood and Affect: Mood normal.        Behavior: Behavior normal.        Thought Content: Thought content normal.        Judgment: Judgment normal.      LAB RESULTS: Recent Labs    04/04/23 1430 04/05/23 0318  WBC 5.9 6.6    HGB 11.0* 10.9*  HCT 35.6* 35.3*  PLT 194 188   BMET Recent Labs    04/04/23 1430  NA 140  K 4.1  CL 107  CO2 25  GLUCOSE 127*  BUN 19  CREATININE 1.13  CALCIUM 8.5*   LFT Recent Labs    04/04/23 1430  PROT 6.9  ALBUMIN 3.4*  AST 25  ALT 23  ALKPHOS 108  BILITOT 0.8   PT/INR Recent Labs    04/05/23 0318  LABPROT 14.4  INR 1.1    STUDIES: No results found.    Impression    Hematochezia - hgb 10.9 -BUN 19, creatinine 1.13 -Iron 36, TIBC 378, saturation 10% -Colonoscopy in 2008 with diverticulosis -Patient declined rectal exam though per EDP showed bright red blood in rectal vault. Hemoglobin stable, no further bleeding since being admitted.  Only 1 episode of bleeding and history of diverticulosis.  Suspect could be diverticular bleed versus hemorrhoidal etiology.  Though there is a component of weight loss (8 pounds in 3 months). He has an appt with Jennifer Lemmon PA-C 6/26.    Plan   -With history of weight loss and rectal bleeding could consider CT ab/pelvis with contrast for further evaluation (as an outpatient). Colonoscopy would be high risk with his age. However, patient does appear to be in good health if scope was absolutely necessary. - Can advance to soft diet - Observe for one more night and then consider discharge tomorrow with follow up appt Wednesday -Continue daily CBC and transfuse as needed to maintain HGB > 7    Thank you for your kind consultation, we will continue to  follow.   Bayley M McMichael  04/05/2023, 9:09 AM    GI ATTENDING  History, laboratories, x-rays, prior colonoscopy report personally reviewed.  Patient personally interviewed, seen, and examined at the bedside this afternoon.  Agree with comprehensive consultation note as outlined above.  89-year-old with diverticulosis who is being sent into the office for Hemoccult positive stool.  Noted to be mildly anemic with iron saturation of 10%.  Remote colonoscopy with diverticulosis.  GI review of systems is fairly unremarkable.  Now presents to the hospital with pretty significant acute self-limited rectal bleeding.  Plans are for ongoing observation and supportive care and preparation for inpatient colonoscopy tomorrow.The nature of the procedure, as well as the risks, benefits, and alternatives were carefully and thoroughly reviewed with the patient. Ample time for discussion and questions allowed. The patient understood, was satisfied, and agreed to proceed.  The patient is higher than average risk due to his age.  Xiamara Hulet N. Brendan Gruwell, Jr., M.D. [facility] Division of Gastroenterology 

## 2023-04-05 NOTE — Telephone Encounter (Signed)
Currently in [facility] hospital  Patient Name First: Chad Last: Fisher Gender: Male DOB: 1933-02-15 Age: 89 Y 9 M 24 D Return Phone Number: [phone] (Primary) Address: City/ State/ Zip: [place]  [address] Client [facility] - Human resources officer [facility] [employer] Provider Tana Conch- MD Contact Type Call Who Is Calling Patient / Member / Family / Caregiver Call Type Triage / Clinical Relationship To Patient Self Return Phone Number [phone] (Primary) Chief Complaint Rectal Bleeding Reason for Call Symptomatic / Request for Health Information Initial Comment Caller states he had blood in his stool and was referred to GI, states he is still bleeding rectally and is unable to wait until then. Translation No Nurse Assessment Nurse: Cox, RN, Holly Date/Time (Eastern Time): 04/04/2023 1:31:17 PM Confirm and document reason for call. If symptomatic, describe symptoms. ---Caller states that he had an episode of rectal bleeding with some stool but not much and definitely more blood than anything else. Caller reports that this has only happened this one time, and denies any pain or blood thinners. Does the patient have any new or worsening symptoms? ---Yes Will a triage be completed? ---Yes Related visit to physician within the last 2 weeks? ---No Does the PT have any chronic conditions? (i.e. diabetes, asthma, this includes High risk factors for pregnancy, etc.) ---No Is this a behavioral health or substance abuse call? ---No Guidelines Guideline Title Affirmed Question Affirmed Notes Nurse Date/Time (Eastern Time) Rectal Bleeding Patient sounds very sick or weak to the triager Cox, RN, [facility] 04/04/2023 1:32:28 PM Disp. Time Lamount Cohen Time) Disposition Final User 04/04/2023 1:40:14 PM Go to ED Now (or PCP triage) Yes Cox, RN, [facility] Final Disposition 04/04/2023 1:40:14 PM Go to ED Now (or PCP triage) Yes  Cox, RN, Sharene Butters Disagree/Comply Comply Caller Understands Yes PreDisposition Did not know what to do Care Advice Given Per Guideline GO TO ED NOW (OR PCP TRIAGE): * IF NO PCP (PRIMARY CARE PROVIDER) SECOND-LEVEL TRIAGE: You need to be seen within the next hour. Go to the ED/UCC at _____________ Hospital. Leave as soon as you can. CARE ADVICE given per Rectal Bleeding (Adult) guideline. Referrals [facility] - ED [facility] - ED

## 2023-04-05 NOTE — Consult Note (Addendum)
Consultation  Referring Provider:  Edward Plainfield  Primary Care Physician:  Shelva Majestic, MD Primary Gastroenterologist:  Prior patient of Dr. Corinda Gubler       Reason for Consultation:     hematochezia  LOS: 0 days          HPI:   Chad Fisher is a 89 y.o. male with past medical history significant for hypothyroidism, hyperlipidemia, presents for evaluation of hematochezia.  Patient states yesterday after church he went to have a bowel movement and one episode of large-volume bright red hematochezia that filled the toilet bowl.  He reports associated lightheadedness.  Denies rectal pain.  Denies abdominal pain.  Denies nausea/vomiting.  States this is only happened 1 other time which was a few months ago and was a scant amount of bleeding.  Has not had further bleeding since yesterday.  Has had 3 bowel movements since being admitted all without signs of blood. He does report an 8lb weight loss since March.   Baseline hemoglobin 13-14.  Upon presentation hemoglobin was 11.7.  Today it is 10.9. Iron 36, TIBC 378, saturation 10% BUN 19, creatinine 1.13  Last colonoscopy in 2008 showed diverticulosis, otherwise normal.  Patient was seen by PCP April 2024.  At that time fecal occult was positive and he was referred to GI for further workup.  He has an appointment Wednesday 6/26 AM.  Fecal occult also positive 02/19/2023   Past Medical History:  Diagnosis Date   Cancer [facility])    skin   Hernia 1987   right inguinal     Surgical History:  He  has a past surgical history that includes Mohs surgery; Hernia repair; and Cataract extraction, bilateral. Family History:  His family history includes Anemia in his father; Benign prostatic hyperplasia in his brother; Breast cancer in his daughter; Cancer in his daughter; Kidney cancer in his son. Social History:   reports that he has never smoked. He has never used smokeless tobacco. He reports that he does not drink alcohol and does not use  drugs.  Prior to Admission medications   Medication Sig Start Date End Date Taking? Authorizing Provider  acetaminophen (TYLENOL) 500 MG tablet Take 1,000 mg by mouth every 6 (six) hours as needed for fever.   Yes [provider]  cyanocobalamin (VITAMIN B12) 1000 MCG/ML injection INJECT 1mL INTRAMUSCULARLY EVERY 30 DAYS Patient taking differently: Inject 1,000 mcg into the muscle every 30 (thirty) days. 12/01/22  Yes Shelva Majestic, MD  levothyroxine (SYNTHROID) 75 MCG tablet TAKE 1 TABLET BY MOUTH DAILY Patient taking differently: Take 75 mcg by mouth daily before breakfast. 10/06/22  Yes Shelva Majestic, MD  Multiple Vitamins-Minerals (ICAPS AREDS 2 PO) Take 1 capsule by mouth 2 (two) times daily.   Yes [provider]  Potassium Citrate 15 MEQ (1620 MG) TBCR Take 1 tablet by mouth daily. 09/22/18  Yes [provider]  simvastatin (ZOCOR) 20 MG tablet TAKE 1 TABLET BY MOUTH DAILY Patient taking differently: Take 20 mg by mouth every evening. 10/06/22  Yes Shelva Majestic, MD    Current Facility-Administered Medications  Medication Dose Route Frequency Provider Last Rate Last Admin   acetaminophen (TYLENOL) tablet 650 mg  650 mg Oral Q6H PRN Alan Mulder, MD       Or   acetaminophen (TYLENOL) suppository 650 mg  650 mg Rectal Q6H PRN Alan Mulder, MD       lactated ringers infusion   Intravenous Continuous  Standley Brooking, MD       levothyroxine (SYNTHROID) tablet 75 mcg  75 mcg Oral QAC breakfast Alan Mulder, MD   75 mcg at 04/05/23 0555   ondansetron (ZOFRAN) tablet 4 mg  4 mg Oral Q6H PRN Alan Mulder, MD       Or   ondansetron [facility]) injection 4 mg  4 mg Intravenous Q6H PRN Alan Mulder, MD       simvastatin (ZOCOR) tablet 20 mg  20 mg Oral Daily Alan Mulder, MD   20 mg at 04/05/23 0818    Allergies as of 04/04/2023   (No Known Allergies)    Review of Systems  Constitutional:  Positive for weight loss. Negative for  chills and fever.  HENT:  Negative for hearing loss and tinnitus.   Eyes:  Negative for blurred vision and double vision.  Respiratory:  Negative for cough and hemoptysis.   Cardiovascular:  Negative for chest pain and palpitations.  Gastrointestinal:  Positive for blood in stool. Negative for abdominal pain, constipation, diarrhea, heartburn, melena, nausea and vomiting.  Genitourinary:  Negative for dysuria and urgency.  Musculoskeletal:  Negative for myalgias and neck pain.  Skin:  Negative for itching and rash.  Neurological:  Negative for seizures and loss of consciousness.  Psychiatric/Behavioral:  Negative for depression and suicidal ideas.        Physical Exam:  Vital signs in last 24 hours: Temp:  [98 F (36.7 C)-98.9 F (37.2 C)] 98 F (36.7 C) (06/24 0903) Pulse Rate:  [58-73] 58 (06/24 0903) Resp:  [15-17] 17 (06/24 0903) BP: (116-149)/(64-82) 139/70 (06/24 0903) SpO2:  [96 %-99 %] 97 % (06/24 0903) Weight:  [64.4 kg] 64.4 kg (06/23 1414) Last BM Date : 04/03/23 Last BM recorded by nurses in past 5 days No data recorded  Physical Exam Constitutional:      Appearance: Normal appearance. He is normal weight.  HENT:     Nose: Nose normal.     Mouth/Throat:     Mouth: Mucous membranes are moist.     Pharynx: Oropharynx is clear.  Eyes:     Extraocular Movements: Extraocular movements intact.     Conjunctiva/sclera: Conjunctivae normal.  Cardiovascular:     Rate and Rhythm: Normal rate and regular rhythm.  Pulmonary:     Effort: Pulmonary effort is normal. No respiratory distress.  Abdominal:     General: Abdomen is flat. Bowel sounds are normal. There is no distension.     Palpations: Abdomen is soft. There is no mass.     Tenderness: There is no abdominal tenderness. There is no guarding or rebound.     Hernia: No hernia is present.  Genitourinary:    Comments: Patient politely declined Musculoskeletal:        General: No swelling. Normal range of motion.      Cervical back: Normal range of motion and neck supple.  Skin:    General: Skin is warm and dry.  Neurological:     General: No focal deficit present.     Mental Status: He is oriented to person, place, and time.  Psychiatric:        Mood and Affect: Mood normal.        Behavior: Behavior normal.        Thought Content: Thought content normal.        Judgment: Judgment normal.      LAB RESULTS: Recent Labs    04/04/23 1430 04/05/23 0318  WBC 5.9 6.6  HGB 11.0* 10.9*  HCT 35.6* 35.3*  PLT 194 188   BMET Recent Labs    04/04/23 1430  NA 140  K 4.1  CL 107  CO2 25  GLUCOSE 127*  BUN 19  CREATININE 1.13  CALCIUM 8.5*   LFT Recent Labs    04/04/23 1430  PROT 6.9  ALBUMIN 3.4*  AST 25  ALT 23  ALKPHOS 108  BILITOT 0.8   PT/INR Recent Labs    04/05/23 0318  LABPROT 14.4  INR 1.1    STUDIES: No results found.    Impression    Hematochezia - hgb 10.9 -BUN 19, creatinine 1.13 -Iron 36, TIBC 378, saturation 10% -Colonoscopy in 2008 with diverticulosis -Patient declined rectal exam though per EDP showed bright red blood in rectal vault. Hemoglobin stable, no further bleeding since being admitted.  Only 1 episode of bleeding and history of diverticulosis.  Suspect could be diverticular bleed versus hemorrhoidal etiology.  Though there is a component of weight loss (8 pounds in 3 months). He has an appt with Hyacinth Meeker PA-C 6/26.    Plan   -With history of weight loss and rectal bleeding could consider CT ab/pelvis with contrast for further evaluation (as an outpatient). Colonoscopy would be high risk with his age. However, patient does appear to be in good health if scope was absolutely necessary. - Can advance to soft diet - Observe for one more night and then consider discharge tomorrow with follow up appt Wednesday -Continue daily CBC and transfuse as needed to maintain HGB > 7    Thank you for your kind consultation, we will continue to  follow.   Bayley Leanna Sato  04/05/2023, 9:09 AM    GI ATTENDING  History, laboratories, x-rays, prior colonoscopy report personally reviewed.  Patient personally interviewed, seen, and examined at the bedside this afternoon.  Agree with comprehensive consultation note as outlined above.  89 year old with diverticulosis who is being sent into the office for Hemoccult positive stool.  Noted to be mildly anemic with iron saturation of 10%.  Remote colonoscopy with diverticulosis.  GI review of systems is fairly unremarkable.  Now presents to the hospital with pretty significant acute self-limited rectal bleeding.  Plans are for ongoing observation and supportive care and preparation for inpatient colonoscopy tomorrow.The nature of the procedure, as well as the risks, benefits, and alternatives were carefully and thoroughly reviewed with the patient. Ample time for discussion and questions allowed. The patient understood, was satisfied, and agreed to proceed.  The patient is higher than average risk due to his age.  Chad Bonito. Eda Keys., M.D. [facility] Division of Gastroenterology

## 2023-04-06 ENCOUNTER — Encounter (HOSPITAL_COMMUNITY): Admission: EM | Disposition: A | Discharge status: Home / Self Care | Payer: Self-pay | Source: Home / Self Care

## 2023-04-06 ENCOUNTER — Inpatient Hospital Stay (HOSPITAL_COMMUNITY): Payer: Medicare Other | Admitting: Certified Registered"

## 2023-04-06 ENCOUNTER — Ambulatory Visit: Payer: Medicare Other | Admitting: Podiatry

## 2023-04-06 ENCOUNTER — Encounter (HOSPITAL_COMMUNITY): Payer: Self-pay | Admitting: Internal Medicine

## 2023-04-06 ENCOUNTER — Inpatient Hospital Stay (HOSPITAL_COMMUNITY): Payer: Medicare Other

## 2023-04-06 DIAGNOSIS — K625 Hemorrhage of anus and rectum: Secondary | ICD-10-CM

## 2023-04-06 DIAGNOSIS — D638 Anemia in other chronic diseases classified elsewhere: Secondary | ICD-10-CM

## 2023-04-06 DIAGNOSIS — C182 Malignant neoplasm of ascending colon: Secondary | ICD-10-CM | POA: Diagnosis not present

## 2023-04-06 DIAGNOSIS — K6389 Other specified diseases of intestine: Secondary | ICD-10-CM

## 2023-04-06 DIAGNOSIS — E039 Hypothyroidism, unspecified: Secondary | ICD-10-CM | POA: Diagnosis not present

## 2023-04-06 DIAGNOSIS — D123 Benign neoplasm of transverse colon: Secondary | ICD-10-CM

## 2023-04-06 DIAGNOSIS — K921 Melena: Secondary | ICD-10-CM | POA: Diagnosis not present

## 2023-04-06 DIAGNOSIS — I739 Peripheral vascular disease, unspecified: Secondary | ICD-10-CM

## 2023-04-06 DIAGNOSIS — D649 Anemia, unspecified: Secondary | ICD-10-CM | POA: Insufficient documentation

## 2023-04-06 HISTORY — PX: POLYPECTOMY: SHX5525

## 2023-04-06 HISTORY — PX: BIOPSY: SHX5522

## 2023-04-06 HISTORY — PX: COLONOSCOPY: SHX5424

## 2023-04-06 HISTORY — PX: SUBMUCOSAL TATTOO INJECTION: SHX6856

## 2023-04-06 LAB — CBC
HCT: 38 % — ABNORMAL LOW (ref 39.0–52.0)
Hemoglobin: 11.8 g/dL — ABNORMAL LOW (ref 13.0–17.0)
MCH: 26.5 pg (ref 26.0–34.0)
MCHC: 31.1 g/dL (ref 30.0–36.0)
MCV: 85.4 fL (ref 80.0–100.0)
Platelets: 208 10*3/uL (ref 150–400)
RBC: 4.45 MIL/uL (ref 4.22–5.81)
RDW: 14.9 % (ref 11.5–15.5)
WBC: 6.1 10*3/uL (ref 4.0–10.5)
nRBC: 0 % (ref 0.0–0.2)

## 2023-04-06 SURGERY — COLONOSCOPY
Anesthesia: Monitor Anesthesia Care

## 2023-04-06 MED ORDER — EPHEDRINE SULFATE-NACL 50-0.9 MG/10ML-% IV SOSY
PREFILLED_SYRINGE | INTRAVENOUS | Status: DC | PRN
  Administered 2023-04-06: 5 mg via INTRAVENOUS
  Administered 2023-04-06: 10 mg via INTRAVENOUS
  Administered 2023-04-06 (×2): 5 mg via INTRAVENOUS

## 2023-04-06 MED ORDER — PROPOFOL 500 MG/50ML IV EMUL
INTRAVENOUS | Status: AC
  Filled 2023-04-06: qty 50

## 2023-04-06 MED ORDER — IOHEXOL 9 MG/ML PO SOLN
ORAL | Status: AC
  Filled 2023-04-06: qty 1000

## 2023-04-06 MED ORDER — SPOT INK MARKER SYRINGE KIT
PACK | SUBMUCOSAL | Status: DC | PRN
  Administered 2023-04-06: 9 mL via SUBMUCOSAL

## 2023-04-06 MED ORDER — IOHEXOL 300 MG/ML  SOLN
100.0000 mL | Freq: Once | INTRAMUSCULAR | Status: AC | PRN
  Administered 2023-04-06: 100 mL via INTRAVENOUS

## 2023-04-06 MED ORDER — PHENYLEPHRINE 80 MCG/ML (10ML) SYRINGE FOR IV PUSH (FOR BLOOD PRESSURE SUPPORT)
PREFILLED_SYRINGE | INTRAVENOUS | Status: DC | PRN
  Administered 2023-04-06 (×2): 80 ug via INTRAVENOUS

## 2023-04-06 MED ORDER — PROPOFOL 10 MG/ML IV BOLUS
INTRAVENOUS | Status: DC | PRN
  Administered 2023-04-06: 20 mg via INTRAVENOUS

## 2023-04-06 MED ORDER — LACTATED RINGERS IV SOLN
INTRAVENOUS | Status: DC
  Administered 2023-04-06: 1000 mL via INTRAVENOUS

## 2023-04-06 MED ORDER — SPOT INK MARKER SYRINGE KIT
PACK | SUBMUCOSAL | Status: AC
  Filled 2023-04-06: qty 5

## 2023-04-06 MED ORDER — GLYCOPYRROLATE 0.2 MG/ML IJ SOLN
INTRAMUSCULAR | Status: DC | PRN
  Administered 2023-04-06: .2 mg via INTRAVENOUS

## 2023-04-06 MED ORDER — LIDOCAINE 2% (20 MG/ML) 5 ML SYRINGE
INTRAMUSCULAR | Status: DC | PRN
  Administered 2023-04-06: 40 mg via INTRAVENOUS

## 2023-04-06 MED ORDER — PROPOFOL 500 MG/50ML IV EMUL
INTRAVENOUS | Status: DC | PRN
  Administered 2023-04-06: 125 ug/kg/min via INTRAVENOUS

## 2023-04-06 MED ORDER — SODIUM CHLORIDE 0.9 % IV SOLN: INTRAVENOUS | Status: DC

## 2023-04-06 MED ORDER — IOHEXOL 9 MG/ML PO SOLN
500.0000 mL | ORAL | Status: AC
  Administered 2023-04-06 (×2): 500 mL via ORAL

## 2023-04-06 NOTE — Transfer of Care (Signed)
Immediate Anesthesia Transfer of Care Note  Patient: Chad Fisher  Procedure(s) Performed: COLONOSCOPY  Patient Location: PACU and Endoscopy Unit  Anesthesia Type:MAC  Level of Consciousness: drowsy and responds to stimulation  Airway & Oxygen Therapy: Patient Spontanous Breathing and Patient connected to face mask oxygen  Post-op Assessment: Report given to RN and Post -op Vital signs reviewed and stable  Post vital signs: Reviewed and stable  Last Vitals:  Vitals Value Taken Time  BP 92/47   Temp    Pulse 90   Resp 21   SpO2 100     Last Pain:  Vitals:   04/06/23 1130  TempSrc: Temporal  PainSc: 0-No pain         Complications: No notable events documented.

## 2023-04-06 NOTE — Anesthesia Procedure Notes (Signed)
Procedure Name: MAC Date/Time: 04/06/2023 12:28 PM  Performed by: Sindy Guadeloupe, CRNAPre-anesthesia Checklist: Patient identified, Emergency Drugs available, Suction available, Patient being monitored and Timeout performed Oxygen Delivery Method: Simple face mask Placement Confirmation: positive ETCO2

## 2023-04-06 NOTE — Op Note (Signed)
NOTE: [facility] IS DOWN.  The following is the normal procedure report.  Unfortunately, no images.   COLONOSCOPY REPORT  Procedure: Colonoscopy with biopsies, submucosal injection, and snare polypectomy. Indication: Hematochezia Patient: Chad Fisher. Chad Fisher.  Date of birth: 25-Aug-1933 Endoscopist: Dr. [name], MD  Standard preprocedure assessment, intraprocedure monitoring, and post procedure transferred to recovery without issues.  FINDINGS: The adult colonoscope was advanced per rectum to the level of the right colon.  The preparation was excellent at that point a large malignant appearing mass was encountered.  It was difficult to tell whether the mass was sitting in the cecum or distal to the cecum with obstructing properties.  Multiple biopsies were taken.  Marking tattoos with Spot (carbon black) was made a few centimeters distal (downstream toward the anus) to the mass.  A 5 mm transverse colon polyp was removed with cold snare and submitted for pathologic analysis.  The remainder the examination of the colon was normal, including retroflexed view of the rectum.  IMPRESSION: 1.  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  Status post marking tattoo 2.  Small transverse colon polyp removed with endoscopic snare and submitted for pathology. 3.  Otherwise normal colon on direct and retroflexed views.  Recommendations: 1.  Clear liquid diet 2.  Contrast-enhanced CT scan of the chest, abdomen and pelvis "right colon mass, rule out metastases" 3.  Follow-up biopsies 4.  Consult general surgery  Discussed with the patient post procedure and the patient's wife (via telephone).  Wilhemina Bonito. Eda Keys., M.D. [facility] Division of Gastroenterology

## 2023-04-06 NOTE — Consult Note (Signed)
Consult Note  Chad Fisher 1933-03-25  [id number].    Requesting MD: [name], MD Chief Complaint/Reason for Consult: ascending colon mass  HPI:  Patient is an 89 year old male who presented to the ED with hematochezia. 6/23 had a BM after church with BRBPR and associated lightheadedness. He denied abdominal or rectal pain. He has not been having nausea or vomiting. He reported 1 other similar episode a few months ago with only small amount of bleeding. Patient has had bowel movements since being admitted and has not had any further bleeding. Reported 10 lb weight loss since March. Last colonoscopy was in 2008 and showed diverticulosis. He was seen by PCP in April 2024 and fecal occult positive, and he was scheduled to see GI for this tomorrow. Fecal occult was also positive on 02/19/23. PMH significant for skin cancer, HLD, and hypothyroidism. Prior abdominal surgery includes inguinal hernia repair in the 1980's. He is not on any blood  thinners. He denies tobacco, alcohol or illicit drug use. Denies a family hx of colon cancer.  His wife is at the bedside with him today.    ROS: Negative other than HPI  Family History  Problem Relation Age of Onset   Anemia Father    Benign prostatic hyperplasia Brother    Cancer Daughter    Breast cancer Daughter    Kidney cancer Son        doing ok    Past Medical History:  Diagnosis Date   Cancer (HCC)    skin   Hernia 1987   right inguinal     Past Surgical History:  Procedure Laterality Date   CATARACT EXTRACTION, BILATERAL     HERNIA REPAIR     MOHS SURGERY     left ear    Social History:  reports that he has never smoked. He has never used smokeless tobacco. He reports that he does not drink alcohol and does not use drugs.  Allergies: No Known Allergies  Medications Prior to Admission  Medication Sig Dispense Refill   acetaminophen (TYLENOL) 500 MG tablet Take 1,000 mg by mouth every 6 (six) hours as needed for  fever.     cyanocobalamin (VITAMIN B12) 1000 MCG/ML injection INJECT 1mL INTRAMUSCULARLY EVERY 30 DAYS (Patient taking differently: Inject 1,000 mcg into the muscle every 30 (thirty) days.) 10 mL 3   levothyroxine (SYNTHROID) 75 MCG tablet TAKE 1 TABLET BY MOUTH DAILY (Patient taking differently: Take 75 mcg by mouth daily before breakfast.) 100 tablet 2   Multiple Vitamins-Minerals (ICAPS AREDS 2 PO) Take 1 capsule by mouth 2 (two) times daily.     Potassium Citrate 15 MEQ (1620 MG) TBCR Take 1 tablet by mouth daily.     simvastatin (ZOCOR) 20 MG tablet TAKE 1 TABLET BY MOUTH DAILY (Patient taking differently: Take 20 mg by mouth every evening.) 100 tablet 2    Blood pressure 115/68, pulse 80, temperature (!) 97.5 F (36.4 C), temperature source Temporal, resp. rate 15, height 5\' 8"  (1.727 m), weight 64.4 kg, SpO2 (!) 10 %. Physical Exam:  General: pleasant, WD, white male who is up in the chair in NAD HEENT: head is normocephalic, atraumatic.  Sclera are noninjected.  PERRL.  Ears and nose without any masses or lesions.  Mouth is pink and moist Heart: regular, rate, and rhythm.  Normal s1,s2. No obvious murmurs, gallops, or rubs noted.  Palpable radial and pedal pulses bilaterally Lungs: CTAB, no wheezes, rhonchi, or rales  noted.  Respiratory effort nonlabored Abd: soft, NT, ND, +BS, no masses, hernias, or organomegaly MS: all 4 extremities are symmetrical with no cyanosis, clubbing, or edema. Skin: warm and dry with no masses, lesions, or rashes Neuro: Cranial nerves 2-12 grossly intact, sensation is normal throughout Psych: A&Ox3 with an appropriate affect.   Results for orders placed or performed during the hospital encounter of 04/04/23 (from the past 48 hour(s))  CBC with Differential     Status: Abnormal   Collection Time: 04/04/23  2:30 PM  Result Value Ref Range   WBC 5.9 4.0 - 10.5 K/uL   RBC 4.21 (L) 4.22 - 5.81 MIL/uL   Hemoglobin 11.0 (L) 13.0 - 17.0 g/dL   HCT 30.8 (L)  65.7 - 52.0 %   MCV 84.6 80.0 - 100.0 fL   MCH 26.1 26.0 - 34.0 pg   MCHC 30.9 30.0 - 36.0 g/dL   RDW 84.6 96.2 - 95.2 %   Platelets 194 150 - 400 K/uL   nRBC 0.0 0.0 - 0.2 %   Neutrophils Relative % 63 %   Neutro Abs 3.7 1.7 - 7.7 K/uL   Lymphocytes Relative 22 %   Lymphs Abs 1.3 0.7 - 4.0 K/uL   Monocytes Relative 11 %   Monocytes Absolute 0.7 0.1 - 1.0 K/uL   Eosinophils Relative 3 %   Eosinophils Absolute 0.2 0.0 - 0.5 K/uL   Basophils Relative 1 %   Basophils Absolute 0.0 0.0 - 0.1 K/uL   Immature Granulocytes 0 %   Abs Immature Granulocytes 0.01 0.00 - 0.07 K/uL    Comment: Performed at [facility], [address], [place], [place] [address]  Comprehensive metabolic panel     Status: Abnormal   Collection Time: 04/04/23  2:30 PM  Result Value Ref Range   Sodium 140 135 - 145 mmol/L   Potassium 4.1 3.5 - 5.1 mmol/L   Chloride 107 98 - 111 mmol/L   CO2 25 22 - 32 mmol/L   Glucose, Bld 127 (H) 70 - 99 mg/dL    Comment: Glucose reference range applies only to samples taken after fasting for at least 8 hours.   BUN 19 8 - 23 mg/dL   Creatinine, Ser 4.40 0.61 - 1.24 mg/dL   Calcium 8.5 (L) 8.9 - 10.3 mg/dL   Total Protein 6.9 6.5 - 8.1 g/dL   Albumin 3.4 (L) 3.5 - 5.0 g/dL   AST 25 15 - 41 U/L   ALT 23 0 - 44 U/L   Alkaline Phosphatase 108 38 - 126 U/L   Total Bilirubin 0.8 0.3 - 1.2 mg/dL   GFR, Estimated >10 >27 mL/min    Comment: (NOTE) Calculated using the CKD-EPI Creatinine Equation (2021)    Anion gap 8 5 - 15    Comment: Performed at [facility], [address], [place], [place] [address]  CBC     Status: Abnormal   Collection Time: 04/05/23  3:18 AM  Result Value Ref Range   WBC 6.6 4.0 - 10.5 K/uL   RBC 4.16 (L) 4.22 - 5.81 MIL/uL   Hemoglobin 10.9 (L) 13.0 - 17.0 g/dL   HCT 44.0 (L) 34.7 - 42.5 %   MCV 84.9 80.0 - 100.0 fL   MCH 26.2 26.0 - 34.0 pg   MCHC 30.9 30.0 - 36.0 g/dL   RDW 95.6 38.7 - 56.4 %   Platelets  188 150 - 400 K/uL   nRBC 0.0 0.0 - 0.2 %  Comment: Performed at [facility], [address], [place], [place] [address]  Iron and TIBC     Status: Abnormal   Collection Time: 04/05/23  3:18 AM  Result Value Ref Range   Iron 36 (L) 45 - 182 ug/dL   TIBC 540 981 - 191 ug/dL   Saturation Ratios 10 (L) 17.9 - 39.5 %   UIBC 342 ug/dL    Comment: Performed at [facility], [address], [place], [place] [address]  Protime-INR     Status: None   Collection Time: 04/05/23  3:18 AM  Result Value Ref Range   Prothrombin Time 14.4 11.4 - 15.2 seconds   INR 1.1 0.8 - 1.2    Comment: (NOTE) INR goal varies based on device and disease states. Performed at [facility], [address], [place], [place] [address]   CBC     Status: Abnormal   Collection Time: 04/06/23  3:28 AM  Result Value Ref Range   WBC 6.1 4.0 - 10.5 K/uL   RBC 4.45 4.22 - 5.81 MIL/uL   Hemoglobin 11.8 (L) 13.0 - 17.0 g/dL   HCT 08.6 (L) 57.8 - 46.9 %   MCV 85.4 80.0 - 100.0 fL   MCH 26.5 26.0 - 34.0 pg   MCHC 31.1 30.0 - 36.0 g/dL   RDW 62.9 52.8 - 41.3 %   Platelets 208 150 - 400 K/uL   nRBC 0.0 0.0 - 0.2 %    Comment: Performed at [facility], [address], [place], [place] [address]   No results found.    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 Hgb is stable and no further bleeding currently. Will follow results of CT and discuss plan of care with Dr. Bedelia Person, likely surgery this admission to prevent future bleeding from mass.   FEN: CLD VTE: SCDs ID: no current abx  HLD Hypothyroidism B12 deficiency/pernicious anemia  CKD III  I reviewed Consultant GI notes, hospitalist notes, last 24 h vitals and pain  scores, last 48 h intake and output, last 24 h labs and trends, and last 24 h imaging results.  This care required high  level of medical decision making.   Hosie Spangle, [facility] Surgery 04/06/2023, 2:20 PM Please see Amion for pager number during day hours 7:00am-4:30pm

## 2023-04-06 NOTE — Plan of Care (Signed)

## 2023-04-06 NOTE — Anesthesia Preprocedure Evaluation (Addendum)
Anesthesia Evaluation  Patient identified by MRN, date of birth, ID band Patient awake    Reviewed: Allergy & Precautions, NPO status , Patient's Chart, lab work & pertinent test results  Airway Mallampati: III  TM Distance: >3 FB Neck ROM: Full    Dental  (+) Teeth Intact, Dental Advisory Given   Pulmonary neg pulmonary ROS   Pulmonary exam normal breath sounds clear to auscultation       Cardiovascular + Peripheral Vascular Disease  Normal cardiovascular exam Rhythm:Regular Rate:Normal     Neuro/Psych negative neurological ROS  negative psych ROS   GI/Hepatic Neg liver ROS,,,Hematochezia    Endo/Other  Hypothyroidism    Renal/GU negative Renal ROS  negative genitourinary   Musculoskeletal negative musculoskeletal ROS (+)    Abdominal   Peds  Hematology  (+) Blood dyscrasia, anemia Hb 11.8   Anesthesia Other Findings   Reproductive/Obstetrics negative OB ROS                             Anesthesia Physical Anesthesia Plan  ASA: 2  Anesthesia Plan: MAC   Post-op Pain Management:    Induction:   PONV Risk Score and Plan: 2 and Propofol infusion and TIVA  Airway Management Planned: Natural Airway and Simple Face Mask  Additional Equipment: None  Intra-op Plan:   Post-operative Plan:   Informed Consent: I have reviewed the patients History and Physical, chart, labs and discussed the procedure including the risks, benefits and alternatives for the proposed anesthesia with the patient or authorized representative who has indicated his/her understanding and acceptance.       Plan Discussed with: CRNA  Anesthesia Plan Comments:        Anesthesia Quick Evaluation

## 2023-04-06 NOTE — Progress Notes (Signed)
  Progress Note   Patient: Chad Fisher GNF:[id number] DOB: 1933-07-10 DOA: 04/04/2023     1 DOS: the patient was seen and examined on 04/06/2023   Brief hospital course: 89 year old man with intermittent hematochezia for several months, due to see GI as an outpatient this week, who presented 6/23 with acute on chronic hematochezia, lightheadedness.  Admitted for GI bleed, initially thought likely to be diverticular in nature.  GI consulted.  Colonoscopy 6/25 revealed right colonic mass.  Consultants GI  Procedures 6/25 Colonoscopy with biopsies, submucosal injection, and snare polypectomy  Assessment and Plan: Hematochezia Mild normocytic anemia secondary to chronic GI blood loss Colon mass Hemoglobin stable.  Has not required blood products.  Colonoscopy revealed malignant mass of the right colon, possible cecum.   CT scan chest, abdomen pelvis planned, follow-up biopsies.  General surgery consulted.     Hypothyroidism Continue levothyroxine   Hyperlipidemia Continue statin     Subjective:  Feels fine  Physical Exam: Vitals:   04/06/23 1330 04/06/23 1340 04/06/23 1350 04/06/23 1430  BP: 107/62 111/60 115/68 134/74  Pulse: 90 78 80 78  Resp: (!) 21 12 15 17   Temp:    98 F (36.7 C)  TempSrc:    Oral  SpO2:    98%  Weight:      Height:       Physical Exam Vitals reviewed.  Constitutional:      General: He is not in acute distress.    Appearance: He is not ill-appearing or toxic-appearing.     Comments: Sitting in chair  Cardiovascular:     Rate and Rhythm: Normal rate and regular rhythm.     Heart sounds: No murmur heard. Pulmonary:     Effort: Pulmonary effort is normal. No respiratory distress.     Breath sounds: No wheezing, rhonchi or rales.  Neurological:     Mental Status: He is alert.  Psychiatric:        Mood and Affect: Mood normal.        Behavior: Behavior normal.     Data Reviewed: Hgb stable 11.8, WBC WNL, Plts WNL  Family Communication:  wife of 66 years, at bedside  Disposition: Status is: Inpatient Remains inpatient appropriate because: workup for colon cancer  Planned Discharge Destination: Home    Time spent: 20 minutes  Author: Brendia Sacks, MD 04/06/2023 3:27 PM  For on call review [email or web address].

## 2023-04-06 NOTE — Interval H&P Note (Signed)
History and Physical Interval Note:  04/06/2023 12:12 PM  Chad Fisher  has presented today for surgery, with the diagnosis of hematochezia, weight loss.  The various methods of treatment have been discussed with the patient and family. After consideration of risks, benefits and other options for treatment, the patient has consented to  Procedure(s): COLONOSCOPY (N/A) as a surgical intervention.  The patient's history has been reviewed, patient examined, no change in status, stable for surgery.  I have reviewed the patient's chart and labs.  Questions were answered to the patient's satisfaction.     [name]

## 2023-04-06 NOTE — Anesthesia Postprocedure Evaluation (Signed)
Anesthesia Post Note  Patient: Chad Fisher  Procedure(s) Performed: COLONOSCOPY BIOPSY POLYPECTOMY SUBMUCOSAL TATTOO INJECTION     Patient location during evaluation: PACU Anesthesia Type: MAC Level of consciousness: awake and alert Pain management: pain level controlled Vital Signs Assessment: post-procedure vital signs reviewed and stable Respiratory status: spontaneous breathing, nonlabored ventilation and respiratory function stable Cardiovascular status: blood pressure returned to baseline and stable Postop Assessment: no apparent nausea or vomiting Anesthetic complications: no   No notable events documented.  Last Vitals:  Vitals:   04/06/23 1330 04/06/23 1350  BP: 107/62 115/68  Pulse: 90 80  Resp: (!) 21 15  Temp:    SpO2:      Last Pain:  Vitals:   04/06/23 1350  TempSrc:   PainSc: 0-No pain                 [name]

## 2023-04-07 ENCOUNTER — Ambulatory Visit: Payer: Medicare Other | Admitting: Physician Assistant

## 2023-04-07 DIAGNOSIS — R935 Abnormal findings on diagnostic imaging of other abdominal regions, including retroperitoneum: Secondary | ICD-10-CM

## 2023-04-07 DIAGNOSIS — C182 Malignant neoplasm of ascending colon: Secondary | ICD-10-CM | POA: Diagnosis not present

## 2023-04-07 DIAGNOSIS — K625 Hemorrhage of anus and rectum: Secondary | ICD-10-CM | POA: Diagnosis not present

## 2023-04-07 DIAGNOSIS — D123 Benign neoplasm of transverse colon: Secondary | ICD-10-CM | POA: Diagnosis not present

## 2023-04-07 DIAGNOSIS — C189 Malignant neoplasm of colon, unspecified: Secondary | ICD-10-CM | POA: Diagnosis present

## 2023-04-07 LAB — FERRITIN: Ferritin: 27 ng/mL (ref 24–336)

## 2023-04-07 MED ORDER — CHLORHEXIDINE GLUCONATE CLOTH 2 % EX PADS
6.0000 | MEDICATED_PAD | Freq: Once | CUTANEOUS | Status: AC
  Administered 2023-04-07: 6 via TOPICAL

## 2023-04-07 MED ORDER — ENSURE PRE-SURGERY PO LIQD
296.0000 mL | Freq: Once | ORAL | Status: AC
  Administered 2023-04-08: 296 mL via ORAL
  Filled 2023-04-07: qty 296

## 2023-04-07 MED ORDER — CHLORHEXIDINE GLUCONATE CLOTH 2 % EX PADS
6.0000 | MEDICATED_PAD | Freq: Once | CUTANEOUS | Status: AC
  Administered 2023-04-08: 6 via TOPICAL

## 2023-04-07 NOTE — Progress Notes (Signed)
PROGRESS NOTE   SENDER RUEB  NFA:[id number]    DOB: 09/23/1933    DOA: 04/04/2023  PCP: Shelva Majestic, MD   I have briefly reviewed patients previous medical records in [facility].  Chief Complaint  Patient presents with   Rectal Bleeding    Brief Narrative:  89 year old married male, independent, PMH of HLD, hypothyroid, several months history of intermittent mild hematochezia, decreased appetite, progressive weight loss, seen by his PCP where FOBT was positive, referred to GI had upcoming appointment on 04/07/2023 but then developed large hematochezia with associated lightheadedness and presented to the ED.  Admitted for hematochezia secondary to large colon cancer mass (diagnosed by colonoscopy this admission).  [facility] general surgery consulting.   Assessment & Plan:  Principal Problem:   Colon cancer [facility]) Active Problems:   Hyperlipidemia   Hypothyroidism   Hematochezia   Mass of colon   Normocytic anemia   Colon cancer with rectal bleeding/cecal mass Colonoscopy 11/11/2006 by Dr. Victorino Dike was "normal" and he was told that he did not need any further colonoscopies. Colonoscopy 04/06/2023: Malignant mass of the right colon Right colon mass biopsy: Invasive moderately differentiated colonic adenocarcinoma.  CEA pending. CT C/A/P: Large cecal mass with involvement of ileocecal valve, terminal ileum, apparent extension to the proximal ascending colon with associated lymphadenopathy in the ileocolic, mesenteric, likely metastatic.  Pulmonary metastasis not excluded. General surgery follow-up appreciated and plan to meet with patient and spouse this afternoon with above results regarding further plans. If surgery is planned, patient has good activity tolerance, no cardiac or pulmonary history, asymptomatic of cardiorespiratory symptoms, may proceed with indicated surgery with usual close monitoring and precautions and no further cardiac workup.  Chronic blood  loss anemia/iron deficiency anemia Secondary to colon cancer. Ferritin 27, iron 36, saturation ratio 10. Hemoglobin stable in the 11 g range. Consider starting iron supplements pending surgery decision.  Hypothyroidism: Continue levothyroxine  Hyperlipidemia Continue statins.  Body mass index is 21.59 kg/m. Dietitian consulted.   ACP Documents: Living will appreciated. DVT prophylaxis: SCDs Start: 04/04/23 1647     Code Status: Full Code:  Family Communication: None at bedside. Disposition:  Status is: Inpatient Remains inpatient appropriate because: May need colon cancer surgery.     Consultants:   [facility] General surgery  Procedures:     Antimicrobials:    Pathology: FINAL MICROSCOPIC DIAGNOSIS:   A. COLON MASS, RIGHT, BIOPSY:  Invasive moderately differentiated colonic adenocarcinoma.   B. COLON, TRANSVERSE, POLYPECTOMY:  Tubular adenoma without high grade dysplasia.   Subjective:  Patient reports that since admission he has had minimal, intermittent blood in stools but nothing significant.  Denies abdominal pain.  States that he is physically quite active, takes care of his yard, drives and assists his wife with all doctors appointments etc.  No chest pain, dyspnea, palpitations, dizziness or lightheadedness.  Reports 10 to 15 pound weight loss, mostly in the last 1 month or so.  Decreased appetite.  Objective:   Vitals:   04/06/23 1430 04/06/23 2100 04/07/23 0603 04/07/23 1253  BP: 134/74 (!) 147/70 122/65 121/70  Pulse: 78 78 78 73  Resp: 17 14 18 15   Temp: 98 F (36.7 C) 98.7 F (37.1 C) 98.9 F (37.2 C) 97.8 F (36.6 C)  TempSrc: Oral Oral Oral   SpO2: 98% 97% 94% 99%  Weight:      Height:        General exam: Elderly male, moderately built and frail lying  comfortably propped up in bed without distress.  Oral mucosa pale but moist.   Respiratory system: Clear to auscultation. Respiratory effort normal. Cardiovascular system: S1 & S2  heard, RRR. No JVD, murmurs, rubs, gallops or clicks. No pedal edema. Gastrointestinal system: Abdomen is nondistended, soft and nontender. No organomegaly or masses felt. Normal bowel sounds heard. Central nervous system: Alert and oriented. No focal neurological deficits. Extremities: Symmetric 5 x 5 power. Skin: No rashes, lesions or ulcers Psychiatry: Judgement and insight appear normal. Mood & affect appropriate.     Data Reviewed:   I have personally reviewed following labs and imaging studies   CBC: Recent Labs  Lab 04/04/23 1430 04/05/23 0318 04/06/23 0328  WBC 5.9 6.6 6.1  NEUTROABS 3.7  --   --   HGB 11.0* 10.9* 11.8*  HCT 35.6* 35.3* 38.0*  MCV 84.6 84.9 85.4  PLT 194 188 208    Basic Metabolic Panel: Recent Labs  Lab 04/04/23 1430  NA 140  K 4.1  CL 107  CO2 25  GLUCOSE 127*  BUN 19  CREATININE 1.13  CALCIUM 8.5*    Liver Function Tests: Recent Labs  Lab 04/04/23 1430  AST 25  ALT 23  ALKPHOS 108  BILITOT 0.8  PROT 6.9  ALBUMIN 3.4*    CBG: No results for input(s): "GLUCAP" in the last 168 hours.  Microbiology Studies:  No results found for this or any previous visit (from the past 240 hour(s)).  Radiology Studies:  CT CHEST ABDOMEN PELVIS W CONTRAST  Result Date: 04/07/2023 CLINICAL DATA:  89 year old male with history of colonic mass. Evaluate for metastatic disease. * Tracking Code: BO * EXAM: CT CHEST, ABDOMEN, AND PELVIS WITH CONTRAST TECHNIQUE: Multidetector CT imaging of the chest, abdomen and pelvis was performed following the standard protocol during bolus administration of intravenous contrast. RADIATION DOSE REDUCTION: This exam was performed according to the departmental dose-optimization program which includes automated exposure control, adjustment of the mA and/or kV according to patient size and/or use of iterative reconstruction technique. CONTRAST:  OMNIPAQUE IOHEXOL 300 MG/ML  SOLN COMPARISON:  No prior chest CT. CT of  the abdomen and pelvis 01/24/2018. FINDINGS: CT CHEST FINDINGS Cardiovascular: Heart size is normal. There is no significant pericardial fluid, thickening or pericardial calcification. There is aortic atherosclerosis, as well as atherosclerosis of the great vessels of the mediastinum and the coronary arteries, including calcified atherosclerotic plaque in the left main, left anterior descending and right coronary arteries. Mediastinum/Nodes: No pathologically enlarged mediastinal or hilar lymph nodes. Esophagus is unremarkable in appearance. No axillary lymphadenopathy. Lungs/Pleura: Small calcified granuloma in the left lower lobe. 4 mm nodule in the inferior aspect of the lingula (axial image 96 of series 4), nonspecific, but new compared to prior CT of the abdomen and pelvis 11/14/2015 which covered this region. No other definite suspicious appearing pulmonary nodules or masses are noted. No acute consolidative airspace disease. No pleural effusions. Scattered areas of post infectious or inflammatory scarring are noted, most evident in the right lower lobe where there is some mild cylindrical bronchiectasis, thickening of the peribronchovascular interstitium and regional architectural distortion, likely from remote pneumonia. Musculoskeletal: There are no aggressive appearing lytic or blastic lesions noted in the visualized portions of the skeleton. CT ABDOMEN PELVIS FINDINGS Hepatobiliary: No suspicious cystic or solid hepatic lesions. No intra or extrahepatic biliary ductal dilatation. Gallbladder is unremarkable in appearance. Pancreas: No pancreatic mass. No pancreatic ductal dilatation. No pancreatic or peripancreatic fluid collections or inflammatory changes. Spleen:  Unremarkable. Adrenals/Urinary Tract: Multiple nonobstructive calculi are noted within the collecting systems of both kidneys (right greater than left) measuring up to 9 mm in the right renal collecting system. Low-attenuation lesions in both  kidneys compatible with simple cysts (Bosniak class 1, no imaging follow-up recommended), largest of which in the interpolar region of the right kidney measures 6.1 cm in diameter. No aggressive appearing renal lesions. No hydroureteronephrosis. Urinary bladder is normal in appearance. Bilateral adrenal glands are normal in appearance. Stomach/Bowel: The appearance of the stomach is normal. No pathologic dilatation of small bowel or colon. A short segment of the proximal sigmoid colon extends into a small left inguinal hernia. Severe mass-like thickening of the cecum is noted, with apparent involvement of the ileocecal junction including some extension to involve the terminal ileum, as well as more diffuse mass-like thickening of the proximal ascending colon. The epicenter of this mass-like thickening is centered around the ileocecal valve into the cecum, best appreciated on axial image 86 of series 2 and coronal image 55 of series 5 where this area appears to demonstrate increased enhancement and is estimated measure approximately 4.9 x 5.0 x 5.8 cm, although extensive mural thickening extends cephalad from this region into the proximal ascending colon, suggesting additional malignant involvement. Vascular/Lymphatic: Atherosclerosis in the abdominal aorta and pelvic vasculature, without evidence of aneurysm or dissection. Numerous prominent borderline enlarged and mildly enlarged ileocolic lymph nodes are noted, largest of which measures up to 1.4 cm (axial image 80 of series 2). Reproductive: Prostate gland and seminal vesicles are unremarkable in appearance. Other: No significant volume of ascites.  No pneumoperitoneum. Musculoskeletal: There are no aggressive appearing lytic or blastic lesions noted in the visualized portions of the skeleton. IMPRESSION: 1. Large mass centered in the region of the cecum with involvement of the ileocecal valve and the terminal ileum, as well as apparent extension to involve the  proximal ascending colon. The epicenter of this mass measures approximately 4.9 x 5.0 x 5.8 cm, although this likely underestimates the true size of the lesion given the cephalad extension. There is associated lymphadenopathy in the ileocolic mesentery, likely metastatic. 2. No other definite signs of metastatic disease noted elsewhere in the chest, abdomen or pelvis. 3. There is a new 4 mm nodule in the inferior aspect of the lingula, nonspecific. The possibility of a metastatic lesion is not excluded, but not strongly favored. Close attention on follow-up studies is recommended to ensure stability. 4. Small left inguinal hernia containing a short segment of the proximal sigmoid colon, without evidence of bowel incarceration or obstruction at this time. 5. 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 6. Aortic atherosclerosis, in addition to left main and 2 vessel coronary artery disease. 7. Additional incidental findings, as above. Electronically Signed   By: Trudie Reed M.D.   On: 04/07/2023 08:11    Scheduled Meds:    levothyroxine  75 mcg Oral QAC breakfast   simvastatin  20 mg Oral Daily    Continuous Infusions:    lactated ringers 75 mL/hr at 04/07/23 0336     LOS: 2 days     Marcellus Scott, MD,  FACP, [facility], [facility], [facility], [facility]   [facility] [place]     To contact the attending provider between 7A-7P or the covering provider during after hours 7P-7A, please log into the web site [email or web address] and access using universal [place] password  for that web site. If you do not have the password, please call the hospital operator.  04/07/2023, 1:46 PM

## 2023-04-07 NOTE — Progress Notes (Signed)
Path resulted reviewed with the patient. Plan for R hemicolectomy 6/27. Informed consent was obtained after detailed explanation of risks, including bleeding, infection, hematoma, anastomotic leak/dehiscence, injury to surrounding structures. All questions answered to the patient's satisfaction as well as his wife at bedside.  Diamantina Monks, MD General and Trauma Surgery [facility]

## 2023-04-07 NOTE — Progress Notes (Signed)
Progress Note  1 Day Post-Op  Subjective: Pt denies abdominal pain, n/v. He continues to have bowel function and doesn't think he has had any further bleeding although there may have been a few drops of bloody BM on the floor when getting to the bathroom. His wife is coming in around 12:30-1 PM today and he would like to have a discussion with her and MD present.   Objective: Vital signs in last 24 hours: Temp:  [97.5 F (36.4 C)-98.9 F (37.2 C)] 98.9 F (37.2 C) (06/26 0603) Pulse Rate:  [67-90] 78 (06/26 0603) Resp:  [12-21] 18 (06/26 0603) BP: (92-151)/(43-74) 122/65 (06/26 0603) SpO2:  [10 %-99 %] 94 % (06/26 0603) Last BM Date : 04/07/23  Intake/Output from previous day: 06/25 0701 - 06/26 0700 In: 2007.2 [P.O.:240; I.V.:1767.2] Out: -  Intake/Output this shift: No intake/output data recorded.  PE: General: pleasant, WD, elderly male who is laying in bed in NAD HEENT: EOMI Heart: regular, rate, and rhythm.  Lungs:  Respiratory effort nonlabored Abd: soft, NT, ND, no palpable masses MS: all 4 extremities are symmetrical with no cyanosis, clubbing, or edema. Skin: warm and dry with no masses, lesions, or rashes Psych: A&Ox3 with an appropriate affect.    Lab Results:  Recent Labs    04/05/23 0318 04/06/23 0328  WBC 6.6 6.1  HGB 10.9* 11.8*  HCT 35.3* 38.0*  PLT 188 208   BMET Recent Labs    04/04/23 1430  NA 140  K 4.1  CL 107  CO2 25  GLUCOSE 127*  BUN 19  CREATININE 1.13  CALCIUM 8.5*   PT/INR Recent Labs    04/05/23 0318  LABPROT 14.4  INR 1.1   CMP     Component Value Date/Time   NA 140 04/04/2023 1430   K 4.1 04/04/2023 1430   CL 107 04/04/2023 1430   CO2 25 04/04/2023 1430   GLUCOSE 127 (H) 04/04/2023 1430   BUN 19 04/04/2023 1430   CREATININE 1.13 04/04/2023 1430   CALCIUM 8.5 (L) 04/04/2023 1430   PROT 6.9 04/04/2023 1430   ALBUMIN 3.4 (L) 04/04/2023 1430   AST 25 04/04/2023 1430   ALT 23 04/04/2023 1430   ALKPHOS 108  04/04/2023 1430   BILITOT 0.8 04/04/2023 1430   GFRNONAA >60 04/04/2023 1430   GFRAA (L) 12/10/2010 2210    57        The eGFR has been calculated using the MDRD equation. This calculation has not been validated in all clinical situations. eGFR's persistently <60 mL/min signify possible Chronic Kidney Disease.   Lipase  No results found for: "LIPASE"     Studies/Results: CT CHEST ABDOMEN PELVIS W CONTRAST  Result Date: 04/07/2023 CLINICAL DATA:  89 year old male with history of colonic mass. Evaluate for metastatic disease. * Tracking Code: BO * EXAM: CT CHEST, ABDOMEN, AND PELVIS WITH CONTRAST TECHNIQUE: Multidetector CT imaging of the chest, abdomen and pelvis was performed following the standard protocol during bolus administration of intravenous contrast. RADIATION DOSE REDUCTION: This exam was performed according to the departmental dose-optimization program which includes automated exposure control, adjustment of the mA and/or kV according to patient size and/or use of iterative reconstruction technique. CONTRAST:  OMNIPAQUE IOHEXOL 300 MG/ML  SOLN COMPARISON:  No prior chest CT. CT of the abdomen and pelvis 01/24/2018. FINDINGS: CT CHEST FINDINGS Cardiovascular: Heart size is normal. There is no significant pericardial fluid, thickening or pericardial calcification. There is aortic atherosclerosis, as well as atherosclerosis of the  great vessels of the mediastinum and the coronary arteries, including calcified atherosclerotic plaque in the left main, left anterior descending and right coronary arteries. Mediastinum/Nodes: No pathologically enlarged mediastinal or hilar lymph nodes. Esophagus is unremarkable in appearance. No axillary lymphadenopathy. Lungs/Pleura: Small calcified granuloma in the left lower lobe. 4 mm nodule in the inferior aspect of the lingula (axial image 96 of series 4), nonspecific, but new compared to prior CT of the abdomen and pelvis 11/14/2015 which  covered this region. No other definite suspicious appearing pulmonary nodules or masses are noted. No acute consolidative airspace disease. No pleural effusions. Scattered areas of post infectious or inflammatory scarring are noted, most evident in the right lower lobe where there is some mild cylindrical bronchiectasis, thickening of the peribronchovascular interstitium and regional architectural distortion, likely from remote pneumonia. Musculoskeletal: There are no aggressive appearing lytic or blastic lesions noted in the visualized portions of the skeleton. CT ABDOMEN PELVIS FINDINGS Hepatobiliary: No suspicious cystic or solid hepatic lesions. No intra or extrahepatic biliary ductal dilatation. Gallbladder is unremarkable in appearance. Pancreas: No pancreatic mass. No pancreatic ductal dilatation. No pancreatic or peripancreatic fluid collections or inflammatory changes. Spleen: Unremarkable. Adrenals/Urinary Tract: Multiple nonobstructive calculi are noted within the collecting systems of both kidneys (right greater than left) measuring up to 9 mm in the right renal collecting system. Low-attenuation lesions in both kidneys compatible with simple cysts (Bosniak class 1, no imaging follow-up recommended), largest of which in the interpolar region of the right kidney measures 6.1 cm in diameter. No aggressive appearing renal lesions. No hydroureteronephrosis. Urinary bladder is normal in appearance. Bilateral adrenal glands are normal in appearance. Stomach/Bowel: The appearance of the stomach is normal. No pathologic dilatation of small bowel or colon. A short segment of the proximal sigmoid colon extends into a small left inguinal hernia. Severe mass-like thickening of the cecum is noted, with apparent involvement of the ileocecal junction including some extension to involve the terminal ileum, as well as more diffuse mass-like thickening of the proximal ascending colon. The epicenter of this mass-like  thickening is centered around the ileocecal valve into the cecum, best appreciated on axial image 86 of series 2 and coronal image 55 of series 5 where this area appears to demonstrate increased enhancement and is estimated measure approximately 4.9 x 5.0 x 5.8 cm, although extensive mural thickening extends cephalad from this region into the proximal ascending colon, suggesting additional malignant involvement. Vascular/Lymphatic: Atherosclerosis in the abdominal aorta and pelvic vasculature, without evidence of aneurysm or dissection. Numerous prominent borderline enlarged and mildly enlarged ileocolic lymph nodes are noted, largest of which measures up to 1.4 cm (axial image 80 of series 2). Reproductive: Prostate gland and seminal vesicles are unremarkable in appearance. Other: No significant volume of ascites.  No pneumoperitoneum. Musculoskeletal: There are no aggressive appearing lytic or blastic lesions noted in the visualized portions of the skeleton. IMPRESSION: 1. Large mass centered in the region of the cecum with involvement of the ileocecal valve and the terminal ileum, as well as apparent extension to involve the proximal ascending colon. The epicenter of this mass measures approximately 4.9 x 5.0 x 5.8 cm, although this likely underestimates the true size of the lesion given the cephalad extension. There is associated lymphadenopathy in the ileocolic mesentery, likely metastatic. 2. No other definite signs of metastatic disease noted elsewhere in the chest, abdomen or pelvis. 3. There is a new 4 mm nodule in the inferior aspect of the lingula, nonspecific. The possibility of a  metastatic lesion is not excluded, but not strongly favored. Close attention on follow-up studies is recommended to ensure stability. 4. Small left inguinal hernia containing a short segment of the proximal sigmoid colon, without evidence of bowel incarceration or obstruction at this time. 5. Nonobstructive nephrolithiasis in  the collecting systems of both kidneys (right-greater-than-left) measuring up to 9 mm in the right renal collecting system. 6. Aortic atherosclerosis, in addition to left main and 2 vessel coronary artery disease. 7. Additional incidental findings, as above. Electronically Signed   By: Trudie Reed M.D.   On: 04/07/2023 08:11    Anti-infectives: Anti-infectives (From admission, onward)    None        Assessment/Plan  Ascending colon mass - s/p colonoscopy 6/25 with likely malignant ascending colon mass, transverse colon polyp (small, removed) - CT CAP for staging - some lymphadenopathy in ileocolic mesentery but no other definite signs of metastatic disease, 4 mm nodule in inferior aspect of lingula that is not strongly suggestive of metastasis but should be followed  - CEA and pathology pending  - hgb not checked this AM but was 11.8 yesterday, no definite BRBPR - clinically not obstructed, but patient currently remains prepped - not unreasonable to proceed with partial colectomy this admission. His wife will be here this afternoon and they would like to discuss all the options together    FEN: CLD VTE: SCDs ID: no current abx   HLD Hypothyroidism B12 deficiency/pernicious anemia  CKD III   LOS: 2 days   I reviewed Consultant GI notes, hospitalist notes, last 24 h vitals and pain scores, last 48 h intake and output, last 24 h labs and trends, and last 24 h imaging results.   Juliet Rude, [facility] Surgery 04/07/2023, 8:39 AM Please see Amion for pager number during day hours 7:00am-4:30pm

## 2023-04-07 NOTE — Progress Notes (Addendum)
Progress Note  Primary GI: Dr. Marina Goodell  LOS: 2 days   Chief Complaint: Colon mass   Subjective   Patient states he is doing well today.  Denies nausea, vomiting.  Denies abdominal pain.  Had a bowel movement with "a few drips of blood "but much improved from previous days.  General surgery is coming to talk with patient and wife today entheses afternoon) about consideration of plan for hemicolectomy and other options   Objective   Vital signs in last 24 hours: Temp:  [97.5 F (36.4 C)-98.9 F (37.2 C)] 98.9 F (37.2 C) (06/26 0603) Pulse Rate:  [67-90] 78 (06/26 0603) Resp:  [12-21] 18 (06/26 0603) BP: (92-151)/(43-74) 122/65 (06/26 0603) SpO2:  [10 %-99 %] 94 % (06/26 0603) Last BM Date : 04/07/23 Last BM recorded by nurses in past 5 days Stool Type: Type 7 (Liquid consistency with no solid pieces) (04/07/2023  6:00 AM)  General:   male in no acute distress  Heart:  Regular rate and rhythm; no murmurs Pulm: Clear anteriorly; no wheezing Abdomen: soft, nondistended, normal bowel sounds in all quadrants. Nontender without guarding. No organomegaly appreciated. Extremities:  No edema Neurologic:  Alert and  oriented x4;  No focal deficits.  Psych:  Cooperative. Normal mood and affect.  Intake/Output from previous day: 06/25 0701 - 06/26 0700 In: 2007.2 [P.O.:240; I.V.:1767.2] Out: -  Intake/Output this shift: No intake/output data recorded.  Studies/Results: CT CHEST ABDOMEN PELVIS W CONTRAST  Result Date: 04/07/2023 CLINICAL DATA:  89 year old male with history of colonic mass. Evaluate for metastatic disease. * Tracking Code: BO * EXAM: CT CHEST, ABDOMEN, AND PELVIS WITH CONTRAST TECHNIQUE: Multidetector CT imaging of the chest, abdomen and pelvis was performed following the standard protocol during bolus administration of intravenous contrast. RADIATION DOSE REDUCTION: This exam was performed according to the departmental dose-optimization program which includes  automated exposure control, adjustment of the mA and/or kV according to patient size and/or use of iterative reconstruction technique. CONTRAST:  OMNIPAQUE IOHEXOL 300 MG/ML  SOLN COMPARISON:  No prior chest CT. CT of the abdomen and pelvis 01/24/2018. FINDINGS: CT CHEST FINDINGS Cardiovascular: Heart size is normal. There is no significant pericardial fluid, thickening or pericardial calcification. There is aortic atherosclerosis, as well as atherosclerosis of the great vessels of the mediastinum and the coronary arteries, including calcified atherosclerotic plaque in the left main, left anterior descending and right coronary arteries. Mediastinum/Nodes: No pathologically enlarged mediastinal or hilar lymph nodes. Esophagus is unremarkable in appearance. No axillary lymphadenopathy. Lungs/Pleura: Small calcified granuloma in the left lower lobe. 4 mm nodule in the inferior aspect of the lingula (axial image 96 of series 4), nonspecific, but new compared to prior CT of the abdomen and pelvis 11/14/2015 which covered this region. No other definite suspicious appearing pulmonary nodules or masses are noted. No acute consolidative airspace disease. No pleural effusions. Scattered areas of post infectious or inflammatory scarring are noted, most evident in the right lower lobe where there is some mild cylindrical bronchiectasis, thickening of the peribronchovascular interstitium and regional architectural distortion, likely from remote pneumonia. Musculoskeletal: There are no aggressive appearing lytic or blastic lesions noted in the visualized portions of the skeleton. CT ABDOMEN PELVIS FINDINGS Hepatobiliary: No suspicious cystic or solid hepatic lesions. No intra or extrahepatic biliary ductal dilatation. Gallbladder is unremarkable in appearance. Pancreas: No pancreatic mass. No pancreatic ductal dilatation. No pancreatic or peripancreatic fluid collections or inflammatory changes. Spleen: Unremarkable.  Adrenals/Urinary Tract: Multiple nonobstructive calculi are noted  within the collecting systems of both kidneys (right greater than left) measuring up to 9 mm in the right renal collecting system. Low-attenuation lesions in both kidneys compatible with simple cysts (Bosniak class 1, no imaging follow-up recommended), largest of which in the interpolar region of the right kidney measures 6.1 cm in diameter. No aggressive appearing renal lesions. No hydroureteronephrosis. Urinary bladder is normal in appearance. Bilateral adrenal glands are normal in appearance. Stomach/Bowel: The appearance of the stomach is normal. No pathologic dilatation of small bowel or colon. A short segment of the proximal sigmoid colon extends into a small left inguinal hernia. Severe mass-like thickening of the cecum is noted, with apparent involvement of the ileocecal junction including some extension to involve the terminal ileum, as well as more diffuse mass-like thickening of the proximal ascending colon. The epicenter of this mass-like thickening is centered around the ileocecal valve into the cecum, best appreciated on axial image 86 of series 2 and coronal image 55 of series 5 where this area appears to demonstrate increased enhancement and is estimated measure approximately 4.9 x 5.0 x 5.8 cm, although extensive mural thickening extends cephalad from this region into the proximal ascending colon, suggesting additional malignant involvement. Vascular/Lymphatic: Atherosclerosis in the abdominal aorta and pelvic vasculature, without evidence of aneurysm or dissection. Numerous prominent borderline enlarged and mildly enlarged ileocolic lymph nodes are noted, largest of which measures up to 1.4 cm (axial image 80 of series 2). Reproductive: Prostate gland and seminal vesicles are unremarkable in appearance. Other: No significant volume of ascites.  No pneumoperitoneum. Musculoskeletal: There are no aggressive appearing lytic or blastic  lesions noted in the visualized portions of the skeleton. IMPRESSION: 1. Large mass centered in the region of the cecum with involvement of the ileocecal valve and the terminal ileum, as well as apparent extension to involve the proximal ascending colon. The epicenter of this mass measures approximately 4.9 x 5.0 x 5.8 cm, although this likely underestimates the true size of the lesion given the cephalad extension. There is associated lymphadenopathy in the ileocolic mesentery, likely metastatic. 2. No other definite signs of metastatic disease noted elsewhere in the chest, abdomen or pelvis. 3. There is a new 4 mm nodule in the inferior aspect of the lingula, nonspecific. The possibility of a metastatic lesion is not excluded, but not strongly favored. Close attention on follow-up studies is recommended to ensure stability. 4. Small left inguinal hernia containing a short segment of the proximal sigmoid colon, without evidence of bowel incarceration or obstruction at this time. 5. 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 6. Aortic atherosclerosis, in addition to left main and 2 vessel coronary artery disease. 7. Additional incidental findings, as above. Electronically Signed   By: Trudie Reed M.D.   On: 04/07/2023 08:11    Lab Results: Recent Labs    04/04/23 1430 04/05/23 0318 04/06/23 0328  WBC 5.9 6.6 6.1  HGB 11.0* 10.9* 11.8*  HCT 35.6* 35.3* 38.0*  PLT 194 188 208   BMET Recent Labs    04/04/23 1430  NA 140  K 4.1  CL 107  CO2 25  GLUCOSE 127*  BUN 19  CREATININE 1.13  CALCIUM 8.5*   LFT Recent Labs    04/04/23 1430  PROT 6.9  ALBUMIN 3.4*  AST 25  ALT 23  ALKPHOS 108  BILITOT 0.8   PT/INR Recent Labs    04/05/23 0318  LABPROT 14.4  INR 1.1     Scheduled Meds:  levothyroxine  75 mcg Oral QAC breakfast   simvastatin  20 mg Oral Daily    Continuous Infusions:  lactated ringers 75 mL/hr at 04/07/23 7829      Patient profile:   Chad Fisher is a 89 y.o. male with past medical history significant for hypothyroidism, hyperlipidemia, presents for evaluation of hematochezia.    Impression:   Ascending colon mass -Colonoscopy 6/25 with likely malignant appearing ascending colon mass, transverse colon polyps.  Pathology pending. -CEA pending -CT chest abdomen pelvis for staging: Lymphadenopathy and ileocolic mesentery, 4 mm nodule in the inferior aspect of lingula not strongly suggestive of metastasis, no other definitive signs of metastatic disease -Hgb yesterday 11.8 Bleeding is improving at this time.  Stable hemoglobin.   Plan:   -Appreciate general surgery conditions.  Planning for meeting with patient and wife this afternoon for consideration of right hemicolectomy. -Awaiting colonoscopy pathology results -CEA pending -Continue supportive care  Bayley Leanna Sato  04/07/2023, 10:01 AM  GI ATTENDING  Interval history data reviewed.  Patient personally seen and examined.  Wife in room.  Agree with interval progress note as outlined above.  The pathology shows adenocarcinoma.  The polyp removed was benign.  CT scan identifies the mass as cecal.  Appears to be local lymphadenopathy.  Otherwise, no evidence for metastatic disease.  I discussed this with the patient and his wife.  I also spoke with the general surgeon, Dr. Bedelia Person, who plans surgery tomorrow.  We are available if needed.  Thanks.  Wilhemina Bonito. Eda Keys., M.D. [facility] Division of Gastroenterology

## 2023-04-08 ENCOUNTER — Other Ambulatory Visit: Payer: Self-pay

## 2023-04-08 ENCOUNTER — Inpatient Hospital Stay (HOSPITAL_COMMUNITY): Payer: Medicare Other | Admitting: Certified Registered Nurse Anesthetist

## 2023-04-08 ENCOUNTER — Encounter (HOSPITAL_COMMUNITY): Admission: EM | Disposition: A | Discharge status: Home / Self Care | Payer: Self-pay | Source: Home / Self Care

## 2023-04-08 ENCOUNTER — Encounter (HOSPITAL_COMMUNITY): Payer: Self-pay | Admitting: Internal Medicine

## 2023-04-08 DIAGNOSIS — D630 Anemia in neoplastic disease: Secondary | ICD-10-CM

## 2023-04-08 DIAGNOSIS — E039 Hypothyroidism, unspecified: Secondary | ICD-10-CM

## 2023-04-08 DIAGNOSIS — C180 Malignant neoplasm of cecum: Secondary | ICD-10-CM

## 2023-04-08 DIAGNOSIS — D638 Anemia in other chronic diseases classified elsewhere: Secondary | ICD-10-CM

## 2023-04-08 DIAGNOSIS — I739 Peripheral vascular disease, unspecified: Secondary | ICD-10-CM

## 2023-04-08 DIAGNOSIS — C182 Malignant neoplasm of ascending colon: Secondary | ICD-10-CM | POA: Diagnosis not present

## 2023-04-08 HISTORY — PX: LAPAROTOMY: SHX154

## 2023-04-08 LAB — CBC
HCT: 34 % — ABNORMAL LOW (ref 39.0–52.0)
Hemoglobin: 10.5 g/dL — ABNORMAL LOW (ref 13.0–17.0)
MCH: 26.3 pg (ref 26.0–34.0)
MCHC: 30.9 g/dL (ref 30.0–36.0)
MCV: 85 fL (ref 80.0–100.0)
Platelets: 223 10*3/uL (ref 150–400)
RBC: 4 MIL/uL — ABNORMAL LOW (ref 4.22–5.81)
RDW: 14.9 % (ref 11.5–15.5)
WBC: 12.4 10*3/uL — ABNORMAL HIGH (ref 4.0–10.5)
nRBC: 0 % (ref 0.0–0.2)

## 2023-04-08 LAB — SURGICAL PCR SCREEN
MRSA, PCR: NEGATIVE
Staphylococcus aureus: NEGATIVE

## 2023-04-08 LAB — CEA: CEA: 2.1 ng/mL (ref 0.0–4.7)

## 2023-04-08 SURGERY — LAPAROTOMY, EXPLORATORY
Anesthesia: General

## 2023-04-08 MED ORDER — CHLORHEXIDINE GLUCONATE 0.12 % MT SOLN
15.0000 mL | Freq: Once | OROMUCOSAL | Status: AC
  Administered 2023-04-08: 15 mL via OROMUCOSAL

## 2023-04-08 MED ORDER — ONDANSETRON HCL 4 MG/2ML IJ SOLN
INTRAMUSCULAR | Status: DC | PRN
  Administered 2023-04-08: 4 mg via INTRAVENOUS

## 2023-04-08 MED ORDER — ROCURONIUM BROMIDE 10 MG/ML (PF) SYRINGE
PREFILLED_SYRINGE | INTRAVENOUS | Status: DC | PRN
  Administered 2023-04-08: 20 mg via INTRAVENOUS
  Administered 2023-04-08: 50 mg via INTRAVENOUS

## 2023-04-08 MED ORDER — SUCCINYLCHOLINE CHLORIDE 200 MG/10ML IV SOSY
PREFILLED_SYRINGE | INTRAVENOUS | Status: AC
  Filled 2023-04-08: qty 10

## 2023-04-08 MED ORDER — ARTIFICIAL TEARS OPHTHALMIC OINT
TOPICAL_OINTMENT | OPHTHALMIC | Status: AC
  Filled 2023-04-08: qty 3.5

## 2023-04-08 MED ORDER — ALBUMIN HUMAN 5 % IV SOLN
INTRAVENOUS | Status: AC
  Filled 2023-04-08: qty 250

## 2023-04-08 MED ORDER — FENTANYL CITRATE (PF) 100 MCG/2ML IJ SOLN
INTRAMUSCULAR | Status: AC
  Filled 2023-04-08: qty 2

## 2023-04-08 MED ORDER — KETOROLAC TROMETHAMINE 30 MG/ML IJ SOLN
30.0000 mg | Freq: Four times a day (QID) | INTRAMUSCULAR | Status: AC
  Administered 2023-04-08 – 2023-04-09 (×4): 30 mg via INTRAVENOUS
  Filled 2023-04-08 (×4): qty 1

## 2023-04-08 MED ORDER — ENSURE PRE-SURGERY PO LIQD
592.0000 mL | Freq: Once | ORAL | Status: AC
  Administered 2023-04-08: 592 mL via ORAL
  Filled 2023-04-08: qty 592

## 2023-04-08 MED ORDER — ALVIMOPAN 12 MG PO CAPS
12.0000 mg | ORAL_CAPSULE | ORAL | Status: AC
  Administered 2023-04-08: 12 mg via ORAL
  Filled 2023-04-08: qty 1

## 2023-04-08 MED ORDER — HYDROMORPHONE HCL 1 MG/ML IJ SOLN
INTRAMUSCULAR | Status: AC
  Filled 2023-04-08: qty 1

## 2023-04-08 MED ORDER — SODIUM CHLORIDE (PF) 0.9 % IJ SOLN
INTRAMUSCULAR | Status: AC
  Filled 2023-04-08: qty 10

## 2023-04-08 MED ORDER — PROMETHAZINE HCL 25 MG/ML IJ SOLN: 6.2500 mg | INTRAMUSCULAR | Status: DC | PRN

## 2023-04-08 MED ORDER — DEXAMETHASONE SODIUM PHOSPHATE 10 MG/ML IJ SOLN
INTRAMUSCULAR | Status: AC
  Filled 2023-04-08: qty 1

## 2023-04-08 MED ORDER — SODIUM CHLORIDE 0.9 % IR SOLN
Status: DC | PRN
  Administered 2023-04-08: 1000 mL
  Administered 2023-04-08: 2000 mL

## 2023-04-08 MED ORDER — HYDROMORPHONE HCL 1 MG/ML IJ SOLN
0.2500 mg | INTRAMUSCULAR | Status: DC | PRN
  Administered 2023-04-08: 0.5 mg via INTRAVENOUS

## 2023-04-08 MED ORDER — ACETAMINOPHEN 10 MG/ML IV SOLN: 1000.0000 mg | Freq: Four times a day (QID) | INTRAVENOUS | Status: DC

## 2023-04-08 MED ORDER — OXYCODONE HCL 5 MG PO TABS: 5.0000 mg | ORAL_TABLET | Freq: Once | ORAL | Status: DC | PRN

## 2023-04-08 MED ORDER — AMISULPRIDE (ANTIEMETIC) 5 MG/2ML IV SOLN: 10.0000 mg | Freq: Once | INTRAVENOUS | Status: DC | PRN

## 2023-04-08 MED ORDER — PROPOFOL 10 MG/ML IV BOLUS
INTRAVENOUS | Status: AC
  Filled 2023-04-08: qty 20

## 2023-04-08 MED ORDER — HYDROMORPHONE HCL 1 MG/ML IJ SOLN
INTRAMUSCULAR | Status: DC | PRN
  Administered 2023-04-08 (×3): .2 mg via INTRAVENOUS

## 2023-04-08 MED ORDER — ONDANSETRON HCL 4 MG/2ML IJ SOLN
INTRAMUSCULAR | Status: AC
  Filled 2023-04-08: qty 2

## 2023-04-08 MED ORDER — OXYCODONE HCL 5 MG PO TABS
2.5000 mg | ORAL_TABLET | ORAL | Status: DC | PRN
  Administered 2023-04-09: 2.5 mg via ORAL
  Administered 2023-04-09: 5 mg via ORAL
  Filled 2023-04-08 (×2): qty 1

## 2023-04-08 MED ORDER — HEPARIN SODIUM (PORCINE) 5000 UNIT/ML IJ SOLN
5000.0000 [IU] | Freq: Once | INTRAMUSCULAR | Status: AC
  Administered 2023-04-08: 5000 [IU] via SUBCUTANEOUS
  Filled 2023-04-08: qty 1

## 2023-04-08 MED ORDER — NEOMYCIN SULFATE 500 MG PO TABS
1000.0000 mg | ORAL_TABLET | ORAL | Status: DC
  Administered 2023-04-08 (×2): 1000 mg via ORAL
  Filled 2023-04-08 (×3): qty 2

## 2023-04-08 MED ORDER — LACTATED RINGERS IV SOLN: INTRAVENOUS | Status: AC

## 2023-04-08 MED ORDER — OXYCODONE HCL 5 MG/5ML PO SOLN: 5.0000 mg | Freq: Once | ORAL | Status: DC | PRN

## 2023-04-08 MED ORDER — PHENYLEPHRINE HCL-NACL 20-0.9 MG/250ML-% IV SOLN
INTRAVENOUS | Status: DC | PRN
  Administered 2023-04-08: 35 ug/min via INTRAVENOUS

## 2023-04-08 MED ORDER — SODIUM CHLORIDE 0.9 % IV SOLN
2.0000 g | INTRAVENOUS | Status: AC
  Administered 2023-04-08: 2 g via INTRAVENOUS
  Filled 2023-04-08: qty 2

## 2023-04-08 MED ORDER — DEXAMETHASONE SODIUM PHOSPHATE 10 MG/ML IJ SOLN
INTRAMUSCULAR | Status: DC | PRN
  Administered 2023-04-08: 5 mg via INTRAVENOUS

## 2023-04-08 MED ORDER — ACETAMINOPHEN 10 MG/ML IV SOLN
1000.0000 mg | Freq: Four times a day (QID) | INTRAVENOUS | Status: AC
  Administered 2023-04-08 – 2023-04-09 (×3): 1000 mg via INTRAVENOUS
  Filled 2023-04-08 (×3): qty 100

## 2023-04-08 MED ORDER — METHOCARBAMOL 1000 MG/10ML IJ SOLN
1000.0000 mg | Freq: Three times a day (TID) | INTRAVENOUS | Status: AC
  Administered 2023-04-08 (×2): 1000 mg via INTRAVENOUS
  Filled 2023-04-08 (×2): qty 1000

## 2023-04-08 MED ORDER — ALVIMOPAN 12 MG PO CAPS
12.0000 mg | ORAL_CAPSULE | Freq: Two times a day (BID) | ORAL | Status: DC
  Administered 2023-04-09 (×2): 12 mg via ORAL
  Filled 2023-04-08 (×3): qty 1

## 2023-04-08 MED ORDER — FENTANYL CITRATE (PF) 100 MCG/2ML IJ SOLN
INTRAMUSCULAR | Status: DC | PRN
  Administered 2023-04-08: 50 ug via INTRAVENOUS
  Administered 2023-04-08: 100 ug via INTRAVENOUS
  Administered 2023-04-08: 50 ug via INTRAVENOUS

## 2023-04-08 MED ORDER — SUGAMMADEX SODIUM 200 MG/2ML IV SOLN
INTRAVENOUS | Status: DC | PRN
  Administered 2023-04-08: 200 mg via INTRAVENOUS

## 2023-04-08 MED ORDER — ROCURONIUM BROMIDE 10 MG/ML (PF) SYRINGE
PREFILLED_SYRINGE | INTRAVENOUS | Status: AC
  Filled 2023-04-08: qty 10

## 2023-04-08 MED ORDER — PROPOFOL 10 MG/ML IV BOLUS
INTRAVENOUS | Status: DC | PRN
  Administered 2023-04-08: 110 mg via INTRAVENOUS

## 2023-04-08 MED ORDER — LIDOCAINE 2% (20 MG/ML) 5 ML SYRINGE
INTRAMUSCULAR | Status: DC | PRN
  Administered 2023-04-08: 60 mg via INTRAVENOUS

## 2023-04-08 MED ORDER — HYDROMORPHONE HCL 2 MG/ML IJ SOLN
INTRAMUSCULAR | Status: AC
  Filled 2023-04-08: qty 1

## 2023-04-08 MED ORDER — SUCCINYLCHOLINE CHLORIDE 200 MG/10ML IV SOSY
PREFILLED_SYRINGE | INTRAVENOUS | Status: DC | PRN
  Administered 2023-04-08: 120 mg via INTRAVENOUS

## 2023-04-08 MED ORDER — EPHEDRINE 5 MG/ML INJ
INTRAVENOUS | Status: AC
  Filled 2023-04-08: qty 5

## 2023-04-08 MED ORDER — ACETAMINOPHEN 10 MG/ML IV SOLN
1000.0000 mg | Freq: Four times a day (QID) | INTRAVENOUS | Status: DC
  Administered 2023-04-08: 1000 mg via INTRAVENOUS
  Filled 2023-04-08 (×2): qty 100

## 2023-04-08 MED ORDER — ALBUMIN HUMAN 5 % IV SOLN: INTRAVENOUS | Status: DC | PRN

## 2023-04-08 MED ORDER — EPHEDRINE SULFATE-NACL 50-0.9 MG/10ML-% IV SOSY
PREFILLED_SYRINGE | INTRAVENOUS | Status: DC | PRN
  Administered 2023-04-08: 10 mg via INTRAVENOUS
  Administered 2023-04-08: 5 mg via INTRAVENOUS

## 2023-04-08 MED ORDER — LIDOCAINE HCL (PF) 2 % IJ SOLN
INTRAMUSCULAR | Status: AC
  Filled 2023-04-08: qty 5

## 2023-04-08 MED ORDER — METRONIDAZOLE 500 MG PO TABS
1000.0000 mg | ORAL_TABLET | ORAL | Status: DC
  Administered 2023-04-08 (×2): 1000 mg via ORAL
  Filled 2023-04-08 (×2): qty 2

## 2023-04-08 SURGICAL SUPPLY — 48 items
APL PRP STRL LF DISP 70% ISPRP (MISCELLANEOUS)
APL SWBSTK 6 STRL LF DISP (MISCELLANEOUS)
APPLICATOR COTTON TIP 6 STRL (MISCELLANEOUS) ×2 IMPLANT
APPLICATOR COTTON TIP 6IN STRL (MISCELLANEOUS) IMPLANT
BAG COUNTER SPONGE SURGICOUNT (BAG) IMPLANT
BAG SPNG CNTER NS LX DISP (BAG)
BLADE EXTENDED COATED 6.5IN (ELECTRODE) IMPLANT
BLADE HEX COATED 2.75 (ELECTRODE) ×2 IMPLANT
CHLORAPREP W/TINT 26 (MISCELLANEOUS) ×2 IMPLANT
COVER MAYO STAND STRL (DRAPES) ×2 IMPLANT
COVER SURGICAL LIGHT HANDLE (MISCELLANEOUS) ×2 IMPLANT
DRAPE LAPAROSCOPIC ABDOMINAL (DRAPES) ×2 IMPLANT
DRAPE WARM FLUID 44X44 (DRAPES) IMPLANT
DRSG OPSITE POSTOP 4X10 (GAUZE/BANDAGES/DRESSINGS) IMPLANT
ELECT PENCIL ROCKER SW 15FT (MISCELLANEOUS) IMPLANT
ELECT REM PT RETURN 15FT ADLT (MISCELLANEOUS) ×2 IMPLANT
GAUZE SPONGE 4X4 12PLY STRL (GAUZE/BANDAGES/DRESSINGS) ×2 IMPLANT
GLOVE BIO SURGEON STRL SZ 6.5 (GLOVE) ×2 IMPLANT
GLOVE BIOGEL PI IND STRL 6 (GLOVE) ×2 IMPLANT
GOWN STRL REUS W/ TWL LRG LVL3 (GOWN DISPOSABLE) ×2 IMPLANT
GOWN STRL REUS W/ TWL XL LVL3 (GOWN DISPOSABLE) IMPLANT
GOWN STRL REUS W/TWL LRG LVL3 (GOWN DISPOSABLE)
GOWN STRL REUS W/TWL XL LVL3 (GOWN DISPOSABLE) ×4
HANDLE SUCTION POOLE (INSTRUMENTS) IMPLANT
KIT BASIN OR (CUSTOM PROCEDURE TRAY) ×2 IMPLANT
KIT TURNOVER KIT A (KITS) IMPLANT
LIGASURE IMPACT 36 18CM CVD LR (INSTRUMENTS) IMPLANT
NS IRRIG 1000ML POUR BTL (IV SOLUTION) ×2 IMPLANT
PACK GENERAL/GYN (CUSTOM PROCEDURE TRAY) ×2 IMPLANT
RELOAD PROXIMATE 75MM BLUE (ENDOMECHANICALS) ×3 IMPLANT
RELOAD STAPLE 75 3.8 BLU REG (ENDOMECHANICALS) IMPLANT
STAPLER PROXIMATE 75MM BLUE (STAPLE) IMPLANT
STAPLER VISISTAT 35W (STAPLE) ×2 IMPLANT
SUCTION POOLE HANDLE (INSTRUMENTS)
SUT NOV 1 T60/GS (SUTURE) IMPLANT
SUT PDS AB 1 TP1 96 (SUTURE) IMPLANT
SUT SILK 2 0 (SUTURE) ×1
SUT SILK 2 0 SH CR/8 (SUTURE) IMPLANT
SUT SILK 2-0 18XBRD TIE 12 (SUTURE) IMPLANT
SUT SILK 3 0 (SUTURE)
SUT SILK 3 0 SH CR/8 (SUTURE) IMPLANT
SUT SILK 3-0 18XBRD TIE 12 (SUTURE) IMPLANT
SUT VICRYL 2 0 18 UND BR (SUTURE) IMPLANT
TOWEL OR 17X26 10 PK STRL BLUE (TOWEL DISPOSABLE) IMPLANT
TRAY FOLEY MTR SLVR 16FR STAT (SET/KITS/TRAYS/PACK) IMPLANT
TUBING CONNECTING 10 (TUBING) IMPLANT
WATER STERILE IRR 1000ML POUR (IV SOLUTION) ×2 IMPLANT
YANKAUER SUCT BULB TIP NO VENT (SUCTIONS) IMPLANT

## 2023-04-08 NOTE — Anesthesia Postprocedure Evaluation (Signed)
Anesthesia Post Note  Patient: Chad Fisher  Procedure(s) Performed: EXPLORATORY LAPAROTOMY WITH RIGHT HEMICOLECTOMY     Patient location during evaluation: PACU Anesthesia Type: General Level of consciousness: awake and alert Pain management: pain level controlled Vital Signs Assessment: post-procedure vital signs reviewed and stable Respiratory status: spontaneous breathing, nonlabored ventilation and respiratory function stable Cardiovascular status: blood pressure returned to baseline and stable Postop Assessment: no apparent nausea or vomiting Anesthetic complications: no   No notable events documented.  Last Vitals:  Vitals:   04/08/23 1200 04/08/23 1215  BP: (!) 182/88 (!) 172/83  Pulse: 78 67  Resp: (!) 9 15  Temp:  36.6 C  SpO2: 98% 98%    Last Pain:  Vitals:   04/08/23 1215  TempSrc:   PainSc: Asleep                 Lowella Curb

## 2023-04-08 NOTE — Anesthesia Procedure Notes (Signed)
Procedure Name: Intubation Date/Time: 04/08/2023 9:08 AM  Performed by: Orest Dikes, CRNAPre-anesthesia Checklist: Patient identified, Emergency Drugs available, Suction available and Patient being monitored Patient Re-evaluated:Patient Re-evaluated prior to induction Oxygen Delivery Method: Circle system utilized Preoxygenation: Pre-oxygenation with 100% oxygen Induction Type: IV induction Ventilation: Mask ventilation without difficulty Laryngoscope Size: Mac and 4 Grade View: Grade I Tube type: Oral Tube size: 7.0 mm Number of attempts: 1 Airway Equipment and Method: Stylet Placement Confirmation: ETT inserted through vocal cords under direct vision, positive ETCO2 and breath sounds checked- equal and bilateral Secured at: 21 cm Tube secured with: Tape Dental Injury: Teeth and Oropharynx as per pre-operative assessment

## 2023-04-08 NOTE — Anesthesia Preprocedure Evaluation (Signed)
Anesthesia Evaluation  Patient identified by MRN, date of birth, ID band Patient awake    Reviewed: Allergy & Precautions, H&P , NPO status , Patient's Chart, lab work & pertinent test results  Airway Mallampati: III  TM Distance: >3 FB Neck ROM: Full    Dental  (+) Teeth Intact, Dental Advisory Given   Pulmonary neg pulmonary ROS   Pulmonary exam normal breath sounds clear to auscultation       Cardiovascular + Peripheral Vascular Disease  Normal cardiovascular exam Rhythm:Regular Rate:Normal     Neuro/Psych negative neurological ROS  negative psych ROS   GI/Hepatic negative GI ROS, Neg liver ROS,,,Hematochezia    Endo/Other  Hypothyroidism    Renal/GU Renal InsufficiencyRenal diseasenegative Renal ROS  negative genitourinary   Musculoskeletal negative musculoskeletal ROS (+)    Abdominal   Peds negative pediatric ROS (+)  Hematology  (+) Blood dyscrasia, anemia Hb 11.8   Anesthesia Other Findings Colon Cancer  Reproductive/Obstetrics negative OB ROS                             Anesthesia Physical Anesthesia Plan  ASA: 3  Anesthesia Plan: General   Post-op Pain Management: Dilaudid IV   Induction: Intravenous  PONV Risk Score and Plan: 2 and Ondansetron, Midazolam and Treatment may vary due to age or medical condition  Airway Management Planned: Oral ETT  Additional Equipment: None  Intra-op Plan:   Post-operative Plan: Extubation in OR  Informed Consent: I have reviewed the patients History and Physical, chart, labs and discussed the procedure including the risks, benefits and alternatives for the proposed anesthesia with the patient or authorized representative who has indicated his/her understanding and acceptance.       Plan Discussed with: CRNA  Anesthesia Plan Comments:        Anesthesia Quick Evaluation

## 2023-04-08 NOTE — Plan of Care (Signed)
  Problem: Education: Goal: Knowledge of General Education information will improve Description: Including pain rating scale, medication(s)/side effects and non-pharmacologic comfort measures Outcome: Progressing   Problem: Activity: Goal: Risk for activity intolerance will decrease Outcome: Progressing   Problem: Coping: Goal: Level of anxiety will decrease Outcome: Progressing   

## 2023-04-08 NOTE — Plan of Care (Signed)
  Problem: Education: Goal: Knowledge of General Education information will improve Description: Including pain rating scale, medication(s)/side effects and non-pharmacologic comfort measures Outcome: Progressing   Problem: Pain Managment: Goal: General experience of comfort will improve Outcome: Progressing   Problem: Safety: Goal: Ability to remain free from injury will improve Outcome: Progressing   

## 2023-04-08 NOTE — Transfer of Care (Signed)
Immediate Anesthesia Transfer of Care Note  Patient: Chad Fisher  Procedure(s) Performed: EXPLORATORY LAPAROTOMY WITH RIGHT HEMICOLECTOMY  Patient Location: PACU  Anesthesia Type:General  Level of Consciousness: Drowsy  Airway & Oxygen Therapy: Patient Spontanous Breathing and Patient connected to face mask oxygen  Post-op Assessment: Report given to RN and Post -op Vital signs reviewed and stable  Post vital signs: Reviewed and stable  Last Vitals:  Vitals Value Taken Time  BP 180/78 04/08/23 1112  Temp    Pulse 73 04/08/23 1113  Resp 17 04/08/23 1113  SpO2 100 % 04/08/23 1113  Vitals shown include unvalidated device data.  Last Pain:  Vitals:   04/08/23 0815  TempSrc:   PainSc: 0-No pain      Patients Stated Pain Goal: 0 (04/07/23 1720)  Complications: No notable events documented.

## 2023-04-08 NOTE — Progress Notes (Signed)
PROGRESS NOTE   AUTHOR HATLESTAD  XBJ:[id number]    DOB: 1933-04-24    DOA: 04/04/2023  PCP: Shelva Majestic, MD   I have briefly reviewed patients previous medical records in [facility].  Chief Complaint  Patient presents with   Rectal Bleeding    Brief Narrative:  89 year old married male, independent, PMH of HLD, hypothyroid, several months history of intermittent mild hematochezia, decreased appetite, progressive weight loss, seen by his PCP where FOBT was positive, referred to GI had upcoming appointment on 04/07/2023 but then developed large hematochezia with associated lightheadedness and presented to the ED.  Admitted for hematochezia secondary to large colon cancer mass (diagnosed by colonoscopy this admission).  [facility] general surgery consulted.  S/p exploratory laparotomy, right hemicolectomy 6/27.  Consulted medical oncology/Dr. Al Pimple who will formally consult pending final pathology results from today surgery as well.  As discussed with Dr. Bedelia Person on 6/27, minimal medical problems and now issues only pertaining to colon cancer, postop status and further management.  She has currently agreed to take over patient's care onto CCS service from 6/28 and [facility] will sign off.   Assessment & Plan:  Principal Problem:   Colon cancer [facility]) Active Problems:   Hyperlipidemia   Hypothyroidism   Hematochezia   Mass of colon   Normocytic anemia   Abnormal CT of the abdomen   Colon cancer with rectal bleeding/cecal mass Colonoscopy 11/11/2006 by Dr. Victorino Dike was "normal" and he was told that he did not need any further colonoscopies. Colonoscopy 04/06/2023: Malignant mass of the right colon Right colon mass biopsy: Invasive moderately differentiated colonic adenocarcinoma.  CEA pending. CT C/A/P: Large cecal mass with involvement of ileocecal valve, terminal ileum, apparent extension to the proximal ascending colon with associated lymphadenopathy in the ileocolic,  mesenteric, likely metastatic.  Pulmonary metastasis not excluded. General surgery consultation and follow-up much appreciated.  Communicated with Dr. Bedelia Person, CCS on 6/27 >  Underwent exploratory laparotomy and right hemicolectomy.  Minimal blood loss.  Had some bulky mesenteric lymphadenopathy concerning for nodal metastasis but will have to wait for pathology. I consulted Dr. Al Pimple, Medical Oncology, patient is on her list and she will see the patient after all final pathologies are back. Rest of management as per general surgery and oncology.  TR[facility] will sign off today.  CCS resumes full care from 6/28.  Chronic blood loss anemia/iron deficiency anemia Secondary to colon cancer. Ferritin 27, iron 36, saturation ratio 10. Hemoglobin stable in the 11 g range. Consider starting iron supplements pending surgery decision.  Hypothyroidism: Continue levothyroxine  Hyperlipidemia Continue statins.  Body mass index is 21.59 kg/m. Dietitian consulted.   ACP Documents: Living will appreciated. DVT prophylaxis: SCDs Start: 04/04/23 1647     Code Status: Full Code:  Family Communication: None at bedside. Disposition:  Immediate postop     Consultants:   [facility] General surgery  Procedures:     Antimicrobials:    Pathology: FINAL MICROSCOPIC DIAGNOSIS:   A. COLON MASS, RIGHT, BIOPSY:  Invasive moderately differentiated colonic adenocarcinoma.   B. COLON, TRANSVERSE, POLYPECTOMY:  Tubular adenoma without high grade dysplasia.   Subjective:  Seen as soon as he returned from PACU to his hospital bed.  Feels weak but no other complaints reported.  Objective:   Vitals:   04/08/23 1145 04/08/23 1200 04/08/23 1215 04/08/23 1235  BP: (!) 184/88 (!) 182/88 (!) 172/83 (!) 168/87  Pulse: (!) 53 78 67 86  Resp: 14 (!) 9 15  15  Temp:   97.8 F (36.6 C) (!) 97.5 F (36.4 C)  TempSrc:    Axillary  SpO2: 98% 98% 98% 98%  Weight:      Height:        General exam: Elderly  male, moderately built and frail lying comfortably propped up in bed without distress.  Oral mucosa pale but moist.   Respiratory system: Clear to auscultation.  No increased work of breathing. Cardiovascular system: S1 & S2 heard, RRR. No JVD, murmurs, rubs, gallops or clicks. No pedal edema. Gastrointestinal system: Laparotomy site dressing clean and dry.  Abdomen is nondistended, soft and nontender. No organomegaly or masses felt. Normal bowel sounds heard. Central nervous system: Alert and oriented. No focal neurological deficits. Extremities: Symmetric 5 x 5 power. Skin: No rashes, lesions or ulcers Psychiatry: Judgement and insight appear normal. Mood & affect appropriate.     Data Reviewed:   I have personally reviewed following labs and imaging studies   CBC: Recent Labs  Lab 04/04/23 1430 04/05/23 0318 04/06/23 0328 04/08/23 1145  WBC 5.9 6.6 6.1 12.4*  NEUTROABS 3.7  --   --   --   HGB 11.0* 10.9* 11.8* 10.5*  HCT 35.6* 35.3* 38.0* 34.0*  MCV 84.6 84.9 85.4 85.0  PLT 194 188 208 223    Basic Metabolic Panel: Recent Labs  Lab 04/04/23 1430  NA 140  K 4.1  CL 107  CO2 25  GLUCOSE 127*  BUN 19  CREATININE 1.13  CALCIUM 8.5*    Liver Function Tests: Recent Labs  Lab 04/04/23 1430  AST 25  ALT 23  ALKPHOS 108  BILITOT 0.8  PROT 6.9  ALBUMIN 3.4*    CBG: No results for input(s): "GLUCAP" in the last 168 hours.  Microbiology Studies:   Recent Results (from the past 240 hour(s))  Surgical PCR screen     Status: None   Collection Time: 04/08/23  4:28 AM   Specimen: Nasal Mucosa; Nasal Swab  Result Value Ref Range Status   MRSA, PCR NEGATIVE NEGATIVE Final   Staphylococcus aureus NEGATIVE NEGATIVE Final    Comment: (NOTE) The Xpert SA Assay (FDA approved for NASAL specimens in patients 87 years of age and older), is one component of a comprehensive surveillance program. It is not intended to diagnose infection nor to guide or monitor  treatment. Performed at [facility], [address], [place], [place] [address]     Radiology Studies:  CT CHEST ABDOMEN PELVIS W CONTRAST  Result Date: 04/07/2023 CLINICAL DATA:  89 year old male with history of colonic mass. Evaluate for metastatic disease. * Tracking Code: BO * EXAM: CT CHEST, ABDOMEN, AND PELVIS WITH CONTRAST TECHNIQUE: Multidetector CT imaging of the chest, abdomen and pelvis was performed following the standard protocol during bolus administration of intravenous contrast. RADIATION DOSE REDUCTION: This exam was performed according to the departmental dose-optimization program which includes automated exposure control, adjustment of the mA and/or kV according to patient size and/or use of iterative reconstruction technique. CONTRAST:  OMNIPAQUE IOHEXOL 300 MG/ML  SOLN COMPARISON:  No prior chest CT. CT of the abdomen and pelvis 01/24/2018. FINDINGS: CT CHEST FINDINGS Cardiovascular: Heart size is normal. There is no significant pericardial fluid, thickening or pericardial calcification. There is aortic atherosclerosis, as well as atherosclerosis of the great vessels of the mediastinum and the coronary arteries, including calcified atherosclerotic plaque in the left main, left anterior descending and right coronary arteries. Mediastinum/Nodes: No pathologically enlarged mediastinal or hilar  lymph nodes. Esophagus is unremarkable in appearance. No axillary lymphadenopathy. Lungs/Pleura: Small calcified granuloma in the left lower lobe. 4 mm nodule in the inferior aspect of the lingula (axial image 96 of series 4), nonspecific, but new compared to prior CT of the abdomen and pelvis 11/14/2015 which covered this region. No other definite suspicious appearing pulmonary nodules or masses are noted. No acute consolidative airspace disease. No pleural effusions. Scattered areas of post infectious or inflammatory scarring are noted, most evident in the right lower lobe  where there is some mild cylindrical bronchiectasis, thickening of the peribronchovascular interstitium and regional architectural distortion, likely from remote pneumonia. Musculoskeletal: There are no aggressive appearing lytic or blastic lesions noted in the visualized portions of the skeleton. CT ABDOMEN PELVIS FINDINGS Hepatobiliary: No suspicious cystic or solid hepatic lesions. No intra or extrahepatic biliary ductal dilatation. Gallbladder is unremarkable in appearance. Pancreas: No pancreatic mass. No pancreatic ductal dilatation. No pancreatic or peripancreatic fluid collections or inflammatory changes. Spleen: Unremarkable. Adrenals/Urinary Tract: Multiple nonobstructive calculi are noted within the collecting systems of both kidneys (right greater than left) measuring up to 9 mm in the right renal collecting system. Low-attenuation lesions in both kidneys compatible with simple cysts (Bosniak class 1, no imaging follow-up recommended), largest of which in the interpolar region of the right kidney measures 6.1 cm in diameter. No aggressive appearing renal lesions. No hydroureteronephrosis. Urinary bladder is normal in appearance. Bilateral adrenal glands are normal in appearance. Stomach/Bowel: The appearance of the stomach is normal. No pathologic dilatation of small bowel or colon. A short segment of the proximal sigmoid colon extends into a small left inguinal hernia. Severe mass-like thickening of the cecum is noted, with apparent involvement of the ileocecal junction including some extension to involve the terminal ileum, as well as more diffuse mass-like thickening of the proximal ascending colon. The epicenter of this mass-like thickening is centered around the ileocecal valve into the cecum, best appreciated on axial image 86 of series 2 and coronal image 55 of series 5 where this area appears to demonstrate increased enhancement and is estimated measure approximately 4.9 x 5.0 x 5.8 cm, although  extensive mural thickening extends cephalad from this region into the proximal ascending colon, suggesting additional malignant involvement. Vascular/Lymphatic: Atherosclerosis in the abdominal aorta and pelvic vasculature, without evidence of aneurysm or dissection. Numerous prominent borderline enlarged and mildly enlarged ileocolic lymph nodes are noted, largest of which measures up to 1.4 cm (axial image 80 of series 2). Reproductive: Prostate gland and seminal vesicles are unremarkable in appearance. Other: No significant volume of ascites.  No pneumoperitoneum. Musculoskeletal: There are no aggressive appearing lytic or blastic lesions noted in the visualized portions of the skeleton. IMPRESSION: 1. Large mass centered in the region of the cecum with involvement of the ileocecal valve and the terminal ileum, as well as apparent extension to involve the proximal ascending colon. The epicenter of this mass measures approximately 4.9 x 5.0 x 5.8 cm, although this likely underestimates the true size of the lesion given the cephalad extension. There is associated lymphadenopathy in the ileocolic mesentery, likely metastatic. 2. No other definite signs of metastatic disease noted elsewhere in the chest, abdomen or pelvis. 3. There is a new 4 mm nodule in the inferior aspect of the lingula, nonspecific. The possibility of a metastatic lesion is not excluded, but not strongly favored. Close attention on follow-up studies is recommended to ensure stability. 4. Small left inguinal hernia containing a short segment of the proximal  sigmoid colon, without evidence of bowel incarceration or obstruction at this time. 5. 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 6. Aortic atherosclerosis, in addition to left main and 2 vessel coronary artery disease. 7. Additional incidental findings, as above. Electronically  Signed   By: Trudie Reed M.D.   On: 04/07/2023 08:11    Scheduled Meds:    [START ON 04/09/2023] alvimopan  12 mg Oral BID   HYDROmorphone       ketorolac  30 mg Intravenous Q6H   levothyroxine  75 mcg Oral QAC breakfast   simvastatin  20 mg Oral Daily    Continuous Infusions:    acetaminophen     lactated ringers 75 mL/hr at 04/08/23 0858   methocarbamol (ROBAXIN) IV       LOS: 3 days     Marcellus Scott, MD,  FACP, [facility], [facility], [facility], [facility]   [facility] [place]     To contact the attending provider between 7A-7P or the covering provider during after hours 7P-7A, please log into the web site [email or web address] and access using universal [address] password for that web site. If you do not have the password, please call the hospital operator.  04/08/2023, 12:45 PM

## 2023-04-08 NOTE — Plan of Care (Signed)

## 2023-04-08 NOTE — Progress Notes (Signed)
   General Surgery Follow Up Note  Subjective:    Overnight Issues:   Objective:  Vital signs for last 24 hours: Temp:  [97.8 F (36.6 C)-98.4 F (36.9 C)] 98 F (36.7 C) (06/27 0810) Pulse Rate:  [67-74] 74 (06/27 0810) Resp:  [15-20] 19 (06/27 0810) BP: (108-128)/(55-75) 128/71 (06/27 0810) SpO2:  [95 %-99 %] 95 % (06/27 0810) Weight:  [64.4 kg] 64.4 kg (06/27 0815)  Hemodynamic parameters for last 24 hours:    Intake/Output from previous day: 06/26 0701 - 06/27 0700 In: 2444 [P.O.:652; I.V.:1792] Out: -   Intake/Output this shift: No intake/output data recorded.  Vent settings for last 24 hours:    Physical Exam:  Gen: comfortable, no distress Neuro: follows commands, alert, communicative HEENT: PERRL Neck: supple CV: RRR Pulm: unlabored breathing on RA Abd: soft, NT    GU: urine clear and yellow, +spontaneous void Extr: wwp, no edema  Results for orders placed or performed during the hospital encounter of 04/04/23 (from the past 24 hour(s))  Surgical PCR screen     Status: None   Collection Time: 04/08/23  4:28 AM   Specimen: Nasal Mucosa; Nasal Swab  Result Value Ref Range   MRSA, PCR NEGATIVE NEGATIVE   Staphylococcus aureus NEGATIVE NEGATIVE    Assessment & Plan:  Present on Admission:  Hematochezia  Hypothyroidism  Hyperlipidemia  Colon cancer (HCC)    LOS: 3 days   Additional comments:I reviewed the patient's new clinical lab test results.   and I reviewed the patients new imaging test results.    Ascending colon mass, biopsy confirmed adenocarcinoma - s/p colonoscopy 6/25, biopsy confirmed malignant ascending colon mass, transverse colon tubular adenoma  - CT CAP completed for staging - anticipate some lymphadenopathy in ileocolic mesentery, 4 mm nodule in inferior aspect of lingula that is not strongly suggestive of metastasis but should be followed  - CEA and CBC pending  - plan R hemicolectomy this AM. Informed consent was obtained  6/26 from the patient. He has no further questions this morning.   FEN: CLD VTE: SCDs ID: no current abx   HLD Hypothyroidism B12 deficiency/pernicious anemia  CKD III   Diamantina Monks, MD Trauma & General Surgery Please use [email or web address] to contact on call provider  04/08/2023  *Care during the described time interval was provided by me. I have reviewed this patient's available data, including medical history, events of note, physical examination and test results as part of my evaluation.

## 2023-04-08 NOTE — Op Note (Addendum)
   Operative Note   Date: 04/08/2023  Procedure: exploratory laparotomy, right hemicolectomy  Pre-op diagnosis: ascending colon malignancy Post-op diagnosis: cecal malignancy, bulky mesenteric lymphadenopathy  Indication and clinical history: The patient is a 89 y.o. year old male with a biopsy-confirmed ascending colon malignancy  Surgeon: Diamantina Monks, MD Assistant: Michaell Cowing, MD  Anesthesiologist: Hyacinth Meeker, MD Anesthesia: General  Findings:  Specimen: right colon EBL: <25cc Drains/Implants: none  Disposition: PACU - hemodynamically stable.  Description of procedure: The patient was positioned supine on the operating room table. General anesthetic induction and intubation were uneventful. Foley catheter insertion was performed and was atraumatic. Time-out was performed verifying correct patient, procedure, signature of informed consent, and administration of pre-operative antibiotics. The abdomen was prepped and draped in the usual sterile fashion.  A midline incision was made and deepened through the fascia. The peritoneal reflection was taken down on the right and the colon mobilized to the level of the transverse colon taking care to protect the duodenum. The terminal ileum was transected with a ble load of the GIA stapler. The omentum was taken down off the the transverse colon and the transverse colon was divided with a blue load of the GIA stapler. Vascular control of the mesenteric vessels was obtained with the ligasure device and all palpable bulky mesenteric lymphadenopathy was included in the specimen. The specimen was sent to pathology. A stapled anastomosis was created between the terminal ileum and the transverse colon. The staple line was oversewn and the lumen was palpated and confirmed to be widely patent. The abdomen was copiously irrigated and hemostasis confirmed. Colorectal changeover occurred. The midline incision was closed with #1 looped PDS suture and the skin  closed with staples.   Sterile dressings were applied. All sponge and instrument counts were correct at the conclusion of the procedure. The patient was awakened from anesthesia, extubated uneventfully, and transported to the PACU in good condition. There were no complications.    Diamantina Monks, MD General and Trauma Surgery [facility] Surgery

## 2023-04-09 ENCOUNTER — Encounter (HOSPITAL_COMMUNITY): Payer: Self-pay | Admitting: Surgery

## 2023-04-09 DIAGNOSIS — C189 Malignant neoplasm of colon, unspecified: Secondary | ICD-10-CM | POA: Diagnosis not present

## 2023-04-09 DIAGNOSIS — E440 Moderate protein-calorie malnutrition: Secondary | ICD-10-CM | POA: Insufficient documentation

## 2023-04-09 LAB — CBC
HCT: 30.2 % — ABNORMAL LOW (ref 39.0–52.0)
Hemoglobin: 9.4 g/dL — ABNORMAL LOW (ref 13.0–17.0)
MCH: 26.1 pg (ref 26.0–34.0)
MCHC: 31.1 g/dL (ref 30.0–36.0)
MCV: 83.9 fL (ref 80.0–100.0)
Platelets: 201 K/uL (ref 150–400)
RBC: 3.6 MIL/uL — ABNORMAL LOW (ref 4.22–5.81)
RDW: 15 % (ref 11.5–15.5)
WBC: 10.4 K/uL (ref 4.0–10.5)
nRBC: 0 % (ref 0.0–0.2)

## 2023-04-09 LAB — BASIC METABOLIC PANEL
Anion gap: 7 (ref 5–15)
BUN: 9 mg/dL (ref 8–23)
CO2: 26 mmol/L (ref 22–32)
Calcium: 8 mg/dL — ABNORMAL LOW (ref 8.9–10.3)
Chloride: 104 mmol/L (ref 98–111)
Creatinine, Ser: 1.01 mg/dL (ref 0.61–1.24)
GFR, Estimated: >60 mL/min (ref >60)
Glucose, Bld: 160 mg/dL — ABNORMAL HIGH (ref 70–99)
Potassium: 3.8 mmol/L (ref 3.5–5.1)
Sodium: 137 mmol/L (ref 135–145)

## 2023-04-09 LAB — HEMOGLOBIN A1C
Hgb A1c MFr Bld: 6.3 % — ABNORMAL HIGH (ref 4.8–5.6)
Mean Plasma Glucose: 134 mg/dL

## 2023-04-09 MED ORDER — ADULT MULTIVITAMIN W/MINERALS CH
1.0000 | ORAL_TABLET | Freq: Every day | ORAL | Status: DC
  Administered 2023-04-10: 1 via ORAL
  Filled 2023-04-09: qty 1

## 2023-04-09 MED ORDER — ENSURE ENLIVE PO LIQD
237.0000 mL | Freq: Two times a day (BID) | ORAL | Status: DC
  Administered 2023-04-09 – 2023-04-10 (×3): 237 mL via ORAL

## 2023-04-09 NOTE — Consult Note (Signed)
[facility]  Telephone:([phone] Fax:([phone]) [phone]   MEDICAL ONCOLOGY - INITIAL CONSULTATION  Referral MD  Reason for Referral: New diagnosis of adenocarcinoma cecum  Chief Complaint  Patient presents with   Rectal Bleeding   HPI:   This is a very pleasant 89 year old male patient who initially presented to the ER with chief complaint of hematochezia.  He had 1 other episode of hematochezia many months ago prior to this presentation.  Besides this he denies any change in bowel habits.  He may have felt tired and may have lost about 10 pounds unintentionally in the past 3 to 4 months. During his hospitalization he had CT chest abdomen pelvis which showed large mass centered in the region of the cecum with involvement of ileocecal valve and the terminal ileum as well as apparent extension to involve the proximal ascending colon.  The epicenter of the mass measured approximately 4.9 x 5 x 5.8 cm although this likely underestimates the true size of the lesion given the cephalad extension.  There is also associated lymphadenopathy in the ileocolic mesentery which is likely metastatic.  There is a new 4 mm nodule in the inferior aspect of lingula which is nonspecific.  Metastatic disease is not excluded.  He also had a colonoscopy which confirmed these findings, malignant mass of the right colon status postbiopsy.  Small transverse colon polyp removed with endoscopic mirror and submitted for pathology. This mass showed invasive moderately differentiated adenocarcinoma and the transverse colon polyp was determined as tubular adenoma without high-grade dysplasia.  He underwent exploratory laparotomy and right hemicolectomy on 04/08/2023 and op note dictates concern for bulky mesenteric lymphadenopathy which was also sent to pathology. I was asked to see him while he was inpatient to establish and to make follow-up outpatient. He is an excellent 89 year old at baseline, he is his  wife's caregiver.  He is very active, takes care of his own lawns, can go up flight of stairs, walk in the grocery store without any pauses.  He is a never smoker, no family history of colon cancer.  He is retired from the [employer], worked in Landscape architect.  Rest of the pertinent 10 point ROS reviewed and negative  Past Medical History:  Diagnosis Date   Cancer [facility])    skin   Hernia 1987   right inguinal   :   Past Surgical History:  Procedure Laterality Date   BIOPSY  04/06/2023   Procedure: BIOPSY;  Surgeon: Hilarie Fredrickson, MD;  Location: [facility];  Service: Gastroenterology;;   CATARACT EXTRACTION, BILATERAL     COLONOSCOPY N/A 04/06/2023   Procedure: COLONOSCOPY;  Surgeon: Hilarie Fredrickson, MD;  Location: [facility];  Service: Gastroenterology;  Laterality: N/A;   HERNIA REPAIR     LAPAROTOMY N/A 04/08/2023   Procedure: EXPLORATORY LAPAROTOMY WITH RIGHT HEMICOLECTOMY;  Surgeon: Diamantina Monks, MD;  Location: [facility];  Service: General;  Laterality: N/A;   MOHS SURGERY     left ear   POLYPECTOMY  04/06/2023   Procedure: POLYPECTOMY;  Surgeon: Hilarie Fredrickson, MD;  Location: [facility];  Service: Gastroenterology;;   SUBMUCOSAL TATTOO INJECTION  04/06/2023   Procedure: SUBMUCOSAL TATTOO INJECTION;  Surgeon: Hilarie Fredrickson, MD;  Location: [facility];  Service: Gastroenterology;;  :   Current Facility-Administered Medications  Medication Dose Route Frequency Provider Last Rate Last Admin   alvimopan (ENTEREG) capsule 12 mg  12 mg Oral BID Diamantina Monks, MD   12 mg at  04/09/23 0930   feeding supplement (ENSURE ENLIVE / ENSURE PLUS) liquid 237 mL  237 mL Oral BID BM Diamantina Monks, MD   237 mL at 04/09/23 1430   levothyroxine (SYNTHROID) tablet 75 mcg  75 mcg Oral QAC breakfast Hilarie Fredrickson, MD   75 mcg at 04/09/23 0629   [START ON 04/10/2023] multivitamin with minerals tablet 1 tablet  1 tablet Oral Daily Diamantina Monks, MD       ondansetron [facility]) tablet 4 mg   4 mg Oral Q6H PRN Hilarie Fredrickson, MD       Or   ondansetron [facility]) injection 4 mg  4 mg Intravenous Q6H PRN Hilarie Fredrickson, MD       oxyCODONE (Oxy IR/ROXICODONE) immediate release tablet 2.5-5 mg  2.5-5 mg Oral Q4H PRN Diamantina Monks, MD   2.5 mg at 04/09/23 0328   simvastatin (ZOCOR) tablet 20 mg  20 mg Oral Daily Hilarie Fredrickson, MD   20 mg at 04/09/23 0930     No Known Allergies:   Family History  Problem Relation Age of Onset   Anemia Father    Benign prostatic hyperplasia Brother    Cancer Daughter    Breast cancer Daughter    Kidney cancer Son        doing ok  :   Social History   Socioeconomic History   Marital status: Married    Spouse name: Not on file   Number of children: Not on file   Years of education: Not on file   Highest education level: Not on file  Occupational History   Not on file  Tobacco Use   Smoking status: Never   Smokeless tobacco: Never  Vaping Use   Vaping Use: Never used  Substance and Sexual Activity   Alcohol use: No   Drug use: No   Sexual activity: Not on file  Other Topics Concern   Not on file  Social History Narrative   Married. 1 living son (daughter died of cancer). 1 grandchild. 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 3x a week mows with self propelled.       Hobbies: a lot of time at church- building ramps etc, enjoys sports- football, basketball, baseball.    Social Determinants of Health   Financial Resource Strain: Low Risk  (01/30/2021)   Overall Financial Resource Strain (CARDIA)    Difficulty of Paying Living Expenses: Not hard at all  Food Insecurity: No Food Insecurity (03/01/2023)   Hunger Vital Sign    Worried About Running Out of Food in the Last Year: Never true    Ran Out of Food in the Last Year: Never true  Transportation Needs: No  Transportation Needs (03/01/2023)   PRAPARE - Administrator, Civil Service (Medical): No    Lack of Transportation (Non-Medical): No  Physical Activity: Inactive (01/30/2021)   Exercise Vital Sign    Days of Exercise per Week: 0 days    Minutes of Exercise per Session: 0 min  Stress: No Stress Concern Present (01/30/2021)   [employer] of Occupational Health - Occupational Stress Questionnaire    Feeling of Stress : Not at all  Social Connections: Moderately Integrated (01/30/2021)   Social Connection and Isolation Panel [NHANES]    Frequency  of Communication with Friends and Family: Twice a week    Frequency of Social Gatherings with Friends and Family: More than three times a week    Attends Religious Services: More than 4 times per year    Active Member of [employer] or Organizations: No    Attends Banker Meetings: Never    Marital Status: Married  Catering manager Violence: Not At Risk (01/30/2021)   Humiliation, Afraid, Rape, and Kick questionnaire    Fear of Current or Ex-Partner: No    Emotionally Abused: No    Physically Abused: No    Sexually Abused: No  :    Exam: Patient Vitals for the past 24 hrs:  BP Temp Temp src Pulse Resp SpO2  04/09/23 1355 (!) 119/55 98.2 F (36.8 C) Oral 77 18 99 %  04/09/23 0516 (!) 105/59 97.8 F (36.6 C) Oral 63 18 100 %  04/09/23 0142 98/62 97.7 F (36.5 C) Oral 71 18 100 %  04/08/23 2132 114/72 97.9 F (36.6 C) Oral 77 18 99 %  04/08/23 1816 132/72 (!) 97.5 F (36.4 C) -- 76 16 100 %    Physical Exam Constitutional:      Appearance: Normal appearance.  Cardiovascular:     Rate and Rhythm: Normal rate and regular rhythm.  Pulmonary:     Effort: Pulmonary effort is normal.     Breath sounds: Normal breath sounds.  Abdominal:     General: Bowel sounds are normal.  Musculoskeletal:        General: Normal range of motion.     Cervical back: Normal range of motion and neck supple. No rigidity.   Lymphadenopathy:     Cervical: No cervical adenopathy.  Skin:    General: Skin is warm and dry.  Neurological:     General: No focal deficit present.     Mental Status: He is alert.  Psychiatric:        Mood and Affect: Mood normal.       Lab Results  Component Value Date   WBC 10.4 04/09/2023   HGB 9.4 (L) 04/09/2023   HCT 30.2 (L) 04/09/2023   PLT 201 04/09/2023   GLUCOSE 160 (H) 04/09/2023   CHOL 128 12/18/2022   TRIG 68.0 12/18/2022   HDL 43.90 12/18/2022   LDLDIRECT 187.8 12/02/2006   LDLCALC 71 12/18/2022   ALT 23 04/04/2023   AST 25 04/04/2023   NA 137 04/09/2023   K 3.8 04/09/2023   CL 104 04/09/2023   CREATININE 1.01 04/09/2023   BUN 9 04/09/2023   CO2 26 04/09/2023    CT CHEST ABDOMEN PELVIS W CONTRAST  Result Date: 04/07/2023 CLINICAL DATA:  89 year old male with history of colonic mass. Evaluate for metastatic disease. * Tracking Code: BO * EXAM: CT CHEST, ABDOMEN, AND PELVIS WITH CONTRAST TECHNIQUE: Multidetector CT imaging of the chest, abdomen and pelvis was performed following the standard protocol during bolus administration of intravenous contrast. RADIATION DOSE REDUCTION: This exam was performed according to the departmental dose-optimization program which includes automated exposure control, adjustment of the mA and/or kV according to patient size and/or use of iterative reconstruction technique. CONTRAST:  OMNIPAQUE IOHEXOL 300 MG/ML  SOLN COMPARISON:  No prior chest CT. CT of the abdomen and pelvis 01/24/2018. FINDINGS: CT CHEST FINDINGS Cardiovascular: Heart size is normal. There is no significant pericardial fluid, thickening or pericardial calcification. There is aortic atherosclerosis, as well as atherosclerosis of the great vessels of the mediastinum and the coronary arteries,  including calcified atherosclerotic plaque in the left main, left anterior descending and right coronary arteries. Mediastinum/Nodes: No pathologically enlarged  mediastinal or hilar lymph nodes. Esophagus is unremarkable in appearance. No axillary lymphadenopathy. Lungs/Pleura: Small calcified granuloma in the left lower lobe. 4 mm nodule in the inferior aspect of the lingula (axial image 96 of series 4), nonspecific, but new compared to prior CT of the abdomen and pelvis 11/14/2015 which covered this region. No other definite suspicious appearing pulmonary nodules or masses are noted. No acute consolidative airspace disease. No pleural effusions. Scattered areas of post infectious or inflammatory scarring are noted, most evident in the right lower lobe where there is some mild cylindrical bronchiectasis, thickening of the peribronchovascular interstitium and regional architectural distortion, likely from remote pneumonia. Musculoskeletal: There are no aggressive appearing lytic or blastic lesions noted in the visualized portions of the skeleton. CT ABDOMEN PELVIS FINDINGS Hepatobiliary: No suspicious cystic or solid hepatic lesions. No intra or extrahepatic biliary ductal dilatation. Gallbladder is unremarkable in appearance. Pancreas: No pancreatic mass. No pancreatic ductal dilatation. No pancreatic or peripancreatic fluid collections or inflammatory changes. Spleen: Unremarkable. Adrenals/Urinary Tract: Multiple nonobstructive calculi are noted within the collecting systems of both kidneys (right greater than left) measuring up to 9 mm in the right renal collecting system. Low-attenuation lesions in both kidneys compatible with simple cysts (Bosniak class 1, no imaging follow-up recommended), largest of which in the interpolar region of the right kidney measures 6.1 cm in diameter. No aggressive appearing renal lesions. No hydroureteronephrosis. Urinary bladder is normal in appearance. Bilateral adrenal glands are normal in appearance. Stomach/Bowel: The appearance of the stomach is normal. No pathologic dilatation of small bowel or colon. A short segment of the  proximal sigmoid colon extends into a small left inguinal hernia. Severe mass-like thickening of the cecum is noted, with apparent involvement of the ileocecal junction including some extension to involve the terminal ileum, as well as more diffuse mass-like thickening of the proximal ascending colon. The epicenter of this mass-like thickening is centered around the ileocecal valve into the cecum, best appreciated on axial image 86 of series 2 and coronal image 55 of series 5 where this area appears to demonstrate increased enhancement and is estimated measure approximately 4.9 x 5.0 x 5.8 cm, although extensive mural thickening extends cephalad from this region into the proximal ascending colon, suggesting additional malignant involvement. Vascular/Lymphatic: Atherosclerosis in the abdominal aorta and pelvic vasculature, without evidence of aneurysm or dissection. Numerous prominent borderline enlarged and mildly enlarged ileocolic lymph nodes are noted, largest of which measures up to 1.4 cm (axial image 80 of series 2). Reproductive: Prostate gland and seminal vesicles are unremarkable in appearance. Other: No significant volume of ascites.  No pneumoperitoneum. Musculoskeletal: There are no aggressive appearing lytic or blastic lesions noted in the visualized portions of the skeleton. IMPRESSION: 1. Large mass centered in the region of the cecum with involvement of the ileocecal valve and the terminal ileum, as well as apparent extension to involve the proximal ascending colon. The epicenter of this mass measures approximately 4.9 x 5.0 x 5.8 cm, although this likely underestimates the true size of the lesion given the cephalad extension. There is associated lymphadenopathy in the ileocolic mesentery, likely metastatic. 2. No other definite signs of metastatic disease noted elsewhere in the chest, abdomen or pelvis. 3. There is a new 4 mm nodule in the inferior aspect of the lingula, nonspecific. The possibility  of a metastatic lesion is not excluded, but not strongly  favored. Close attention on follow-up studies is recommended to ensure stability. 4. Small left inguinal hernia containing a short segment of the proximal sigmoid colon, without evidence of bowel incarceration or obstruction at this time. 5. 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 6. Aortic atherosclerosis, in addition to left main and 2 vessel coronary artery disease. 7. Additional incidental findings, as above. Electronically Signed   By: Trudie Reed M.D.   On: 04/07/2023 08:11    Pathology: Invasive moderately differentiated colonic adenocarcinoma.  CT CHEST ABDOMEN PELVIS W CONTRAST  Result Date: 04/07/2023 CLINICAL DATA:  89 year old male with history of colonic mass. Evaluate for metastatic disease. * Tracking Code: BO * EXAM: CT CHEST, ABDOMEN, AND PELVIS WITH CONTRAST TECHNIQUE: Multidetector CT imaging of the chest, abdomen and pelvis was performed following the standard protocol during bolus administration of intravenous contrast. RADIATION DOSE REDUCTION: This exam was performed according to the departmental dose-optimization program which includes automated exposure control, adjustment of the mA and/or kV according to patient size and/or use of iterative reconstruction technique. CONTRAST:  OMNIPAQUE IOHEXOL 300 MG/ML  SOLN COMPARISON:  No prior chest CT. CT of the abdomen and pelvis 01/24/2018. FINDINGS: CT CHEST FINDINGS Cardiovascular: Heart size is normal. There is no significant pericardial fluid, thickening or pericardial calcification. There is aortic atherosclerosis, as well as atherosclerosis of the great vessels of the mediastinum and the coronary arteries, including calcified atherosclerotic plaque in the left main, left anterior descending and right coronary arteries. Mediastinum/Nodes: No pathologically  enlarged mediastinal or hilar lymph nodes. Esophagus is unremarkable in appearance. No axillary lymphadenopathy. Lungs/Pleura: Small calcified granuloma in the left lower lobe. 4 mm nodule in the inferior aspect of the lingula (axial image 96 of series 4), nonspecific, but new compared to prior CT of the abdomen and pelvis 11/14/2015 which covered this region. No other definite suspicious appearing pulmonary nodules or masses are noted. No acute consolidative airspace disease. No pleural effusions. Scattered areas of post infectious or inflammatory scarring are noted, most evident in the right lower lobe where there is some mild cylindrical bronchiectasis, thickening of the peribronchovascular interstitium and regional architectural distortion, likely from remote pneumonia. Musculoskeletal: There are no aggressive appearing lytic or blastic lesions noted in the visualized portions of the skeleton. CT ABDOMEN PELVIS FINDINGS Hepatobiliary: No suspicious cystic or solid hepatic lesions. No intra or extrahepatic biliary ductal dilatation. Gallbladder is unremarkable in appearance. Pancreas: No pancreatic mass. No pancreatic ductal dilatation. No pancreatic or peripancreatic fluid collections or inflammatory changes. Spleen: Unremarkable. Adrenals/Urinary Tract: Multiple nonobstructive calculi are noted within the collecting systems of both kidneys (right greater than left) measuring up to 9 mm in the right renal collecting system. Low-attenuation lesions in both kidneys compatible with simple cysts (Bosniak class 1, no imaging follow-up recommended), largest of which in the interpolar region of the right kidney measures 6.1 cm in diameter. No aggressive appearing renal lesions. No hydroureteronephrosis. Urinary bladder is normal in appearance. Bilateral adrenal glands are normal in appearance. Stomach/Bowel: The appearance of the stomach is normal. No pathologic dilatation of small bowel or colon. A short segment of  the proximal sigmoid colon extends into a small left inguinal hernia. Severe mass-like thickening of the cecum is noted, with apparent involvement of the ileocecal junction including some extension to involve the terminal ileum, as well as more diffuse mass-like thickening of the proximal ascending colon. The epicenter of this mass-like thickening  is centered around the ileocecal valve into the cecum, best appreciated on axial image 86 of series 2 and coronal image 55 of series 5 where this area appears to demonstrate increased enhancement and is estimated measure approximately 4.9 x 5.0 x 5.8 cm, although extensive mural thickening extends cephalad from this region into the proximal ascending colon, suggesting additional malignant involvement. Vascular/Lymphatic: Atherosclerosis in the abdominal aorta and pelvic vasculature, without evidence of aneurysm or dissection. Numerous prominent borderline enlarged and mildly enlarged ileocolic lymph nodes are noted, largest of which measures up to 1.4 cm (axial image 80 of series 2). Reproductive: Prostate gland and seminal vesicles are unremarkable in appearance. Other: No significant volume of ascites.  No pneumoperitoneum. Musculoskeletal: There are no aggressive appearing lytic or blastic lesions noted in the visualized portions of the skeleton. IMPRESSION: 1. Large mass centered in the region of the cecum with involvement of the ileocecal valve and the terminal ileum, as well as apparent extension to involve the proximal ascending colon. The epicenter of this mass measures approximately 4.9 x 5.0 x 5.8 cm, although this likely underestimates the true size of the lesion given the cephalad extension. There is associated lymphadenopathy in the ileocolic mesentery, likely metastatic. 2. No other definite signs of metastatic disease noted elsewhere in the chest, abdomen or pelvis. 3. There is a new 4 mm nodule in the inferior aspect of the lingula, nonspecific. The  possibility of a metastatic lesion is not excluded, but not strongly favored. Close attention on follow-up studies is recommended to ensure stability. 4. Small left inguinal hernia containing a short segment of the proximal sigmoid colon, without evidence of bowel incarceration or obstruction at this time. 5. 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 6. Aortic atherosclerosis, in addition to left main and 2 vessel coronary artery disease. 7. Additional incidental findings, as above. Electronically Signed   By: Trudie Reed M.D.   On: 04/07/2023 08:11    Assessment and Plan:   This is a pleasant 89 year old male patient with no significant past medical history with newly diagnosed adenocarcinoma of the cecum with extension into the proximal sigmoid colon and involving the ileocecal valve status post surgery with right hemicolectomy done, final pathology pending however there appears to be significant concern for large bulky mesenteric lymphadenopathy concerning for metastatic disease.  There is also a small 4 mm left lingular nodule which is indeterminate at this time.  Baseline CEA is normal.  There is no family history of colon cancer.  Besides hematochezia, some unintentional weight loss and fatigue, he denies any new complaints prior to presentation.  He is very active at baseline.  On physical exam, he has active bowel sounds, he is able to eat full liquid diet and had a bowel movement this morning and is recovering very well.  We have discussed that the final pathology is pending hence I cannot determine the adjuvant options however given the concern for malignant lymphadenopathy, we have discussed about high likelihood to need adjuvant chemotherapy.  Discussed that in patients with pathologic lymphadenopathy and colon cancer, depending on the final pathological staging they may need  adjuvant chemotherapy for 3 to 6 months.  We have not wild on the details of adjuvant chemotherapy.  We also briefly discussed about the surveillance after adjuvant chemotherapy which include staging scans, monitoring tumor markers and colonoscopies.  He appears to be of good performance status to tolerate adjuvant  chemotherapy.  I will try to establish him with medical oncology team upon discharge for follow-up and to discuss additional recommendations.   The length of time of the face-to-face encounter was 75 minutes. More than 50% of time was spent counseling and coordination of care.  Thank you for this referral.

## 2023-04-09 NOTE — Care Management Important Message (Signed)
Important Message  Patient Details IM Letter given. Name: Chad Fisher MRN: [id number] Date of Birth: 10-28-1932   Medicare Important Message Given:  Yes     Caren Macadam 04/09/2023, 3:04 PM

## 2023-04-09 NOTE — Progress Notes (Signed)
General Surgery Follow Up Note  Subjective:    Overnight Issues:   Objective:  Vital signs for last 24 hours: Temp:  [97.5 F (36.4 C)-98 F (36.7 C)] 97.8 F (36.6 C) (06/28 0516) Pulse Rate:  [53-100] 63 (06/28 0516) Resp:  [9-20] 18 (06/28 0516) BP: (98-188)/(56-99) 105/59 (06/28 0516) SpO2:  [94 %-100 %] 100 % (06/28 0516)  Hemodynamic parameters for last 24 hours:    Intake/Output from previous day: 06/27 0701 - 06/28 0700 In: 4583.8 [P.O.:1140; I.V.:2583.6; IV Piggyback:860.2] Out: 2150 [Urine:2100; Blood:50]  Intake/Output this shift: No intake/output data recorded.  Vent settings for last 24 hours:    Physical Exam:  Gen: comfortable, no distress Neuro: follows commands, alert, communicative HEENT: PERRL Neck: supple CV: RRR Pulm: unlabored breathing on RA Abd: soft, NT, incision clean, dry, intact  GU: urine clear and yellow, +spontaneous void Extr: wwp, no edema  Results for orders placed or performed during the hospital encounter of 04/04/23 (from the past 24 hour(s))  CBC     Status: Abnormal   Collection Time: 04/08/23 11:45 AM  Result Value Ref Range   WBC 12.4 (H) 4.0 - 10.5 K/uL   RBC 4.00 (L) 4.22 - 5.81 MIL/uL   Hemoglobin 10.5 (L) 13.0 - 17.0 g/dL   HCT 16.1 (L) 09.6 - 04.5 %   MCV 85.0 80.0 - 100.0 fL   MCH 26.3 26.0 - 34.0 pg   MCHC 30.9 30.0 - 36.0 g/dL   RDW 40.9 81.1 - 91.4 %   Platelets 223 150 - 400 K/uL   nRBC 0.0 0.0 - 0.2 %  CBC     Status: Abnormal   Collection Time: 04/09/23  3:27 AM  Result Value Ref Range   WBC 10.4 4.0 - 10.5 K/uL   RBC 3.60 (L) 4.22 - 5.81 MIL/uL   Hemoglobin 9.4 (L) 13.0 - 17.0 g/dL   HCT 78.2 (L) 95.6 - 21.3 %   MCV 83.9 80.0 - 100.0 fL   MCH 26.1 26.0 - 34.0 pg   MCHC 31.1 30.0 - 36.0 g/dL   RDW 08.6 57.8 - 46.9 %   Platelets 201 150 - 400 K/uL   nRBC 0.0 0.0 - 0.2 %  Basic metabolic panel     Status: Abnormal   Collection Time: 04/09/23  3:27 AM  Result Value Ref Range   Sodium 137 135 -  145 mmol/L   Potassium 3.8 3.5 - 5.1 mmol/L   Chloride 104 98 - 111 mmol/L   CO2 26 22 - 32 mmol/L   Glucose, Bld 160 (H) 70 - 99 mg/dL   BUN 9 8 - 23 mg/dL   Creatinine, Ser 6.29 0.61 - 1.24 mg/dL   Calcium 8.0 (L) 8.9 - 10.3 mg/dL   GFR, Estimated >52 >84 mL/min   Anion gap 7 5 - 15    Assessment & Plan:  Present on Admission:  Hematochezia  Hypothyroidism  Hyperlipidemia  Colon cancer (HCC)    LOS: 4 days   Additional comments:I reviewed the patient's new clinical lab test results.   and I reviewed the patients new imaging test results.    Ascending colon mass, biopsy confirmed adenocarcinoma - s/p colonoscopy 6/25, biopsy confirmed malignant ascending colon mass, transverse colon tubular adenoma  - CT CAP completed for staging - 4 mm nodule in inferior aspect of lingula that is not strongly suggestive of metastasis but should be followed  - CEA 2.1 - s/p open R hemicolectomy 6/27 this AM. AROBF, continue  CLD.    FEN: CLD VTE: SCDs ID: no current abx   HLD Hypothyroidism B12 deficiency/pernicious anemia  CKD III   Diamantina Monks, MD Trauma & General Surgery Please use [email or web address] to contact on call provider  04/09/2023  *Care during the described time interval was provided by me. I have reviewed this patient's available data, including medical history, events of note, physical examination and test results as part of my evaluation.

## 2023-04-09 NOTE — Op Note (Signed)
[facility] Patient Name: Chad Fisher Procedure Date: 04/07/2023 MRN: [id number] Attending MD: Wilhemina Bonito. Marina Goodell , MD, [id number] Date of Birth: 1933-05-03 CSN: [id number] Age: 89 Admit Type: Inpatient Procedure:                Colonoscopy with biopsies, snare polypectomy,                            submucosal tattoo Indications:              Rectal bleeding Providers:                Wilhemina Bonito. Marina Goodell, MD, Adin Hector, RN, Fransisca Connors, Marja Kays, Technician Referring MD:             [facility] Medicines:                Monitored Anesthesia Care Complications:            No immediate complications. Estimated Blood Loss:     Estimated blood loss: none. Procedure:                Pre-Anesthesia Assessment:                           - Prior to the procedure, a History and Physical                            was performed, and patient medications and                            allergies were reviewed. The patient's tolerance of                            previous anesthesia was also reviewed. The risks                            and benefits of the procedure and the sedation                            options and risks were discussed with the patient.                            All questions were answered, and informed consent                            was obtained. Prior Anticoagulants: The patient has                            taken no anticoagulant or antiplatelet agents. ASA                            Grade Assessment: II - A patient with mild systemic  disease. After reviewing the risks and benefits,                            the patient was deemed in satisfactory condition to                            undergo the procedure.                           After obtaining informed consent, the colonoscope                            was passed under direct vision. Throughout the                             procedure, the patient's blood pressure, pulse, and                            oxygen saturations were monitored continuously. The                            CF-HQ190L ([id number]) Olympus colonoscope was                            introduced through the anus and advanced to the the                            ileocecal valve. After obtaining informed consent,                            the colonoscope was passed under direct vision.                            Throughout the procedure, the patient's blood                            pressure, pulse, and oxygen saturations were                            monitored continuously.The colonoscopy was                            performed without difficulty. The patient tolerated                            the procedure well.                           NOTE: The [employer] reporting system was not                            functioning during this procedure. No images. The                            full operative note was entered  on the day of the                            procedure, immediately post procedure. See                            operative report area. This report is being                            completed 3 days later to satisfy documentation                            request. The CF-HQ190L ([id number]) Olympus                            colonoscope was introduced through the anus and                            advanced to the the ileocecal valve. Scope In: 12:37:19 PM Scope Out: 1:05:34 PM Scope Withdrawal Time: 0 hours 24 minutes 11 seconds  Total Procedure Duration: 0 hours 28 minutes 15 seconds  Findings:      FINDINGS:      The adult colonoscope was advanced per rectum to the level of the right       colon. The preparation was excellent at that point a large malignant       appearing mass was encountered. It was difficult to tell whether the       mass was sitting in the cecum or distal to the cecum with obstructing        properties. Multiple biopsies were taken. Marking tattoos with Spot       (carbon black) was made a few centimeters distal (downstream toward the       anus) to the mass. A 5 mm transverse colon polyp was removed with cold       snare and submitted for pathologic analysis. The remainder the       examination of the colon was normal, including retroflexed view of the       rectum.      IMPRESSION:      1. Malignant mass of the right colon (possible cecum) status post       biopsies. Status post marking tattoo      2. Small transverse colon polyp removed with endoscopic snare and       submitted for pathology.      3. Otherwise normal colon on direct and retroflexed views.      Recommendations:      1. Clear liquid diet      2. Contrast-enhanced CT scan of the chest, abdomen and pelvis "right       colon mass, rule out metastases"      3. Follow-up biopsies      4. Consult general surgery      Discussed with the patient post procedure and the patient's wife (via       telephone). Impression:               - Right colon mass, transverse colon polyp. See  above. Moderate Sedation:      none Recommendation:           - Await pathology results.                           - Repeat colonoscopy in 1 year for surveillance. Procedure Code(s):        --- Professional ---                           [id number], Colonoscopy, flexible; with removal of                            tumor(s), polyp(s), or other lesion(s) by snare                            technique Diagnosis Code(s):        --- Professional ---                           D12.3, Benign neoplasm of transverse colon (hepatic                            flexure or splenic flexure)                           K62.5, Hemorrhage of anus and rectum CPT copyright 2022 American Medical Association. All rights reserved. The codes documented in this report are preliminary and upon coder review may  be revised to meet current  compliance requirements. Wilhemina Bonito. Marina Goodell, MD 04/09/2023 2:36:06 PM This report has been signed electronically. Number of Addenda: 0

## 2023-04-09 NOTE — Progress Notes (Signed)
[facility] signoff note   The care of this patient has been transferred from the [facility] service to the following service:  Service: CCS Attending: CCS MD. I have discussed with Dr. Bedelia Person Date & time: 04/09/2023, 9:06 AM  I did a courtesy visit with patient this morning.  Patient was on the phone with his spouse and discussed with both of them.  He stated that he was doing well.  Advised him that patient's care has been transferred to the surgical team and the hospitalist team will sign off but will be available if need arises.  Both verbalized understanding.  Please re consult the [facility] team for any further assistance.  Thank you   [employer]. MD FACP, FHM, SFHM, CMPC, [facility]   [facility] Physician Advisor [place]     To contact a [facility] provider, please log into the web site [email or web address] and access using universal [place] password for that web site. If you do not have the password, please call the hospital operator. Page "[facility] Admits & Consults" listed on top of the page. Provide a number where you can be directly reached.

## 2023-04-09 NOTE — Progress Notes (Signed)
Mobility Specialist - Progress Note   04/09/23 0930  Mobility  Activity Ambulated with assistance in hallway  Level of Assistance Standby assist, set-up cues, supervision of patient - no hands on  Assistive Device Front wheel walker  Distance Ambulated (ft) 350 ft  Activity Response Tolerated well  Mobility Referral Yes  $Mobility charge 1 Mobility  Mobility Specialist Start Time (ACUTE ONLY) [id number]  Mobility Specialist Stop Time (ACUTE ONLY) 0930  Mobility Specialist Time Calculation (min) (ACUTE ONLY) 14 min   Pt received in bed and agreeable to mobility. No complaints during session. Pt to bed after session with all needs met & nurse in room.   [facility]

## 2023-04-09 NOTE — Plan of Care (Signed)
  Problem: Activity: Goal: Risk for activity intolerance will decrease Outcome: Progressing   Problem: Nutrition: Goal: Adequate nutrition will be maintained Outcome: Progressing   Problem: Elimination: Goal: Will not experience complications related to bowel motility Outcome: Progressing   

## 2023-04-09 NOTE — Progress Notes (Signed)
Initial Nutrition Assessment  DOCUMENTATION CODES:   Non-severe (moderate) malnutrition in context of chronic illness  INTERVENTION:  - Full Liquid diet per Surgery. Advance as tolerated.  - Ensure Plus High Protein po BID, each supplement provides 350 kcal and 20 grams of protein. - Multivitamin with minerals daily - Monitor weight trends.   NUTRITION DIAGNOSIS:   Moderate Malnutrition related to chronic illness (colon cancer) as evidenced by mild fat depletion, mild muscle depletion.  GOAL:   Patient will meet greater than or equal to 90% of their needs  MONITOR:   PO intake  REASON FOR ASSESSMENT:   Consult Assessment of nutrition requirement/status  ASSESSMENT:   89 y.o. male with PMH HLD who presented with hematochezia. Found to have large colon mass with biopsy confirming adenocarcinoma.   6/23 Admit 6/27 ex-lap, right hemicolectomy  Patient in bed at time of visit, wife at bedside. Patient reports a UBW of 155# and weight loss over the past 3 months. Per EMR, patient has had an 8# or 5.3% weight loss in the past 4 months.  He reports typically eating 2-3 meals a day at home. Over the past 3 months, appetite has been very poor so he has only been eating small portions at meals.   Current appetite is slightly improved from what it has been. He is thankful to be advanced to a full liquid diet this AM. Eating cream of wheat during visit. Patient agreeable to try Ensure to support intake, feels he may like it mixed with ice cream as he didn't like the Ensure he got last night. Patient noted to receive Ensure Pre-Surgery last night per ERAS protocol. Discussed that the Ensure he would receive would be different, hopefully he will like this more. Patient endorsed understanding.    Medications reviewed and include: -  Labs reviewed:  HA1C 6.3   NUTRITION - FOCUSED PHYSICAL EXAM:  Flowsheet Row Most Recent Value  Orbital Region Mild depletion  Upper Arm Region No  depletion  Thoracic and Lumbar Region No depletion  Buccal Region Mild depletion  Temple Region Mild depletion  Clavicle Bone Region No depletion  Clavicle and Acromion Bone Region Mild depletion  Scapular Bone Region Unable to assess  Dorsal Hand No depletion  Patellar Region Mild depletion  Anterior Thigh Region Mild depletion  Posterior Calf Region Mild depletion  Edema (RD Assessment) None  Hair Reviewed  Eyes Reviewed  Mouth Reviewed  Skin Reviewed  Nails Reviewed       Diet Order:   Diet Order             Diet full liquid Room service appropriate? Yes; Fluid consistency: Thin  Diet effective now                   EDUCATION NEEDS:  Education needs have been addressed  Skin:  Skin Assessment: Skin Integrity Issues: Skin Integrity Issues:: Incisions Incisions: Abdomen  Last BM:  6/27  Height:  Ht Readings from Last 1 Encounters:  04/08/23 5\' 8"  (1.727 m)   Weight:  Wt Readings from Last 1 Encounters:  04/08/23 64.4 kg    BMI:  Body mass index is 21.59 kg/m.  Estimated Nutritional Needs:  Kcal:  1800-2000 kcals Protein:  85-100 grams Fluid:  >/= 1.8L    Shelle Iron RD, LDN For contact information, refer to [facility].

## 2023-04-10 MED ORDER — ACETAMINOPHEN 325 MG PO TABS
650.0000 mg | ORAL_TABLET | Freq: Four times a day (QID) | ORAL | Status: DC | PRN
  Administered 2023-04-10 (×2): 650 mg via ORAL
  Filled 2023-04-10 (×2): qty 2

## 2023-04-10 NOTE — Progress Notes (Signed)
Mobility Specialist - Progress Note   04/10/23 0924  Mobility  Activity Ambulated with assistance in hallway  Level of Assistance Standby assist, set-up cues, supervision of patient - no hands on  Assistive Device Front wheel walker  Distance Ambulated (ft) 350 ft  Activity Response Tolerated well  Mobility Referral Yes  $Mobility charge 1 Mobility  Mobility Specialist Start Time (ACUTE ONLY) [id number]  Mobility Specialist Stop Time (ACUTE ONLY) [id number]  Mobility Specialist Time Calculation (min) (ACUTE ONLY) 7 min   Pt received in recliner and agreeable to mobility. No complaints during session. Pt to recliner after session with all needs met.    [facility]

## 2023-04-10 NOTE — Progress Notes (Signed)
Mobility Specialist - Progress Note   04/10/23 1408  Mobility  Activity Ambulated with assistance in hallway  Level of Assistance Standby assist, set-up cues, supervision of patient - no hands on  Assistive Device Front wheel walker  Distance Ambulated (ft) 190 ft  Activity Response Tolerated well  Mobility Referral Yes  $Mobility charge 1 Mobility  Mobility Specialist Start Time (ACUTE ONLY) 0152  Mobility Specialist Stop Time (ACUTE ONLY) 0203  Mobility Specialist Time Calculation (min) (ACUTE ONLY) 11 min   Pt received in bed and agreeable to mobility. No complaints during session. Pt to bed after session with all needs met.    [facility]

## 2023-04-10 NOTE — Progress Notes (Signed)
2 Days Post-Op   Subjective/Chief Complaint: Patient had several bowel movements yesterday Tolerating full liquids Minimal discomfort - not using pain meds Oncology evaluation - outpatient follow-up to review path   Objective: Vital signs in last 24 hours: Temp:  [98.2 F (36.8 C)-98.5 F (36.9 C)] 98.2 F (36.8 C) (06/29 0610) Pulse Rate:  [74-78] 74 (06/29 0610) Resp:  [16-18] 16 (06/29 0610) BP: (119-136)/(55-77) 125/77 (06/29 0610) SpO2:  [94 %-100 %] 94 % (06/29 0610) Last BM Date : 04/08/23  Intake/Output from previous day: 06/28 0701 - 06/29 0700 In: 607.2 [P.O.:240; I.V.:267.2; IV Piggyback:100] Out: -  Intake/Output this shift: No intake/output data recorded.  WDWN in NAD Abd - soft, minimal tenderness Incision c/d/i  Lab Results:  Recent Labs    04/08/23 1145 04/09/23 0327  WBC 12.4* 10.4  HGB 10.5* 9.4*  HCT 34.0* 30.2*  PLT 223 201   BMET Recent Labs    04/09/23 0327  NA 137  K 3.8  CL 104  CO2 26  GLUCOSE 160*  BUN 9  CREATININE 1.01  CALCIUM 8.0*   Pathology pending  Anti-infectives: Anti-infectives (From admission, onward)    Start     Dose/Rate Route Frequency Ordered Stop   04/08/23 0815  cefoTEtan (CEFOTAN) 2 g in sodium chloride 0.9 % 100 mL IVPB        2 g 200 mL/hr over 30 Minutes Intravenous On call to O.R. 04/08/23 0810 04/08/23 1230   04/08/23 0100  neomycin (MYCIFRADIN) tablet 1,000 mg  Status:  Discontinued       See Hyperspace for full Linked Orders Report.   1,000 mg Oral 3 times per day 04/08/23 0010 04/08/23 1219   04/08/23 0100  metroNIDAZOLE (FLAGYL) tablet 1,000 mg  Status:  Discontinued       See Hyperspace for full Linked Orders Report.   1,000 mg Oral 3 times per day 04/08/23 0010 04/08/23 1219       Assessment/Plan: Ascending colon mass, biopsy confirmed adenocarcinoma - s/p colonoscopy 6/25, biopsy confirmed malignant ascending colon mass, transverse colon tubular adenoma  - CT CAP completed for staging  - 4 mm nodule in inferior aspect of lingula that is not strongly suggestive of metastasis but should be followed  - CEA 2.1 - s/p open R hemicolectomy 6/27 this AM.  - soft low fiber diet;  - Probable discharge tomorrow 6/30 FEN: soft diet VTE: SCDs ID: no current abx   HLD Hypothyroidism B12 deficiency/pernicious anemia  CKD III   LOS: 5 days    Chad Fisher 04/10/2023

## 2023-04-10 NOTE — Plan of Care (Signed)
  Problem: Education: Goal: Knowledge of General Education information will improve Description: Including pain rating scale, medication(s)/side effects and non-pharmacologic comfort measures Outcome: Not Progressing   Problem: Health Behavior/Discharge Planning: Goal: Ability to manage health-related needs will improve Outcome: Not Progressing   Problem: Clinical Measurements: Goal: Ability to maintain clinical measurements within normal limits will improve Outcome: Not Progressing Goal: Will remain free from infection Outcome: Not Progressing Goal: Diagnostic test results will improve Outcome: Not Progressing Goal: Respiratory complications will improve Outcome: Not Progressing Goal: Cardiovascular complication will be avoided Outcome: Not Progressing   Problem: Activity: Goal: Risk for activity intolerance will decrease Outcome: Not Progressing   Problem: Nutrition: Goal: Adequate nutrition will be maintained Outcome: Not Progressing   Problem: Coping: Goal: Level of anxiety will decrease Outcome: Not Progressing   Problem: Elimination: Goal: Will not experience complications related to bowel motility Outcome: Not Progressing Goal: Will not experience complications related to urinary retention Outcome: Not Progressing   Problem: Pain Managment: Goal: General experience of comfort will improve Outcome: Not Progressing   Problem: Safety: Goal: Ability to remain free from injury will improve Outcome: Not Progressing   Problem: Skin Integrity: Goal: Risk for impaired skin integrity will decrease Outcome: Not Progressing   Problem: Education: Goal: Understanding of discharge needs will improve Outcome: Not Progressing Goal: Verbalization of understanding of the causes of altered bowel function will improve Outcome: Not Progressing   Problem: Activity: Goal: Ability to tolerate increased activity will improve Outcome: Not Progressing   Problem:  Bowel/Gastric: Goal: Gastrointestinal status for postoperative course will improve Outcome: Not Progressing   Problem: Health Behavior/Discharge Planning: Goal: Identification of community resources to assist with postoperative recovery needs will improve Outcome: Not Progressing   Problem: Nutritional: Goal: Will attain and maintain optimal nutritional status will improve Outcome: Not Progressing   Problem: Clinical Measurements: Goal: Postoperative complications will be avoided or minimized Outcome: Not Progressing   Problem: Respiratory: Goal: Respiratory status will improve Outcome: Not Progressing   Problem: Skin Integrity: Goal: Will show signs of wound healing Outcome: Not Progressing

## 2023-04-10 NOTE — Progress Notes (Signed)
Patient reports having a dark brown stool this evening. It was flushed and RN unable to observe. Haydee Salter, RN 04/10/23 6:32 PM

## 2023-04-11 MED ORDER — OXYCODONE HCL 5 MG PO TABS: 5.0000 mg | ORAL_TABLET | Freq: Four times a day (QID) | ORAL | 0 refills | Status: DC | PRN

## 2023-04-11 NOTE — Discharge Summary (Signed)
Physician Discharge Summary  Patient ID: Chad Fisher MRN: [id number] DOB/AGE: 87-Dec-1934 87 y.o.  Admit date: 04/04/2023 Discharge date: 04/11/2023  Admission Diagnoses: Hematochezia Right colon adenocarcinoma  Discharge Diagnoses:  Right colon adenocarcinoma Principal Problem:   Adenocarcinoma of colon (HCC) Active Problems:   Hyperlipidemia   Hypothyroidism   Hematochezia   Mass of colon   Normocytic anemia   Abnormal CT of the abdomen   Malnutrition of moderate degree   Discharged Condition: good  Hospital Course: Patient admitted 04/04/23 with hematochezia and weight loss.  Colonoscopy revealed an ascending colon mass, confirmed to be adenocarcinoma.  Staging CT scans showed lymphadenopathy but no sign of distant metastatic disease.  He underwent right hemicolectomy on 6/27.  He has progressed quite well since surgery and is tolerating a regular diet with some bowel movements.  Pain controlled with tylenol.  Oncology has consulted on the patient and will arrange outpatient follow-up.  Final pathology is still pending.  Consults: hematology/oncology  Significant Diagnostic Studies: CEA 2.1 Pathology pending  Treatments: colonoscopy 6/25 surgery: right hemicolectomy 6/27 - Dr. Bedelia Person  Discharge Exam: Blood pressure 132/75, pulse 67, temperature 98.5 F (36.9 C), temperature source Oral, resp. rate 18, height 5\' 8"  (1.727 m), weight 64.4 kg, SpO2 96 %. WDWN in NAD Abd - soft, minimal tenderness Incision c/d/i  Disposition: Discharge disposition: 01-Home or Self Care       Discharge Instructions     Call MD for:  persistant nausea and vomiting   Complete by: As directed    Call MD for:  redness, tenderness, or signs of infection (pain, swelling, redness, odor or green/yellow discharge around incision site)   Complete by: As directed    Call MD for:  severe uncontrolled pain   Complete by: As directed    Call MD for:  temperature >100.4   Complete by: As  directed    Diet general   Complete by: As directed    Driving Restrictions   Complete by: As directed    Do not drive while taking pain medications   Increase activity slowly   Complete by: As directed    May shower / Bathe   Complete by: As directed       Allergies as of 04/11/2023   No Known Allergies      Medication List     TAKE these medications    acetaminophen 500 MG tablet Commonly known as: TYLENOL Take 1,000 mg by mouth every 6 (six) hours as needed for fever.   cyanocobalamin 1000 MCG/ML injection Commonly known as: VITAMIN B12 INJECT 1mL INTRAMUSCULARLY EVERY 30 DAYS What changed:  how much to take how to take this when to take this additional instructions   ICAPS AREDS 2 PO Take 1 capsule by mouth 2 (two) times daily.   levothyroxine 75 MCG tablet Commonly known as: SYNTHROID TAKE 1 TABLET BY MOUTH DAILY What changed: when to take this   oxyCODONE 5 MG immediate release tablet Commonly known as: Oxy IR/ROXICODONE Take 1 tablet (5 mg total) by mouth every 6 (six) hours as needed for moderate pain or severe pain (2.5mg  for moderate pain, 5mg  for severe pain).   Potassium Citrate 15 MEQ (1620 MG) Tbcr Take 1 tablet by mouth daily.   simvastatin 20 MG tablet Commonly known as: ZOCOR TAKE 1 TABLET BY MOUTH DAILY What changed: when to take this        Follow-up Information     Diamantina Monks, MD Follow up in  1 week(s).   Specialty: Surgery Contact information: [address] [place] [address] [facility] [place] [facility] [place] [address] [phone]                 Signed: Wynona Luna 04/11/2023, 8:27 AM

## 2023-04-11 NOTE — Discharge Instructions (Signed)
[facility]      [facility] [phone]  OPEN ABDOMINAL SURGERY: POST OP INSTRUCTIONS  Always review your discharge instruction sheet given to you by the facility where your surgery was performed.  IF YOU HAVE DISABILITY OR FAMILY LEAVE FORMS, YOU MUST BRING THEM TO THE OFFICE FOR PROCESSING.  PLEASE DO NOT GIVE THEM TO YOUR DOCTOR.  A prescription for pain medication may be given to you upon discharge.  Take your pain medication as prescribed, if needed.  If narcotic pain medicine is not needed, then you may take acetaminophen (Tylenol) or ibuprofen (Advil) as needed. Take your usually prescribed medications unless otherwise directed. If you need a refill on your pain medication, please contact your pharmacy. They will contact our office to request authorization.  Prescriptions will not be filled after 5pm or on week-ends. You should follow a light diet the first few days after arrival home, such as soup and crackers, pudding, etc.unless your doctor has advised otherwise. A high-fiber, low fat diet can be resumed as tolerated.   Be sure to include lots of fluids daily. Most patients will experience some swelling and bruising on the chest and neck area.  Ice packs will help.  Swelling and bruising can take several days to resolve Most patients will experience some swelling and bruising in the area of the incision. Ice pack will help. Swelling and bruising can take several days to resolve..  It is common to experience some constipation if taking pain medication after surgery.  Increasing fluid intake and taking a stool softener will usually help or prevent this problem from occurring.  A mild laxative (Milk of Magnesia or Miralax) should be taken according to package directions if there are no bowel movements after 48 hours.  You may have steri-strips (small skin tapes) in place directly over the incision.  These strips should be left on the skin for 7-10 days.  If your surgeon used skin  glue on the incision, you may shower in 24 hours.  The glue will flake off over the next 2-3 weeks.  Any sutures or staples will be removed at the office during your follow-up visit. You may find that a light gauze bandage over your incision may keep your staples from being rubbed or pulled. You may shower and replace the bandage daily. ACTIVITIES:  You may resume regular (light) daily activities beginning the next day--such as daily self-care, walking, climbing stairs--gradually increasing activities as tolerated.  You may have sexual intercourse when it is comfortable.  Refrain from any heavy lifting or straining until approved by your doctor. You may drive when you no longer are taking prescription pain medication, you can comfortably wear a seatbelt, and you can safely maneuver your car and apply brakes Return to Work: ___________________________________ You should see your doctor in the office for a follow-up appointment approximately two weeks after your surgery.  Make sure that you call for this appointment within a day or two after you arrive home to insure a convenient appointment time. OTHER INSTRUCTIONS:  _____________________________________________________________ _____________________________________________________________  WHEN TO CALL YOUR DOCTOR: Fever over 101.0 Inability to urinate Nausea and/or vomiting Extreme swelling or bruising Continued bleeding from incision. Increased pain, redness, or drainage from the incision. Difficulty swallowing or breathing Muscle cramping or spasms. Numbness or tingling in hands or feet or around lips.  The clinic staff is available to answer your questions during regular business hours.  Please don't hesitate to call and ask to speak to one of   the nurses if you have concerns.  For further questions, please visit [email or web address]  

## 2023-04-12 ENCOUNTER — Telehealth: Payer: Self-pay

## 2023-04-12 ENCOUNTER — Encounter: Payer: Self-pay | Admitting: *Deleted

## 2023-04-12 NOTE — Progress Notes (Signed)
PATIENT NAVIGATOR PROGRESS NOTE  Name: CHANE LUNN Date: 04/12/2023 MRN: [id number]  DOB: 1932-11-30   Reason for visit:  Introductory phone call  Comments:  Called and spoke with Mr Sterman regarding New pt appt. Scheduled him with Dr Truett Perna on 04/27/23 at 1:40  Reviewed directions to building and parking and gave him contact number to call with any questions    Time spent counseling/coordinating care: 30-45 minutes

## 2023-04-12 NOTE — Transitions of Care (Post Inpatient/ED Visit) (Signed)
04/12/2023  Name: Chad Fisher MRN: [id number] DOB: 1932-11-01  Today's TOC FU Call Status: Today's TOC FU Call Status:: Successful TOC FU Call Competed TOC FU Call Complete Date: 04/12/23 (Call completed with spouse.)  Transition Care Management Follow-up Telephone Call Date of Discharge: 04/11/23 Discharge Facility: [facility]) Type of Discharge: Inpatient Admission Primary Inpatient Discharge Diagnosis:: "rectal bleeding" How have you been since you were released from the hospital?: Better (Wife states pt is doing well-appetite is improving-no GI complaints. LBM this AM-and no bleeding noted. Pain is very minimal and controlled with Tylenol prn.) Any questions or concerns?: No  Items Reviewed: Did you receive and understand the discharge instructions provided?: Yes Medications obtained,verified, and reconciled?: Yes (Medications Reviewed) Any new allergies since your discharge?: No Dietary orders reviewed?: Yes Type of Diet Ordered:: low salt/heart healthy Do you have support at home?: Yes People in Home: spouse Name of Support/Comfort Primary Source: Meriam Sprague  Medications Reviewed Today: Medications Reviewed Today     Reviewed by Charlyn Minerva, RN (Registered Nurse) on 04/12/23 at 1418  Med List Status: <None>   Medication Order Taking? Sig Documenting Provider Last Dose Status Informant  acetaminophen (TYLENOL) 500 MG tablet [id number] Yes Take 1,000 mg by mouth every 6 (six) hours as needed for fever. [provider] Taking Active Self  cyanocobalamin (VITAMIN B12) 1000 MCG/ML injection [id number] Yes INJECT 1mL INTRAMUSCULARLY EVERY 30 DAYS  Patient taking differently: Inject 1,000 mcg into the muscle every 30 (thirty) days.   Shelva Majestic, MD Taking Active Self  levothyroxine (SYNTHROID) 75 MCG tablet [id number] Yes TAKE 1 TABLET BY MOUTH DAILY  Patient taking differently: Take 75 mcg by mouth daily before breakfast.   Shelva Majestic, MD  Taking Active Self  Multiple Vitamins-Minerals (ICAPS AREDS 2 PO) [id number] Yes Take 1 capsule by mouth 2 (two) times daily. [provider] Taking Active Self  oxyCODONE (OXY IR/ROXICODONE) 5 MG immediate release tablet [id number] No Take 1 tablet (5 mg total) by mouth every 6 (six) hours as needed for moderate pain or severe pain (2.5mg  for moderate pain, 5mg  for severe pain).  Patient not taking: Reported on 04/12/2023   Manus Rudd, MD Not Taking Active   Potassium Citrate 15 MEQ (1620 MG) TBCR [id number] Yes Take 1 tablet by mouth daily. [provider] Taking Active Self  simvastatin (ZOCOR) 20 MG tablet [id number] Yes TAKE 1 TABLET BY MOUTH DAILY  Patient taking differently: Take 20 mg by mouth every evening.   Shelva Majestic, MD Taking Active Self            Home Care and Equipment/Supplies: Were Home Health Services Ordered?: NA Any new equipment or medical supplies ordered?: NA  Functional Questionnaire: Do you need assistance with bathing/showering or dressing?: No Do you need assistance with meal preparation?: No Do you need assistance with eating?: No Do you have difficulty maintaining continence: No Do you need assistance with getting out of bed/getting out of a chair/moving?: No Do you have difficulty managing or taking your medications?: No  Follow up appointments reviewed: PCP Follow-up appointment confirmed?: No (Wife declined making PCP appt-states just saw a few appts ago and pt following up with specialists-will make an appt with MD when "things settle down some") MD Provider Line [phone] Given: No Specialist Hospital Follow-up appointment confirmed?: Yes Date of Specialist follow-up appointment?: 04/27/23 (Wife will call Dr. Marvetta Gibbons office per d/c instructions today to make f/u appt) Follow-Up Specialty Provider:: Dr. Truett Perna Do you  need transportation to your follow-up appointment?: No (wife voices she does not drive but pt  able to drive himself to appts or they have neighbors & church members to help them out if needed) Do you understand care options if your condition(s) worsen?: Yes-patient verbalized understanding  SDOH Interventions Today    Flowsheet Row Most Recent Value  SDOH Interventions   Food Insecurity Interventions Intervention Not Indicated  Transportation Interventions Intervention Not Indicated      TOC Interventions Today    Flowsheet Row Most Recent Value  TOC Interventions   TOC Interventions Discussed/Reviewed TOC Interventions Discussed      Interventions Today    Flowsheet Row Most Recent Value  General Interventions   General Interventions Discussed/Reviewed General Interventions Discussed, Doctor Visits  Doctor Visits Discussed/Reviewed Doctor Visits Discussed, Specialist, PCP  PCP/Specialist Visits Compliance with follow-up visit  Education Interventions   Education Provided Provided Education  Provided Verbal Education On Nutrition, When to see the doctor, Medication  Nutrition Interventions   Nutrition Discussed/Reviewed Nutrition Discussed  Pharmacy Interventions   Pharmacy Dicussed/Reviewed Pharmacy Topics Discussed, Medications and their functions  Safety Interventions   Safety Discussed/Reviewed Safety Discussed        Alessandra Gr[facility] Care Management Care Management Community Coordinator Direct Phone: [phone] Toll Free: [phone] Fax: [phone]

## 2023-04-13 ENCOUNTER — Other Ambulatory Visit: Payer: Self-pay

## 2023-04-13 DIAGNOSIS — R238 Other skin changes: Secondary | ICD-10-CM | POA: Diagnosis not present

## 2023-04-16 LAB — SURGICAL PATHOLOGY

## 2023-04-23 ENCOUNTER — Ambulatory Visit: Payer: Medicare Other | Admitting: Podiatry

## 2023-04-23 ENCOUNTER — Encounter: Payer: Self-pay | Admitting: Podiatry

## 2023-04-23 DIAGNOSIS — N183 Chronic kidney disease, stage 3 unspecified: Secondary | ICD-10-CM

## 2023-04-23 DIAGNOSIS — B351 Tinea unguium: Secondary | ICD-10-CM

## 2023-04-23 DIAGNOSIS — M79675 Pain in left toe(s): Secondary | ICD-10-CM

## 2023-04-23 DIAGNOSIS — M79674 Pain in right toe(s): Secondary | ICD-10-CM

## 2023-04-23 NOTE — Progress Notes (Signed)
This patient returns to my office for at risk foot care.  This patient requires this care by a professional since this patient will be at risk due to having  CKD.    This patient is unable to cut nails himself since the patient cannot reach his nails.These nails are painful walking and wearing shoes.  This patient presents for at risk foot care today.  General Appearance  Alert, conversant and in no acute stress.  Vascular  Dorsalis pedis and posterior tibial  pulses are palpable  bilaterally.  Capillary return is within normal limits  bilaterally. Temperature is within normal limits  bilaterally.  Neurologic  Senn-Weinstein monofilament wire test within normal limits  bilaterally. Muscle power within normal limits bilaterally.  Nails Thick disfigured discolored nails with subungual debris  from hallux to fifth toes bilaterally. No evidence of bacterial infection or drainage bilaterally.  Orthopedic  No limitations of motion  feet .  No crepitus or effusions noted.  No bony pathology or digital deformities noted.  HAV  B/L.  Skin  normotropic skin with no porokeratosis noted bilaterally.  No signs of infections or ulcers noted.     Onychomycosis  Pain in right toes  Pain in left toes  Consent was obtained for treatment procedures.   Mechanical debridement of nails 1-5  bilaterally performed with a nail nipper.  Filed with dremel without incident.    Return office visit   10 weeks   with Dr.  Donzetta Matters               Told patient to return for periodic foot care and evaluation due to potential at risk complications.   Helane Gunther DPM

## 2023-04-27 ENCOUNTER — Encounter: Payer: Self-pay | Admitting: *Deleted

## 2023-04-27 ENCOUNTER — Other Ambulatory Visit (HOSPITAL_COMMUNITY): Payer: Self-pay

## 2023-04-27 ENCOUNTER — Other Ambulatory Visit: Payer: Self-pay

## 2023-04-27 ENCOUNTER — Inpatient Hospital Stay: Payer: Medicare Other | Attending: Oncology | Admitting: Oncology

## 2023-04-27 ENCOUNTER — Telehealth: Payer: Self-pay

## 2023-04-27 ENCOUNTER — Telehealth: Payer: Self-pay | Admitting: Pharmacist

## 2023-04-27 VITALS — BP 129/69 | HR 68 | Temp 98.1°F | Resp 18 | Ht 68.0 in | Wt 141.5 lb

## 2023-04-27 DIAGNOSIS — C180 Malignant neoplasm of cecum: Secondary | ICD-10-CM

## 2023-04-27 DIAGNOSIS — Z86010 Personal history of colonic polyps: Secondary | ICD-10-CM

## 2023-04-27 DIAGNOSIS — C189 Malignant neoplasm of colon, unspecified: Secondary | ICD-10-CM

## 2023-04-27 DIAGNOSIS — Z8042 Family history of malignant neoplasm of prostate: Secondary | ICD-10-CM

## 2023-04-27 DIAGNOSIS — Z803 Family history of malignant neoplasm of breast: Secondary | ICD-10-CM | POA: Diagnosis not present

## 2023-04-27 DIAGNOSIS — Z808 Family history of malignant neoplasm of other organs or systems: Secondary | ICD-10-CM | POA: Diagnosis not present

## 2023-04-27 MED ORDER — CAPECITABINE 500 MG PO TABS
1500.0000 mg | ORAL_TABLET | Freq: Two times a day (BID) | ORAL | 0 refills | Status: DC
Start: 2023-04-27 — End: 2023-06-25
  Filled 2023-04-27: qty 84, 14d supply, fill #0
  Filled 2023-04-29: qty 84, 21d supply, fill #0

## 2023-04-27 MED ORDER — CAPECITABINE 500 MG PO TABS
ORAL_TABLET | ORAL | 0 refills | Status: DC
  Filled 2023-04-27: qty 84, fill #0

## 2023-04-27 NOTE — Progress Notes (Signed)
[facility] New Patient Consult   Requesting MD: Shelva Majestic, Md [address] [place],  [place] [address]   Chad Fisher 89 y.o.  02-07-1933    Reason for Consult: Colon cancer   HPI: Chad Fisher is followed by Dr. Durene Cal for internal medicine care.  A CBC on 12/18/2022 found the hemoglobin 11.7 with an MCV of 85.3.  Repeat CBCs in April and May confirmed mild anemia.  Hemoccults returned positive. He was referred to gastroenterology, but prior to being evaluated he developed rectal bleeding and presented to the emergency room on 04/04/2023. CTs of the chest, abdomen, pelvis on 04/06/2023 revealed a nonspecific 4 mm left lower lobe nodule, new compared to a CT from 11/14/2015.  Multiple nonobstructive renal calculi bilaterally.  Severe masslike thickening of the cecum is noted with apparent involvement of the ileocecal junction.  Prominent borderline enlarged mildly enlarged ileocolic lymph nodes were noted. He was taken to a colonoscopy by Dr. Marina Goodell on 04/07/2023.  A mass was noted in the right colon, possibly at the cecum.  The mass was biopsied and the area was tattooed.  A 5 mm transverse colon polyp was removed.  The pathology from the colon mass biopsy revealed moderately differentiated adenocarcinoma.  The transverse polyp returned as a tubular adenoma. Dr. Bedelia Person was consulted and he was taken to the operating room on 04/08/2023 for a right colectomy.  All palpable bulky mesenteric lymphadenopathy was included in the resection specimen.  An anastomosis was created between the terminal ileum and transverse colon.  He was discharged to home on 04/11/2023.  He reports he has bowel movements are more frequent following surgery.  The stool is loose.  He is eating.  He reports feeling completely well prior to the onset of rectal bleeding 2324  Past Medical History:  Diagnosis Date   Cancer [facility])    skin   Hernia 1987   right inguinal     .  History of kidney  stones  Past Surgical History:  Procedure Laterality Date   BIOPSY  04/06/2023   Procedure: BIOPSY;  Surgeon: Hilarie Fredrickson, MD;  Location: [facility];  Service: Gastroenterology;;   CATARACT EXTRACTION, BILATERAL     COLONOSCOPY N/A 04/06/2023   Procedure: COLONOSCOPY;  Surgeon: Hilarie Fredrickson, MD;  Location: [facility];  Service: Gastroenterology;  Laterality: N/A;   HERNIA REPAIR     LAPAROTOMY N/A 04/08/2023   Procedure: EXPLORATORY LAPAROTOMY WITH RIGHT HEMICOLECTOMY;  Surgeon: Diamantina Monks, MD;  Location: [facility];  Service: General;  Laterality: N/A;   MOHS SURGERY     left ear   POLYPECTOMY  04/06/2023   Procedure: POLYPECTOMY;  Surgeon: Hilarie Fredrickson, MD;  Location: [facility];  Service: Gastroenterology;;   SUBMUCOSAL TATTOO INJECTION  04/06/2023   Procedure: SUBMUCOSAL TATTOO INJECTION;  Surgeon: Hilarie Fredrickson, MD;  Location: [facility];  Service: Gastroenterology;;    Medications: Reviewed  Allergies: No Known Allergies  Family history: A daughter died of breast cancer at age 47, his son had renal cell carcinoma at age 20, a brother had prostate cancer  Social History:   He lives with his wife in [place].  He is retired from the [employer].  He does not use cigarettes or alcohol.  No transfusion history.  No risk factor for hepatitis.  ROS:   Positives include: 1 episode of rectal bleeding prior to the ER visit 04/04/2023, loose stool and frequent bowel movements following surgery  A  complete ROS was otherwise negative.  Physical Exam:  Blood pressure 129/69, pulse 68, temperature 98.1 F (36.7 C), temperature source Oral, resp. rate 18, height 5\' 8"  (1.727 m), weight 141 lb 8 oz (64.2 kg), SpO2 100%.  HEENT: Oropharynx without visible mass, neck without mass Lungs: Clear bilaterally Cardiac: Regular rate and rhythm Abdomen: No hepatosplenomegaly, no mass, nontender, healed surgical incision with Steri-Strips in place GU: Testes without mass, right  epididymal cyst? Vascular: No leg edema Lymph nodes: No cervical, supraclavicular, axillary, or inguinal nodes Neurologic: Alert and oriented, the motor exam appears intact in the upper and lower extremities bilaterally Skin: No rash Musculoskeletal: No spine tenderness   LAB:  CBC  Lab Results  Component Value Date   WBC 10.4 04/09/2023   HGB 9.4 (L) 04/09/2023   HCT 30.2 (L) 04/09/2023   MCV 83.9 04/09/2023   PLT 201 04/09/2023   NEUTROABS 3.7 04/04/2023        CMP  Lab Results  Component Value Date   NA 137 04/09/2023   K 3.8 04/09/2023   CL 104 04/09/2023   CO2 26 04/09/2023   GLUCOSE 160 (H) 04/09/2023   BUN 9 04/09/2023   CREATININE 1.01 04/09/2023   CALCIUM 8.0 (L) 04/09/2023   PROT 6.9 04/04/2023   ALBUMIN 3.4 (L) 04/04/2023   AST 25 04/04/2023   ALT 23 04/04/2023   ALKPHOS 108 04/04/2023   BILITOT 0.8 04/04/2023   GFRNONAA >60 04/09/2023   GFRAA (L) 12/10/2010    57        The eGFR has been calculated using the MDRD equation. This calculation has not been validated in all clinical situations. eGFR's persistently <60 mL/min signify possible Chronic Kidney Disease.     Lab Results  Component Value Date   CEA1 2.1 04/06/2023    Imaging: CT images from 04/06/2023 reviewed Chad Fisher and his family    Assessment/Plan:   Adenocarcinoma of the cecum, stage IIIb (pT3pN1b,cM0), status post a right colectomy 04/08/2023 Moderately differentiated adenocarcinoma, 3/24 lymph nodes, negative resection margins, no tumor deposits, no loss of mismatch repair protein expression, MSS Colonoscopy 04/07/2023-right colon mass-invasive moderately differentiated adenocarcinoma transverse colon polyp-tubular adenoma CTs 04/06/2023-cecum mass, ileocolic lymphadenopathy, 4 mm lingula nodule-nonspecific Colon polyps-tubular adenoma on colonoscopy 04/07/2023 History of kidney stones Family history of cancer-daughter with breast cancer, son with renal cell carcinoma,  brother with prostate cancer   Disposition:   Chad Fisher has been diagnosed with stage III colon cancer after presenting with GI bleeding.  I reviewed the prognosis, details of the surgical pathology report, and adjuvant treatment options with him today.  He has a significant chance of developing recurrent colorectal cancer over the next several years.  He is in excellent health and appears to be a candidate for adjuvant systemic therapy.  We discussed the expected benefit of adjuvant 5-fluorouracil based chemotherapy.  I recommend adjuvant capecitabine.  He agrees to proceed. We reviewed potential toxicities associated with capecitabine including chance of nausea, mucositis, diarrhea, alopecia, hematologic toxicity, infection, and bleeding.  We discussed the sun sensitivity, rash, hyperpigmentation, and hand/foot syndrome associated with capecitabine.  He agrees to proceed.  He will receive additional teaching from the cancer center pharmacist.  Chad Fisher appears to have recovered from surgery.  The plan is to initiate cycle 1 capecitabine on 05/17/2023.  He will return for an office and lab visit on 06/02/2023.  He will discontinue capecitabine and contact us if he develops diarrhea.  He does not appear to have  hereditary nonpolyposis colon cancer syndrome, but his family members are at increased risk of developing colorectal cancer and should receive appropriate screening.  Thornton Papas, MD  04/27/2023, 2:07 PM

## 2023-04-27 NOTE — Telephone Encounter (Signed)
Oral Oncology Patient Advocate Encounter  After completing a benefits investigation, prior authorization for Capecitabine is not required at this time through [employer].  Patient's copay is $21.65.     Ardeen Fillers, CPhT Oncology Pharmacy Patient Advocate  [facility]  [phone] (phone) [phone] (fax) 04/27/2023 3:38 PM

## 2023-04-27 NOTE — Telephone Encounter (Signed)
Clinical Pharmacist Practitioner Encounter   Received new prescription for Xeloda (capecitabine) for the treatment of colon cancer, planned duration of 6 months. Planned start 05/17/23.   BMP from 04/09/23 assessed, no relevant lab abnormalities. Prescription dose and frequency assessed.   Current medication list in Epic reviewed, no DDIs with capecitabine identified.  Evaluated chart and no patient barriers to medication adherence identified.   Prescription has been e-scribed to the [facility] for benefits analysis and approval.  Oral Oncology Clinic will continue to follow for insurance authorization, copayment issues, initial counseling and start date.   Remi Haggard, PharmD, BCPS, BCOP, CPP Hematology/Oncology Clinical Pharmacist Practitioner [facility] [phone]  04/27/2023 3:34 PM

## 2023-04-27 NOTE — Progress Notes (Signed)
PATIENT NAVIGATOR PROGRESS NOTE  Name: Chad Fisher Date: 04/27/2023 MRN: [id number]  DOB: 10-12-1933   Reason for visit:  Initial visit  Comments:  Met with [name] his wife and son during visit with Dr Truett Perna Referral made to dietician Referral made to SW Given written and verbal instructions for Capecitabine, with pill box and sample of Udderly Smooth extra care 20, he will start on 8/5 for first cycle Given Journeys notebook with disease specific information Given contact number to call with any questions or concerns    Time spent counseling/coordinating care: > 60 minutes

## 2023-04-29 ENCOUNTER — Other Ambulatory Visit (HOSPITAL_COMMUNITY): Payer: Self-pay

## 2023-04-29 NOTE — Telephone Encounter (Signed)
Patient successfully OnBoarded and drug education provided by pharmacist. Medication scheduled to be picked up on 04/30/23 at [facility] by patient. Patient also knows to call me at [phone] with any questions or concerns regarding any unexpected change in co-pay.    Ardeen Fillers, CPhT Oncology Pharmacy Patient Advocate  [facility]  [phone] (phone) [phone] (fax) 04/29/2023 10:45 AM

## 2023-04-29 NOTE — Telephone Encounter (Signed)
Clinical Pharmacist Practitioner Encounter   Patient plans on picking up medication from [facility] on 04/30/23, but he knows not to start until 05/17/23.   Patient Education I spoke with patient and his wife for overview of new oral chemotherapy medication: Xeloda (capecitabine) for the treatment of colon cancer, planned duration of 6 months. Planned start 05/17/23.   Counseled patient on administration, dosing, side effects, monitoring, drug-food interactions, safe handling, storage, and disposal. Patient will take 3 tablets (1,500 mg total) by mouth 2 (two) times daily after a meal. Take for 14 days, then hold for 7 days. Repeat every 21 days.   Side effects include but not limited to: diarrhea, hand-foot syndrome, mouth sores, edema, decreased wbc, fatigue, N/V Diarrhea: patient plans on picking up loperamide to have on hand to use as needed. They know to report if he is having 4 or more loose stools per day Hand-foot syndrome: office provide patient with Udderly Smooth Extra Care 20. He knows to report any skin changes on his hands or feet Mouth sores: patient will request magic mouthwash if needed.    Reviewed with patient importance of keeping a medication schedule and plan for any missed doses.  After discussion with patient no patient barriers to medication adherence identified.   The [facility] voiced understanding and appreciation. All questions answered. Medication handout and calendar provided.  Provided patient with Oral Chemotherapy Navigation Clinic phone number. Patient knows to call the office with questions or concerns. Oral Chemotherapy Navigation Clinic will continue to follow.  Remi Haggard, PharmD, BCPS, BCOP, CPP Hematology/Oncology Clinical Pharmacist Practitioner [facility] [phone]  04/29/2023 10:41 AM

## 2023-04-30 ENCOUNTER — Other Ambulatory Visit (HOSPITAL_COMMUNITY): Payer: Self-pay

## 2023-05-04 ENCOUNTER — Telehealth: Payer: Self-pay

## 2023-05-04 NOTE — Telephone Encounter (Signed)
CSW attempted to contact patient per the request of the Nurse Navigator per new patient protocol.  The vm was full and CSW could not leave a message.

## 2023-05-06 DIAGNOSIS — Z85828 Personal history of other malignant neoplasm of skin: Secondary | ICD-10-CM | POA: Diagnosis not present

## 2023-05-06 DIAGNOSIS — L570 Actinic keratosis: Secondary | ICD-10-CM | POA: Diagnosis not present

## 2023-05-06 DIAGNOSIS — L905 Scar conditions and fibrosis of skin: Secondary | ICD-10-CM | POA: Diagnosis not present

## 2023-05-06 DIAGNOSIS — L720 Epidermal cyst: Secondary | ICD-10-CM | POA: Diagnosis not present

## 2023-05-06 DIAGNOSIS — L821 Other seborrheic keratosis: Secondary | ICD-10-CM | POA: Diagnosis not present

## 2023-05-06 DIAGNOSIS — C44329 Squamous cell carcinoma of skin of other parts of face: Secondary | ICD-10-CM | POA: Diagnosis not present

## 2023-05-06 DIAGNOSIS — D485 Neoplasm of uncertain behavior of skin: Secondary | ICD-10-CM | POA: Diagnosis not present

## 2023-05-18 ENCOUNTER — Telehealth: Payer: Self-pay

## 2023-05-18 NOTE — Telephone Encounter (Addendum)
  The patient contacted the office with an inquiry for the provider. The patient is scheduled to undergo Mohs surgery on August 20th and is seeking clarification on how the surgery may affect their Xeloda treatment. As per Dr. Kalman Drape recommendation, the patient should refrain from undergoing Mohs surgery due to potential complications. The patient has verbally acknowledged this and will discuss alternative options with the provider on August 21st.

## 2023-05-19 ENCOUNTER — Telehealth: Payer: Self-pay | Admitting: *Deleted

## 2023-05-19 NOTE — Telephone Encounter (Signed)
Dr. 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  Cell #[phone]

## 2023-05-20 ENCOUNTER — Other Ambulatory Visit (HOSPITAL_COMMUNITY): Payer: Self-pay

## 2023-05-24 ENCOUNTER — Encounter: Payer: Self-pay | Admitting: *Deleted

## 2023-05-24 NOTE — Progress Notes (Signed)
PATIENT NAVIGATOR PROGRESS NOTE  Name: Chad Fisher Date: 05/24/2023 MRN: [id number]  DOB: Feb 11, 1933   Reason for visit:  Telephone call  Comments:  Spoke with Mr and Mrs Cheairs regarding several side effects that he is experiencing since starting Capecitabine last Monday. Today would be starting Cycle 1 week 2  He describes a facial flushing that has improved slightly since the weekend 2. Chapped lips-states that he does not have mouth sores, just chapped lips, reviewed using non alcohol mouthwashes and increasing hydration 3. Rash over top of hands up to elbows, not itchy but splotchy. States that he does not have shortness of breath, tightness in throat.  4. Excessive fatigue 5. One diarrhea stool this am.   Reviewed with Dr Truett Perna and pt will come in for clinic visit with Lonna Cobb NP on 05/26/23 and will hold doses of Capecitabine until then.  Verbalized understanding and appt made and reviewed    Time spent counseling/coordinating care: 45-60 minutes

## 2023-05-25 ENCOUNTER — Other Ambulatory Visit: Payer: Self-pay

## 2023-05-26 ENCOUNTER — Inpatient Hospital Stay: Payer: Medicare Other | Attending: Oncology | Admitting: Nurse Practitioner

## 2023-05-26 ENCOUNTER — Encounter: Payer: Self-pay | Admitting: Nurse Practitioner

## 2023-05-26 VITALS — BP 130/69 | HR 69 | Temp 97.9°F | Resp 18 | Ht 68.0 in | Wt 139.8 lb

## 2023-05-26 DIAGNOSIS — K1230 Oral mucositis (ulcerative), unspecified: Secondary | ICD-10-CM | POA: Insufficient documentation

## 2023-05-26 DIAGNOSIS — Z8042 Family history of malignant neoplasm of prostate: Secondary | ICD-10-CM | POA: Diagnosis not present

## 2023-05-26 DIAGNOSIS — C189 Malignant neoplasm of colon, unspecified: Secondary | ICD-10-CM

## 2023-05-26 DIAGNOSIS — K1379 Other lesions of oral mucosa: Secondary | ICD-10-CM | POA: Diagnosis not present

## 2023-05-26 DIAGNOSIS — Z8051 Family history of malignant neoplasm of kidney: Secondary | ICD-10-CM | POA: Diagnosis not present

## 2023-05-26 DIAGNOSIS — Z9049 Acquired absence of other specified parts of digestive tract: Secondary | ICD-10-CM | POA: Diagnosis not present

## 2023-05-26 DIAGNOSIS — C180 Malignant neoplasm of cecum: Secondary | ICD-10-CM | POA: Diagnosis not present

## 2023-05-26 DIAGNOSIS — D123 Benign neoplasm of transverse colon: Secondary | ICD-10-CM | POA: Diagnosis not present

## 2023-05-26 DIAGNOSIS — Z79899 Other long term (current) drug therapy: Secondary | ICD-10-CM | POA: Insufficient documentation

## 2023-05-26 DIAGNOSIS — Z87442 Personal history of urinary calculi: Secondary | ICD-10-CM | POA: Insufficient documentation

## 2023-05-26 DIAGNOSIS — R21 Rash and other nonspecific skin eruption: Secondary | ICD-10-CM | POA: Diagnosis not present

## 2023-05-26 DIAGNOSIS — Z803 Family history of malignant neoplasm of breast: Secondary | ICD-10-CM | POA: Diagnosis not present

## 2023-05-26 NOTE — Progress Notes (Signed)
  [facility] OFFICE PROGRESS NOTE   Diagnosis: Colon cancer  INTERVAL HISTORY:   [name] returns for follow-up.  He began cycle 1 Xeloda 05/17/2023.  He contacted the office 05/24/2023 following the morning dose to report a rash on his face and forearms.  Xeloda was placed on hold.  Lips feel swollen.  No redness or pain on the palms or soles.  Yesterday he had 3 soft looser bowel movements, not watery.  He took 2 Imodium last night.  His wife thinks the erythema over the face and forearms has improved.  Objective:  Vital signs in last 24 hours:  Blood pressure 130/69, pulse 69, temperature 97.9 F (36.6 C), temperature source Oral, resp. rate 18, height 5\' 8"  (1.727 m), weight 139 lb 12.8 oz (63.4 kg), SpO2 100%.    HEENT: No thrush.  2 areas of ulceration along the lower lip.  A few areas of erythema scattered over the buccal mucosa. Resp: Lungs clear bilaterally. Cardio: Regular rate and rhythm. GI: No hepatosplenomegaly. Vascular: No leg edema. Skin: Palms and soles without erythema.  Face and forearms with erythema.   Lab Results:  Lab Results  Component Value Date   WBC 10.4 04/09/2023   HGB 9.4 (L) 04/09/2023   HCT 30.2 (L) 04/09/2023   MCV 83.9 04/09/2023   PLT 201 04/09/2023   NEUTROABS 3.7 04/04/2023    Imaging:  No results found.  Medications: I have reviewed the patient's current medications.  Assessment/Plan: Adenocarcinoma of the cecum, stage IIIb (pT3pN1b,cM0), status post a right colectomy 04/08/2023 Moderately differentiated adenocarcinoma, 3/24 lymph nodes, negative resection margins, no tumor deposits, no loss of mismatch repair protein expression, MSS Colonoscopy 04/07/2023-right colon mass-invasive moderately differentiated adenocarcinoma transverse colon polyp-tubular adenoma CTs 04/06/2023-cecum mass, ileocolic lymphadenopathy, 4 mm lingula nodule-nonspecific Cycle 1 Xeloda 05/17/2023, placed on hold 05/24/2023 following the morning dose  due to skin toxicity and mucositis Colon polyps-tubular adenoma on colonoscopy 04/07/2023 History of kidney stones Family history of cancer-daughter with breast cancer, son with renal cell carcinoma, brother with prostate cancer    Disposition: Mr. Shetty appears stable.  He began cycle 1 Xeloda beginning 05/17/2023.  Xeloda was placed on hold beginning 05/24/2023 due to a rash on the forearms and face.  He also has a few blistery type lesions on the lower lip.  He and his wife understand the rash and mucositis are related to Xeloda.  He will continue to hold Xeloda and return for follow-up in 1 week.  We will discuss a dose reduction at that time.  He will contact the office in the interim if symptoms worsen.  Patient seen with Dr. Truett Perna.    Lonna Cobb ANP/GNP-BC   05/26/2023  2:05 PM  This was a shared visit with Lonna Cobb.  Mr. Donata Clay was interviewed and examined.  He developed 5-FU skin toxicity cycle 1 capecitabine.  Capecitabine will remain on hold.  He will return for a follow-up visit next week.  The capecitabine will be dose reduced with cycle 2.  I discussed the case with Dr. Irene Limbo from dermatology.  The plan is to proceed with Mohs surgery next week for management of a forehead squamous cell carcinoma   Mancel Bale, MD

## 2023-05-27 ENCOUNTER — Other Ambulatory Visit: Payer: Self-pay

## 2023-06-01 DIAGNOSIS — C44329 Squamous cell carcinoma of skin of other parts of face: Secondary | ICD-10-CM | POA: Diagnosis not present

## 2023-06-02 ENCOUNTER — Inpatient Hospital Stay: Payer: Medicare Other | Admitting: Oncology

## 2023-06-02 ENCOUNTER — Inpatient Hospital Stay: Payer: Medicare Other

## 2023-06-02 ENCOUNTER — Other Ambulatory Visit: Payer: Self-pay

## 2023-06-02 ENCOUNTER — Inpatient Hospital Stay: Payer: Medicare Other | Admitting: Nutrition

## 2023-06-02 VITALS — BP 124/64 | HR 62 | Temp 98.2°F | Resp 18 | Ht 68.0 in | Wt 139.6 lb

## 2023-06-02 DIAGNOSIS — Z79899 Other long term (current) drug therapy: Secondary | ICD-10-CM | POA: Diagnosis not present

## 2023-06-02 DIAGNOSIS — K1379 Other lesions of oral mucosa: Secondary | ICD-10-CM | POA: Diagnosis not present

## 2023-06-02 DIAGNOSIS — D123 Benign neoplasm of transverse colon: Secondary | ICD-10-CM | POA: Diagnosis not present

## 2023-06-02 DIAGNOSIS — C189 Malignant neoplasm of colon, unspecified: Secondary | ICD-10-CM

## 2023-06-02 DIAGNOSIS — K1230 Oral mucositis (ulcerative), unspecified: Secondary | ICD-10-CM | POA: Diagnosis not present

## 2023-06-02 DIAGNOSIS — Z9049 Acquired absence of other specified parts of digestive tract: Secondary | ICD-10-CM | POA: Diagnosis not present

## 2023-06-02 DIAGNOSIS — R21 Rash and other nonspecific skin eruption: Secondary | ICD-10-CM | POA: Diagnosis not present

## 2023-06-02 DIAGNOSIS — Z87442 Personal history of urinary calculi: Secondary | ICD-10-CM | POA: Diagnosis not present

## 2023-06-02 DIAGNOSIS — C180 Malignant neoplasm of cecum: Secondary | ICD-10-CM | POA: Diagnosis not present

## 2023-06-02 DIAGNOSIS — Z8051 Family history of malignant neoplasm of kidney: Secondary | ICD-10-CM | POA: Diagnosis not present

## 2023-06-02 DIAGNOSIS — Z803 Family history of malignant neoplasm of breast: Secondary | ICD-10-CM | POA: Diagnosis not present

## 2023-06-02 LAB — CBC WITH DIFFERENTIAL (CANCER CENTER ONLY)
Abs Immature Granulocytes: 0.01 10*3/uL (ref 0.00–0.07)
Basophils Absolute: 0 10*3/uL (ref 0.0–0.1)
Basophils Relative: 1 %
Eosinophils Absolute: 0.5 10*3/uL (ref 0.0–0.5)
Eosinophils Relative: 12 %
HCT: 30.3 % — ABNORMAL LOW (ref 39.0–52.0)
Hemoglobin: 9.5 g/dL — ABNORMAL LOW (ref 13.0–17.0)
Immature Granulocytes: 0 %
Lymphocytes Relative: 24 %
Lymphs Abs: 1.1 10*3/uL (ref 0.7–4.0)
MCH: 26.1 pg (ref 26.0–34.0)
MCHC: 31.4 g/dL (ref 30.0–36.0)
MCV: 83.2 fL (ref 80.0–100.0)
Monocytes Absolute: 0.4 10*3/uL (ref 0.1–1.0)
Monocytes Relative: 10 %
Neutro Abs: 2.4 10*3/uL (ref 1.7–7.7)
Neutrophils Relative %: 53 %
Platelet Count: 152 10*3/uL (ref 150–400)
RBC: 3.64 MIL/uL — ABNORMAL LOW (ref 4.22–5.81)
RDW: 16.1 % — ABNORMAL HIGH (ref 11.5–15.5)
WBC Count: 4.4 10*3/uL (ref 4.0–10.5)
nRBC: 0 % (ref 0.0–0.2)

## 2023-06-02 LAB — CMP (CANCER CENTER ONLY)
ALT: 7 U/L (ref 0–44)
AST: 15 U/L (ref 15–41)
Albumin: 3.7 g/dL (ref 3.5–5.0)
Alkaline Phosphatase: 56 U/L (ref 38–126)
Anion gap: 5 (ref 5–15)
BUN: 18 mg/dL (ref 8–23)
CO2: 29 mmol/L (ref 22–32)
Calcium: 8.5 mg/dL — ABNORMAL LOW (ref 8.9–10.3)
Chloride: 105 mmol/L (ref 98–111)
Creatinine: 1.05 mg/dL (ref 0.61–1.24)
GFR, Estimated: >60 mL/min (ref >60)
Glucose, Bld: 104 mg/dL — ABNORMAL HIGH (ref 70–99)
Potassium: 4 mmol/L (ref 3.5–5.1)
Sodium: 139 mmol/L (ref 135–145)
Total Bilirubin: 0.7 mg/dL (ref 0.3–1.2)
Total Protein: 6.1 g/dL — ABNORMAL LOW (ref 6.5–8.1)

## 2023-06-02 NOTE — Progress Notes (Signed)
  [facility] OFFICE PROGRESS NOTE   Diagnosis: Colon cancer  INTERVAL HISTORY:   Chad Fisher returns as scheduled.  He reports the skin over his head and arms is peeling.  Mouth sores have improved.  He underwent Mohs surgery at the forehead yesterday.  No new complaint.  Objective:  Vital signs in last 24 hours:  Blood pressure 124/64, pulse 62, temperature 98.2 F (36.8 C), temperature source Oral, resp. rate 18, height 5\' 8"  (1.727 m), weight 139 lb 9.6 oz (63.3 kg), SpO2 100%.    HEENT: No thrush, ulceration at the lower lip has almost completely healed.  No mouth ulcers. Resp: Lungs clear bilaterally Cardio: Regular rate and rhythm GI: Nontender, no hepatosplenomegaly Vascular: No leg edema  Skin: Dry desquamation at the neck and bilateral forearms.  Hyperpigmentation over the face, bandage in place at the right forehead.  Palms without erythema  Lab Results:  Lab Results  Component Value Date   WBC 4.4 06/02/2023   HGB 9.5 (L) 06/02/2023   HCT 30.3 (L) 06/02/2023   MCV 83.2 06/02/2023   PLT 152 06/02/2023   NEUTROABS 2.4 06/02/2023    CMP  Lab Results  Component Value Date   NA 139 06/02/2023   K 4.0 06/02/2023   CL 105 06/02/2023   CO2 29 06/02/2023   GLUCOSE 104 (H) 06/02/2023   BUN 18 06/02/2023   CREATININE 1.05 06/02/2023   CALCIUM 8.5 (L) 06/02/2023   PROT 6.1 (L) 06/02/2023   ALBUMIN 3.7 06/02/2023   AST 15 06/02/2023   ALT 7 06/02/2023   ALKPHOS 56 06/02/2023   BILITOT 0.7 06/02/2023   GFRNONAA >60 06/02/2023   GFRAA (L) 12/10/2010    57        The eGFR has been calculated using the MDRD equation. This calculation has not been validated in all clinical situations. eGFR's persistently <60 mL/min signify possible Chronic Kidney Disease.    Lab Results  Component Value Date   CEA1 2.1 04/06/2023      Medications: I have reviewed the patient's current medications.   Assessment/Plan: Adenocarcinoma of the cecum, stage  IIIb (pT3pN1b,cM0), status post a right colectomy 04/08/2023 Moderately differentiated adenocarcinoma, 3/24 lymph nodes, negative resection margins, no tumor deposits, no loss of mismatch repair protein expression, MSS Colonoscopy 04/07/2023-right colon mass-invasive moderately differentiated adenocarcinoma transverse colon polyp-tubular adenoma CTs 04/06/2023-cecum mass, ileocolic lymphadenopathy, 4 mm lingula nodule-nonspecific Cycle 1 Xeloda 05/17/2023, placed on hold 05/24/2023 following the morning dose due to skin toxicity and mucositis Colon polyps-tubular adenoma on colonoscopy 04/07/2023 History of kidney stones Family history of cancer-daughter with breast cancer, son with renal cell carcinoma, brother with prostate cancer     Disposition: Chad Fisher developed mucositis and skin toxicity during cycle 1 of adjuvant Xeloda.  The mucositis and skin changes are improving.  He will return for a repeat evaluation on 06/07/2023 prior to deciding on further Xeloda.  The Xeloda will be dose reduced if we decide to continue adjuvant chemotherapy.  Thornton Papas, MD  06/02/2023  9:09 AM

## 2023-06-02 NOTE — Progress Notes (Signed)
89 year old male diagnosed with Colon Cancer and followed by Dr. Truett Perna.  Patient's Xeloda is on hold due to skin toxicity after first cycle.  PMH includes Kidney Stones.  Medications include Synthroid and Vitamin B 12.  Labs include Glucose 104.  Height: 5'8". Weight: 139 pounds 10 oz. Aug 21. UBW: 155 pounds per patient. 150 pounds in January 2024. BMI: 21.23.  Patient reports he has never been a big eater. He reports a 15 pound wt loss from his usual body weight which he attributes to poor appetite and a 1 week stay in the hospital. He denies nutrition impact symptoms currently. He had a little diarrhea after cycle 1 Xeloda.  He eats very small amounts such as one egg or a piece of toast with jelly for breakfast. Sometimes he will have a waffle or corned beef hash. He loves mandarin oranges and wants to continue to eat these every morning.  Nutrition Diagnosis: Unintended wt loss related to colon cancer and poor appetite as evidenced by 7% loss over 7 months which is clinically not significant but concerning.  Intervention: Educated to continue small frequent meals and snacks. Continue ONS. Change to Ensure Plus or Equivalent and consume BID between meals. Provided coupons. Add high calorie, high protein foods. Provided nutrition fact sheets and recipes. Educated on low fiber diet if he develops diarrhea. Provided nutrition fact sheet. Contact information given and patient encouraged to call with questions.  Monitoring, Evaluation, Goals: Patient will tolerate increased calories and protein to promote wt stabilization/gain.  No follow up scheduled. Please consult RD as needed.

## 2023-06-03 ENCOUNTER — Other Ambulatory Visit (HOSPITAL_COMMUNITY): Payer: Self-pay

## 2023-06-07 ENCOUNTER — Ambulatory Visit: Payer: Medicare Other | Admitting: Podiatry

## 2023-06-07 ENCOUNTER — Inpatient Hospital Stay: Payer: Medicare Other | Admitting: Oncology

## 2023-06-07 VITALS — BP 117/62 | HR 67 | Temp 98.1°F | Resp 18 | Ht 68.0 in | Wt 141.1 lb

## 2023-06-07 DIAGNOSIS — D123 Benign neoplasm of transverse colon: Secondary | ICD-10-CM | POA: Diagnosis not present

## 2023-06-07 DIAGNOSIS — K1230 Oral mucositis (ulcerative), unspecified: Secondary | ICD-10-CM | POA: Diagnosis not present

## 2023-06-07 DIAGNOSIS — C189 Malignant neoplasm of colon, unspecified: Secondary | ICD-10-CM | POA: Diagnosis not present

## 2023-06-07 DIAGNOSIS — C180 Malignant neoplasm of cecum: Secondary | ICD-10-CM | POA: Diagnosis not present

## 2023-06-07 DIAGNOSIS — Z87442 Personal history of urinary calculi: Secondary | ICD-10-CM | POA: Diagnosis not present

## 2023-06-07 DIAGNOSIS — Z9049 Acquired absence of other specified parts of digestive tract: Secondary | ICD-10-CM | POA: Diagnosis not present

## 2023-06-07 DIAGNOSIS — Z79899 Other long term (current) drug therapy: Secondary | ICD-10-CM | POA: Diagnosis not present

## 2023-06-07 DIAGNOSIS — R21 Rash and other nonspecific skin eruption: Secondary | ICD-10-CM | POA: Diagnosis not present

## 2023-06-07 DIAGNOSIS — Z803 Family history of malignant neoplasm of breast: Secondary | ICD-10-CM | POA: Diagnosis not present

## 2023-06-07 DIAGNOSIS — Z8051 Family history of malignant neoplasm of kidney: Secondary | ICD-10-CM | POA: Diagnosis not present

## 2023-06-07 DIAGNOSIS — K1379 Other lesions of oral mucosa: Secondary | ICD-10-CM | POA: Diagnosis not present

## 2023-06-07 NOTE — Progress Notes (Signed)
   [facility] OFFICE PROGRESS NOTE   Diagnosis: Colon cancer  INTERVAL HISTORY:   Chad Fisher is as scheduled.  The lip ulcers have healed.  The erythema over the forearms has improved.  He has dryness of the arms.  No other complaint.  Objective:  Vital signs in last 24 hours:  Blood pressure 117/62, pulse 67, temperature 98.1 F (36.7 C), temperature source Oral, resp. rate 18, height 5\' 8"  (1.727 m), weight 141 lb 1.6 oz (64 kg), SpO2 98%.    HEENT: No thrush or ulcers Resp: Lungs clear bilaterally Cardio: Regular rate and rhythm GI: Nontender, no hepatosplenomegaly Vascular: No leg edema  Skin: Erythema with superficial dry desquamation over the forearms bilaterally, face without erythema, Palms without erythema, dressing in place at the right forehead   Lab Results:  Lab Results  Component Value Date   WBC 4.4 06/02/2023   HGB 9.5 (L) 06/02/2023   HCT 30.3 (L) 06/02/2023   MCV 83.2 06/02/2023   PLT 152 06/02/2023   NEUTROABS 2.4 06/02/2023    CMP  Lab Results  Component Value Date   NA 139 06/02/2023   K 4.0 06/02/2023   CL 105 06/02/2023   CO2 29 06/02/2023   GLUCOSE 104 (H) 06/02/2023   BUN 18 06/02/2023   CREATININE 1.05 06/02/2023   CALCIUM 8.5 (L) 06/02/2023   PROT 6.1 (L) 06/02/2023   ALBUMIN 3.7 06/02/2023   AST 15 06/02/2023   ALT 7 06/02/2023   ALKPHOS 56 06/02/2023   BILITOT 0.7 06/02/2023   GFRNONAA >60 06/02/2023   GFRAA (L) 12/10/2010    57        The eGFR has been calculated using the MDRD equation. This calculation has not been validated in all clinical situations. eGFR's persistently <60 mL/min signify possible Chronic Kidney Disease.    Lab Results  Component Value Date   CEA1 2.1 04/06/2023      Medications: I have reviewed the patient's current medications.   Assessment/Plan: Adenocarcinoma of the cecum, stage IIIb (pT3pN1b,cM0), status post a right colectomy 04/08/2023 Moderately differentiated  adenocarcinoma, 3/24 lymph nodes, negative resection margins, no tumor deposits, no loss of mismatch repair protein expression, MSS Colonoscopy 04/07/2023-right colon mass-invasive moderately differentiated adenocarcinoma transverse colon polyp-tubular adenoma CTs 04/06/2023-cecum mass, ileocolic lymphadenopathy, 4 mm lingula nodule-nonspecific Cycle 1 Xeloda 05/17/2023, placed on hold 05/24/2023 following the morning dose due to skin toxicity and mucositis Cycle 2 Xeloda 06/08/2023, Xeloda dose reduced to 500 mg twice daily Colon polyps-tubular adenoma on colonoscopy 04/07/2023 History of kidney stones Family history of cancer-daughter with breast cancer, son with renal cell carcinoma, brother with prostate cancer Skin toxicity and mucositis secondary to Xeloda with cycle 1, dose reduced with cycle 2      Disposition: Chad Fisher is a history of stage III colon cancer.  He completed a partial cycle of Xeloda.  Xeloda was discontinued secondary to skin toxicity and mucositis.  The mucositis has resolved and the forearm erythema is proved.  He agrees to proceed with cycle 2 Xeloda beginning 06/08/2023 with a dose reduction.  He will discontinue Xeloda and contact us if he develops mouth sores or increased skin erythema.  Chad Fisher will return for an office visit in 3 weeks.  He has persistent anemia, likely secondary to the previous diagnosis of colon cancer/bleeding and chemotherapy.  Will check a ferritin level when he returns in 3 weeks.  Thornton Papas, MD  06/07/2023  3:26 PM

## 2023-06-09 ENCOUNTER — Other Ambulatory Visit: Payer: Self-pay

## 2023-06-11 ENCOUNTER — Other Ambulatory Visit (HOSPITAL_COMMUNITY): Payer: Self-pay

## 2023-06-16 ENCOUNTER — Telehealth: Payer: Self-pay | Admitting: Family Medicine

## 2023-06-16 NOTE — Telephone Encounter (Signed)
Returned the call and spoke with pharmacist Hina P and gave the ok to switch manufactures.

## 2023-06-16 NOTE — Telephone Encounter (Signed)
Joelle with [employer] requests to be called re: Change of manufacturer for Levothyroxine.  Requests verbal approval for manufacture change.   Reference# [id number]

## 2023-06-21 ENCOUNTER — Ambulatory Visit: Payer: Medicare Other | Admitting: Podiatry

## 2023-06-23 ENCOUNTER — Other Ambulatory Visit (HOSPITAL_COMMUNITY): Payer: Self-pay

## 2023-06-25 ENCOUNTER — Inpatient Hospital Stay: Payer: Medicare Other | Attending: Oncology

## 2023-06-25 ENCOUNTER — Other Ambulatory Visit: Payer: Self-pay

## 2023-06-25 ENCOUNTER — Other Ambulatory Visit (HOSPITAL_COMMUNITY): Payer: Self-pay

## 2023-06-25 ENCOUNTER — Encounter: Payer: Self-pay | Admitting: Nurse Practitioner

## 2023-06-25 ENCOUNTER — Inpatient Hospital Stay: Payer: Medicare Other | Admitting: Nurse Practitioner

## 2023-06-25 VITALS — BP 113/60 | HR 70 | Temp 98.1°F | Resp 18 | Ht 68.0 in | Wt 140.0 lb

## 2023-06-25 DIAGNOSIS — C180 Malignant neoplasm of cecum: Secondary | ICD-10-CM | POA: Diagnosis not present

## 2023-06-25 DIAGNOSIS — C189 Malignant neoplasm of colon, unspecified: Secondary | ICD-10-CM

## 2023-06-25 LAB — CBC WITH DIFFERENTIAL (CANCER CENTER ONLY)
Abs Immature Granulocytes: 0.02 10*3/uL (ref 0.00–0.07)
Basophils Absolute: 0.1 10*3/uL (ref 0.0–0.1)
Basophils Relative: 1 %
Eosinophils Absolute: 0.3 10*3/uL (ref 0.0–0.5)
Eosinophils Relative: 4 %
HCT: 28.9 % — ABNORMAL LOW (ref 39.0–52.0)
Hemoglobin: 9.1 g/dL — ABNORMAL LOW (ref 13.0–17.0)
Immature Granulocytes: 0 %
Lymphocytes Relative: 26 %
Lymphs Abs: 1.7 10*3/uL (ref 0.7–4.0)
MCH: 26.3 pg (ref 26.0–34.0)
MCHC: 31.5 g/dL (ref 30.0–36.0)
MCV: 83.5 fL (ref 80.0–100.0)
Monocytes Absolute: 0.6 10*3/uL (ref 0.1–1.0)
Monocytes Relative: 10 %
Neutro Abs: 3.7 10*3/uL (ref 1.7–7.7)
Neutrophils Relative %: 59 %
Platelet Count: 169 10*3/uL (ref 150–400)
RBC: 3.46 MIL/uL — ABNORMAL LOW (ref 4.22–5.81)
RDW: 19 % — ABNORMAL HIGH (ref 11.5–15.5)
WBC Count: 6.3 10*3/uL (ref 4.0–10.5)
nRBC: 0 % (ref 0.0–0.2)

## 2023-06-25 LAB — CMP (CANCER CENTER ONLY)
ALT: 20 U/L (ref 0–44)
AST: 42 U/L — ABNORMAL HIGH (ref 15–41)
Albumin: 4.1 g/dL (ref 3.5–5.0)
Alkaline Phosphatase: 68 U/L (ref 38–126)
Anion gap: 7 (ref 5–15)
BUN: 20 mg/dL (ref 8–23)
CO2: 28 mmol/L (ref 22–32)
Calcium: 9.2 mg/dL (ref 8.9–10.3)
Chloride: 107 mmol/L (ref 98–111)
Creatinine: 1.2 mg/dL (ref 0.61–1.24)
GFR, Estimated: 58 mL/min — ABNORMAL LOW (ref >60)
Glucose, Bld: 126 mg/dL — ABNORMAL HIGH (ref 70–99)
Potassium: 4.2 mmol/L (ref 3.5–5.1)
Sodium: 142 mmol/L (ref 135–145)
Total Bilirubin: 1.1 mg/dL (ref 0.3–1.2)
Total Protein: 6.8 g/dL (ref 6.5–8.1)

## 2023-06-25 LAB — FERRITIN: Ferritin: 7 ng/mL — ABNORMAL LOW (ref 24–336)

## 2023-06-25 MED ORDER — CAPECITABINE 500 MG PO TABS
500.0000 mg | ORAL_TABLET | Freq: Two times a day (BID) | ORAL | 0 refills | Status: DC
  Filled 2023-06-25 – 2023-06-28 (×2): qty 28, 14d supply, fill #0

## 2023-06-25 NOTE — Progress Notes (Signed)
  Chad Fisher OFFICE PROGRESS NOTE   Diagnosis: Colon cancer  INTERVAL HISTORY:   Chad Fisher returns as scheduled.  He completed cycle 2 adjuvant Xeloda beginning 06/08/2023.  The dose was reduced due to skin toxicity and mucositis following cycle 1.  No change in the rash over his forearms.  Rash over his face has resolved.  No hand or foot pain or redness.  No mouth sores.  Objective:  Vital signs in last 24 hours:  Blood pressure 113/60, pulse 70, temperature 98.1 F (36.7 C), temperature source Oral, resp. rate 18, height 5\' 8"  (1.727 m), weight 140 lb (63.5 kg), SpO2 100%.    HEENT: No thrush or ulcers. Resp: Lungs clear bilaterally. Cardio: Regular rate and rhythm. GI: No hepatosplenomegaly. Vascular: No leg edema.  Skin: Faint erythema over both forearms.  Face without erythema.   Lab Results:  Lab Results  Component Value Date   WBC 6.3 06/25/2023   HGB 9.1 (L) 06/25/2023   HCT 28.9 (L) 06/25/2023   MCV 83.5 06/25/2023   PLT 169 06/25/2023   NEUTROABS 3.7 06/25/2023    Imaging:  No results found.  Medications: I have reviewed the patient's current medications.  Assessment/Plan: Adenocarcinoma of the cecum, stage IIIb (pT3pN1b,cM0), status post a right colectomy 04/08/2023 Moderately differentiated adenocarcinoma, 3/24 lymph nodes, negative resection margins, no tumor deposits, no loss of mismatch repair protein expression, MSS Colonoscopy 04/07/2023-right colon mass-invasive moderately differentiated adenocarcinoma transverse colon polyp-tubular adenoma CTs 04/06/2023-cecum mass, ileocolic lymphadenopathy, 4 mm lingula nodule-nonspecific Cycle 1 Xeloda 05/17/2023, placed on hold 05/24/2023 following the morning dose due to skin toxicity and mucositis Cycle 2 Xeloda 06/08/2023, Xeloda dose reduced to 500 mg twice daily Cycle 3 Xeloda 06/29/2023, Xeloda 500 mg twice daily Colon polyps-tubular adenoma on colonoscopy 04/07/2023 History of kidney  stones Family history of cancer-daughter with breast cancer, son with renal cell carcinoma, brother with prostate cancer Skin toxicity and mucositis secondary to Xeloda with cycle 1, dose reduced with cycle 2    Disposition: Chad Fisher appears stable.  He has completed 2 cycles of adjuvant Xeloda.  Xeloda was dose reduced with cycle 2 due to skin toxicity and mucositis.  He tolerated cycle 2 well.  He did not develop mucositis.  He continues to have mild erythema over the forearms, stable to improved.  We mutually decided to continue Xeloda at the same reduced dose with cycle 3.  He will begin cycle 3 Xeloda as scheduled beginning 06/29/2023.  He understands to contact the office if he develops mouth sores or increased skin erythema.  CBC and chemistry panel reviewed.  Labs adequate to proceed as above.  Ferritin returned at 7.  He will begin ferrous sulfate 325 mg daily.  He will increase to twice daily if he tolerates without constipation.  He will return for lab and follow-up in 3 weeks.  We are available to see him sooner if needed.  Lonna Cobb ANP/GNP-BC   06/25/2023  3:23 PM

## 2023-06-28 ENCOUNTER — Other Ambulatory Visit: Payer: Self-pay

## 2023-06-28 ENCOUNTER — Other Ambulatory Visit (HOSPITAL_COMMUNITY): Payer: Self-pay

## 2023-06-28 ENCOUNTER — Ambulatory Visit: Payer: Medicare Other | Admitting: Podiatry

## 2023-06-28 DIAGNOSIS — M79674 Pain in right toe(s): Secondary | ICD-10-CM

## 2023-06-28 DIAGNOSIS — M79675 Pain in left toe(s): Secondary | ICD-10-CM

## 2023-06-28 DIAGNOSIS — B351 Tinea unguium: Secondary | ICD-10-CM

## 2023-06-28 NOTE — Progress Notes (Signed)
Subjective:  Patient ID: Chad Fisher, male    DOB: 11-Nov-1932,  MRN: [id number]  Chad Fisher presents to clinic today for: Patient notes nails are thick, discolored, elongated and painful in shoegear when trying to ambulate.    PCP is Shelva Majestic, MD.  Past Medical History:  Diagnosis Date   Cancer [facility])    skin   Hernia 1987   right inguinal     Past Surgical History:  Procedure Laterality Date   BIOPSY  04/06/2023   Procedure: BIOPSY;  Surgeon: Hilarie Fredrickson, MD;  Location: [facility];  Service: Gastroenterology;;   CATARACT EXTRACTION, BILATERAL     COLONOSCOPY N/A 04/06/2023   Procedure: COLONOSCOPY;  Surgeon: Hilarie Fredrickson, MD;  Location: [facility];  Service: Gastroenterology;  Laterality: N/A;   HERNIA REPAIR     LAPAROTOMY N/A 04/08/2023   Procedure: EXPLORATORY LAPAROTOMY WITH RIGHT HEMICOLECTOMY;  Surgeon: Diamantina Monks, MD;  Location: [facility];  Service: General;  Laterality: N/A;   MOHS SURGERY     left ear   POLYPECTOMY  04/06/2023   Procedure: POLYPECTOMY;  Surgeon: Hilarie Fredrickson, MD;  Location: [facility];  Service: Gastroenterology;;   SUBMUCOSAL TATTOO INJECTION  04/06/2023   Procedure: SUBMUCOSAL TATTOO INJECTION;  Surgeon: Hilarie Fredrickson, MD;  Location: [facility];  Service: Gastroenterology;;    No Known Allergies  Review of Systems: Negative except as noted in the HPI.  Objective:  There were no vitals filed for this visit.  Chad Fisher is a pleasant 89 y.o. male in NAD. AAO x 3.  Vascular Examination: Capillary refill time is 3-5 seconds to toes bilateral. Palpable pedal pulses b/l LE. Digital hair present b/l.  Skin temperature gradient WNL b/l. No varicosities b/l. No cyanosis noted b/l.   Dermatological Examination: Pedal skin with normal turgor, texture and tone b/l. No open wounds. No interdigital macerations b/l. Toenails x10 are 3mm thick, discolored, dystrophic with subungual debris. There is pain with compression of  the nail plates.  They are elongated x10     Latest Ref Rng & Units 04/08/2023    3:58 AM  Hemoglobin A1C  Hemoglobin-A1c 4.8 - 5.6 % 6.3     Assessment/Plan: 1. Pain due to onychomycosis of toenails of both feet     The mycotic toenails were sharply debrided x10 with sterile nail nippers and a power debriding burr to decrease bulk/thickness and length.    Return in about 3 months (around 09/27/2023) for RFC.   Clerance Lav, DPM, FACFAS [facility]     [address] [place], [place] [address]                Office [phone]  Fax [phone]

## 2023-06-29 ENCOUNTER — Other Ambulatory Visit (HOSPITAL_COMMUNITY): Payer: Self-pay

## 2023-07-07 ENCOUNTER — Other Ambulatory Visit: Payer: Self-pay | Admitting: Nurse Practitioner

## 2023-07-07 ENCOUNTER — Other Ambulatory Visit: Payer: Self-pay

## 2023-07-07 DIAGNOSIS — C189 Malignant neoplasm of colon, unspecified: Secondary | ICD-10-CM

## 2023-07-07 MED ORDER — CAPECITABINE 500 MG PO TABS
500.0000 mg | ORAL_TABLET | Freq: Two times a day (BID) | ORAL | 0 refills | Status: DC
Start: 2023-07-07 — End: 2023-07-29
  Filled 2023-07-07: qty 28, 21d supply, fill #0

## 2023-07-07 NOTE — Progress Notes (Signed)
Specialty Pharmacy Refill Coordination Note  Chad Fisher is a 89 y.o. male contacted today regarding refills of specialty medication(s) Capecitabine .  Patient requested [name] at [facility] at [place]  on 07/12/23   Medication will be filled on 07/12/23 pending Rx refill.

## 2023-07-16 ENCOUNTER — Other Ambulatory Visit (HOSPITAL_COMMUNITY): Payer: Self-pay

## 2023-07-16 ENCOUNTER — Inpatient Hospital Stay: Payer: Medicare Other | Attending: Oncology

## 2023-07-16 ENCOUNTER — Encounter: Payer: Self-pay | Admitting: Nurse Practitioner

## 2023-07-16 ENCOUNTER — Inpatient Hospital Stay: Payer: Medicare Other | Admitting: Nurse Practitioner

## 2023-07-16 VITALS — BP 118/64 | HR 58 | Temp 97.7°F | Resp 16 | Ht 68.0 in | Wt 144.0 lb

## 2023-07-16 DIAGNOSIS — C189 Malignant neoplasm of colon, unspecified: Secondary | ICD-10-CM

## 2023-07-16 DIAGNOSIS — C180 Malignant neoplasm of cecum: Secondary | ICD-10-CM | POA: Diagnosis not present

## 2023-07-16 LAB — CMP (CANCER CENTER ONLY)
ALT: 13 U/L (ref 0–44)
AST: 17 U/L (ref 15–41)
Albumin: 4.1 g/dL (ref 3.5–5.0)
Alkaline Phosphatase: 73 U/L (ref 38–126)
Anion gap: 4 — ABNORMAL LOW (ref 5–15)
BUN: 16 mg/dL (ref 8–23)
CO2: 29 mmol/L (ref 22–32)
Calcium: 9 mg/dL (ref 8.9–10.3)
Chloride: 105 mmol/L (ref 98–111)
Creatinine: 0.98 mg/dL (ref 0.61–1.24)
GFR, Estimated: >60 mL/min (ref >60)
Glucose, Bld: 102 mg/dL — ABNORMAL HIGH (ref 70–99)
Potassium: 4.3 mmol/L (ref 3.5–5.1)
Sodium: 138 mmol/L (ref 135–145)
Total Bilirubin: 0.8 mg/dL (ref 0.3–1.2)
Total Protein: 6.5 g/dL (ref 6.5–8.1)

## 2023-07-16 LAB — CBC WITH DIFFERENTIAL (CANCER CENTER ONLY)
Abs Immature Granulocytes: 0.03 10*3/uL (ref 0.00–0.07)
Basophils Absolute: 0.1 10*3/uL (ref 0.0–0.1)
Basophils Relative: 1 %
Eosinophils Absolute: 0.6 10*3/uL — ABNORMAL HIGH (ref 0.0–0.5)
Eosinophils Relative: 10 %
HCT: 31.5 % — ABNORMAL LOW (ref 39.0–52.0)
Hemoglobin: 10 g/dL — ABNORMAL LOW (ref 13.0–17.0)
Immature Granulocytes: 1 %
Lymphocytes Relative: 25 %
Lymphs Abs: 1.4 10*3/uL (ref 0.7–4.0)
MCH: 28.7 pg (ref 26.0–34.0)
MCHC: 31.7 g/dL (ref 30.0–36.0)
MCV: 90.3 fL (ref 80.0–100.0)
Monocytes Absolute: 0.5 10*3/uL (ref 0.1–1.0)
Monocytes Relative: 8 %
Neutro Abs: 3.2 10*3/uL (ref 1.7–7.7)
Neutrophils Relative %: 55 %
Platelet Count: 177 10*3/uL (ref 150–400)
RBC: 3.49 MIL/uL — ABNORMAL LOW (ref 4.22–5.81)
RDW: 26 % — ABNORMAL HIGH (ref 11.5–15.5)
WBC Count: 5.7 10*3/uL (ref 4.0–10.5)
nRBC: 0 % (ref 0.0–0.2)

## 2023-07-16 NOTE — Progress Notes (Signed)
  [facility] OFFICE PROGRESS NOTE   Diagnosis: Colon cancer  INTERVAL HISTORY:   Mr. Gidney returns as scheduled.  He completed cycle 3 adjuvant Xeloda beginning 06/29/2023.  He denies nausea/vomiting.  No mouth sores.  Intermittent loose stools.  He takes Imodium if needed.  Stable skin changes over the forearms.  Earlier this week feet became "sore".  His wife applied lotion and the soreness resolved.  Objective:  Vital signs in last 24 hours:  Blood pressure 118/64, pulse (!) 58, temperature 97.7 F (36.5 C), temperature source Oral, resp. rate 16, height 5\' 8"  (1.727 m), weight 144 lb (65.3 kg), SpO2 100%.    HEENT: No thrush or ulcers. Resp: Lungs clear bilaterally. Cardio: Regular rate and rhythm. GI: No hepatosplenomegaly. Vascular: No leg edema. Skin: Erythematous rash both forearms.  Face without erythema.  Palms without erythema.  Faint erythema on the soles.  No skin breakdown.   Lab Results:  Lab Results  Component Value Date   WBC 5.7 07/16/2023   HGB 10.0 (L) 07/16/2023   HCT 31.5 (L) 07/16/2023   MCV 90.3 07/16/2023   PLT 177 07/16/2023   NEUTROABS 3.2 07/16/2023    Imaging:  No results found.  Medications: I have reviewed the patient's current medications.  Assessment/Plan: Adenocarcinoma of the cecum, stage IIIb (pT3pN1b,cM0), status post a right colectomy 04/08/2023 Moderately differentiated adenocarcinoma, 3/24 lymph nodes, negative resection margins, no tumor deposits, no loss of mismatch repair protein expression, MSS Colonoscopy 04/07/2023-right colon mass-invasive moderately differentiated adenocarcinoma transverse colon polyp-tubular adenoma CTs 04/06/2023-cecum mass, ileocolic lymphadenopathy, 4 mm lingula nodule-nonspecific Cycle 1 Xeloda 05/17/2023, placed on hold 05/24/2023 following the morning dose due to skin toxicity and mucositis Cycle 2 Xeloda 06/08/2023, Xeloda dose reduced to 500 mg twice daily Cycle 3 Xeloda 06/29/2023,  Xeloda 500 mg twice daily Cycle 4 Xeloda 07/20/2023, 500 mg twice daily Colon polyps-tubular adenoma on colonoscopy 04/07/2023 History of kidney stones Family history of cancer-daughter with breast cancer, son with renal cell carcinoma, brother with prostate cancer Skin toxicity and mucositis secondary to Xeloda with cycle 1, dose reduced with cycle 2  Disposition: Mr. Getter appears stable.  He has completed 3 cycles of adjuvant Xeloda.  Overall he tolerated cycle 3 well.  He continues to have erythema over the forearms which he feels is stable.  Feet became red and tender with cycle 3, better now.  Plan to proceed with cycle 4 as scheduled 07/20/2023, 500 mg twice daily for 14 days followed by 7-day break.  CBC and chemistry panel reviewed.  Labs adequate to proceed as above.  He will return for lab and follow-up on 08/09/2023.  We are available to see him sooner if needed.  He understands to contact the office with progressive skin toxicity.  Lonna Cobb ANP/GNP-BC   07/16/2023  2:38 PM

## 2023-07-20 ENCOUNTER — Other Ambulatory Visit: Payer: Self-pay

## 2023-07-29 ENCOUNTER — Other Ambulatory Visit: Payer: Self-pay

## 2023-07-29 ENCOUNTER — Other Ambulatory Visit: Payer: Self-pay | Admitting: Oncology

## 2023-07-29 DIAGNOSIS — C189 Malignant neoplasm of colon, unspecified: Secondary | ICD-10-CM

## 2023-07-29 MED ORDER — CAPECITABINE 500 MG PO TABS
500.0000 mg | ORAL_TABLET | Freq: Two times a day (BID) | ORAL | 0 refills | Status: DC
  Filled 2023-07-29: qty 28, 21d supply, fill #0

## 2023-07-29 NOTE — Progress Notes (Signed)
Specialty Pharmacy Refill Coordination Note  Chad Fisher is a 89 y.o. male contacted today regarding refills of specialty medication(s) Capecitabine   Patient requested [name] at [facility] at [place] date: 08/04/23   Medication will be filled on 08/03/23 pending refill request.

## 2023-08-02 ENCOUNTER — Other Ambulatory Visit: Payer: Medicare Other

## 2023-08-02 ENCOUNTER — Ambulatory Visit: Payer: Medicare Other | Admitting: Oncology

## 2023-08-06 ENCOUNTER — Ambulatory Visit: Payer: Medicare Other | Admitting: Oncology

## 2023-08-06 ENCOUNTER — Other Ambulatory Visit: Payer: Medicare Other

## 2023-08-09 ENCOUNTER — Inpatient Hospital Stay: Payer: Medicare Other

## 2023-08-09 ENCOUNTER — Inpatient Hospital Stay: Payer: Medicare Other | Admitting: Oncology

## 2023-08-09 VITALS — BP 103/54 | HR 60 | Temp 98.2°F | Resp 18 | Ht 68.0 in | Wt 146.1 lb

## 2023-08-09 DIAGNOSIS — C189 Malignant neoplasm of colon, unspecified: Secondary | ICD-10-CM

## 2023-08-09 DIAGNOSIS — C180 Malignant neoplasm of cecum: Secondary | ICD-10-CM | POA: Diagnosis not present

## 2023-08-09 LAB — CMP (CANCER CENTER ONLY)
ALT: 12 U/L (ref 0–44)
AST: 17 U/L (ref 15–41)
Albumin: 3.9 g/dL (ref 3.5–5.0)
Alkaline Phosphatase: 63 U/L (ref 38–126)
Anion gap: 5 (ref 5–15)
BUN: 16 mg/dL (ref 8–23)
CO2: 31 mmol/L (ref 22–32)
Calcium: 9.2 mg/dL (ref 8.9–10.3)
Chloride: 107 mmol/L (ref 98–111)
Creatinine: 1.05 mg/dL (ref 0.61–1.24)
GFR, Estimated: >60 mL/min (ref >60)
Glucose, Bld: 97 mg/dL (ref 70–99)
Potassium: 4.4 mmol/L (ref 3.5–5.1)
Sodium: 143 mmol/L (ref 135–145)
Total Bilirubin: 1.1 mg/dL (ref 0.3–1.2)
Total Protein: 6.8 g/dL (ref 6.5–8.1)

## 2023-08-09 LAB — CBC WITH DIFFERENTIAL (CANCER CENTER ONLY)
Abs Immature Granulocytes: 0.02 10*3/uL (ref 0.00–0.07)
Basophils Absolute: 0 10*3/uL (ref 0.0–0.1)
Basophils Relative: 1 %
Eosinophils Absolute: 0.5 10*3/uL (ref 0.0–0.5)
Eosinophils Relative: 9 %
HCT: 36.1 % — ABNORMAL LOW (ref 39.0–52.0)
Hemoglobin: 11.8 g/dL — ABNORMAL LOW (ref 13.0–17.0)
Immature Granulocytes: 0 %
Lymphocytes Relative: 21 %
Lymphs Abs: 1.3 10*3/uL (ref 0.7–4.0)
MCH: 31.6 pg (ref 26.0–34.0)
MCHC: 32.7 g/dL (ref 30.0–36.0)
MCV: 96.5 fL (ref 80.0–100.0)
Monocytes Absolute: 0.6 10*3/uL (ref 0.1–1.0)
Monocytes Relative: 10 %
Neutro Abs: 3.6 10*3/uL (ref 1.7–7.7)
Neutrophils Relative %: 59 %
Platelet Count: 156 10*3/uL (ref 150–400)
RBC: 3.74 MIL/uL — ABNORMAL LOW (ref 4.22–5.81)
RDW: 28.6 % — ABNORMAL HIGH (ref 11.5–15.5)
WBC Count: 6 10*3/uL (ref 4.0–10.5)
nRBC: 0 % (ref 0.0–0.2)

## 2023-08-09 NOTE — Progress Notes (Signed)
  [facility] OFFICE PROGRESS NOTE   Diagnosis: Colon cancer  INTERVAL HISTORY:   Mr. Buchberger complete another cycle of Xeloda beginning 07/20/2023.  No mouth sores, nausea, or diarrhea.  Stable rash over the forearms.  He reports mild discomfort at the soles.  He feels well.  Objective:  Vital signs in last 24 hours:  Blood pressure (!) 103/54, pulse 60, temperature 98.2 F (36.8 C), temperature source Oral, resp. rate 18, height 5\' 8"  (1.727 m), weight 146 lb 1.6 oz (66.3 kg), SpO2 100%.    HEENT: No thrush or ulcers Resp: Lungs clear bilaterally Cardio: Regular rate and rhythm GI: No hepatosplenomegaly Vascular: No leg edema  Skin: Palms erythema, erythematous maculopapular rash over the arms, mild erythema at the soles with callus formation, no skin breakdown  Portacath/PICC-without erythema  Lab Results:  Lab Results  Component Value Date   WBC 6.0 08/09/2023   HGB 11.8 (L) 08/09/2023   HCT 36.1 (L) 08/09/2023   MCV 96.5 08/09/2023   PLT 156 08/09/2023   NEUTROABS 3.6 08/09/2023    CMP  Lab Results  Component Value Date   NA 138 07/16/2023   K 4.3 07/16/2023   CL 105 07/16/2023   CO2 29 07/16/2023   GLUCOSE 102 (H) 07/16/2023   BUN 16 07/16/2023   CREATININE 0.98 07/16/2023   CALCIUM 9.0 07/16/2023   PROT 6.5 07/16/2023   ALBUMIN 4.1 07/16/2023   AST 17 07/16/2023   ALT 13 07/16/2023   ALKPHOS 73 07/16/2023   BILITOT 0.8 07/16/2023   GFRNONAA >60 07/16/2023   GFRAA (L) 12/10/2010    57        The eGFR has been calculated using the MDRD equation. This calculation has not been validated in all clinical situations. eGFR's persistently <60 mL/min signify possible Chronic Kidney Disease.    Lab Results  Component Value Date   CEA1 2.1 04/06/2023     Medications: I have reviewed the patient's current medications.   Assessment/Plan: Adenocarcinoma of the cecum, stage IIIb (pT3pN1b,cM0), status post a right colectomy  04/08/2023 Moderately differentiated adenocarcinoma, 3/24 lymph nodes, negative resection margins, no tumor deposits, no loss of mismatch repair protein expression, MSS Colonoscopy 04/07/2023-right colon mass-invasive moderately differentiated adenocarcinoma transverse colon polyp-tubular adenoma CTs 04/06/2023-cecum mass, ileocolic lymphadenopathy, 4 mm lingula nodule-nonspecific Cycle 1 Xeloda 05/17/2023, placed on hold 05/24/2023 following the morning dose due to skin toxicity and mucositis Cycle 2 Xeloda 06/08/2023, Xeloda dose reduced to 500 mg twice daily Cycle 3 Xeloda 06/29/2023, Xeloda 500 mg twice daily Cycle 4 Xeloda 07/20/2023, 500 mg twice daily Cycle 5 Xeloda 08/10/2023, 500 mg twice daily Colon polyps-tubular adenoma on colonoscopy 04/07/2023 History of kidney stones Family history of cancer-daughter with breast cancer, son with renal cell carcinoma, brother with prostate cancer Skin toxicity and mucositis secondary to Xeloda with cycle 1, dose reduced with cycle 2    Disposition: Mr. Teply appears stable.  He is tolerating Xeloda well with current dose.  He has a persistent, but tolerable rash at the arms.  He will continue using a moisturizer.  He will try Voltaren gel if the soles become painful.  He will begin another cycle of Xeloda tomorrow.  He will return for an office and lab visit in 3 weeks.  Thornton Papas, MD  08/09/2023  10:16 AM

## 2023-08-10 ENCOUNTER — Other Ambulatory Visit: Payer: Self-pay

## 2023-08-18 ENCOUNTER — Other Ambulatory Visit: Payer: Self-pay

## 2023-08-18 NOTE — Progress Notes (Signed)
Specialty Pharmacy Ongoing Clinical Assessment Note  Chad Fisher is a 89 y.o. male who is being followed by the specialty pharmacy service for RxSp Oncology   Patient's specialty medication(s) reviewed today: Capecitabine   Missed doses in the last 4 weeks: 0   Patient/Caregiver did not have any additional questions or concerns.   Therapeutic benefit summary: Unable to assess   Adverse events/side effects summary: No adverse events/side effects   Patient's therapy is appropriate to: Continue    Goals Addressed             This Visit's Progress    Slow Disease Progression       Patient is on track. Patient will maintain adherence         Follow up:  1 months  Kalif Kattner E [facility] Specialty Pharmacist

## 2023-08-19 ENCOUNTER — Other Ambulatory Visit (HOSPITAL_COMMUNITY): Payer: Self-pay

## 2023-08-19 ENCOUNTER — Other Ambulatory Visit: Payer: Self-pay | Admitting: Oncology

## 2023-08-19 ENCOUNTER — Other Ambulatory Visit: Payer: Self-pay

## 2023-08-19 DIAGNOSIS — C189 Malignant neoplasm of colon, unspecified: Secondary | ICD-10-CM

## 2023-08-19 MED ORDER — CAPECITABINE 500 MG PO TABS
500.0000 mg | ORAL_TABLET | Freq: Two times a day (BID) | ORAL | 0 refills | Status: DC
  Filled 2023-08-19: qty 28, 21d supply, fill #0

## 2023-08-19 NOTE — Progress Notes (Signed)
Specialty Pharmacy Refill Coordination Note  Chad Fisher is a 89 y.o. male contacted today regarding refills of specialty medication(s) Capecitabine   Patient requested [name] at [facility] at [place] date: 08/25/23   Medication will be filled on 08/24/23.

## 2023-08-20 ENCOUNTER — Other Ambulatory Visit: Payer: Self-pay

## 2023-08-23 ENCOUNTER — Other Ambulatory Visit: Payer: Self-pay | Admitting: Family Medicine

## 2023-08-23 DIAGNOSIS — E7800 Pure hypercholesterolemia, unspecified: Secondary | ICD-10-CM

## 2023-08-24 ENCOUNTER — Other Ambulatory Visit: Payer: Self-pay

## 2023-08-27 ENCOUNTER — Other Ambulatory Visit: Payer: Medicare Other

## 2023-08-30 ENCOUNTER — Inpatient Hospital Stay: Payer: Medicare Other

## 2023-08-30 ENCOUNTER — Ambulatory Visit: Payer: Medicare Other | Admitting: Podiatry

## 2023-08-30 ENCOUNTER — Inpatient Hospital Stay: Payer: Medicare Other | Attending: Oncology | Admitting: Nurse Practitioner

## 2023-08-30 ENCOUNTER — Encounter: Payer: Self-pay | Admitting: Nurse Practitioner

## 2023-08-30 ENCOUNTER — Encounter: Payer: Self-pay | Admitting: Podiatry

## 2023-08-30 VITALS — BP 127/64 | HR 60 | Temp 98.3°F | Resp 18 | Wt 148.9 lb

## 2023-08-30 VITALS — Ht 68.0 in | Wt 148.9 lb

## 2023-08-30 DIAGNOSIS — C180 Malignant neoplasm of cecum: Secondary | ICD-10-CM | POA: Diagnosis not present

## 2023-08-30 DIAGNOSIS — Z9049 Acquired absence of other specified parts of digestive tract: Secondary | ICD-10-CM | POA: Diagnosis not present

## 2023-08-30 DIAGNOSIS — Z9221 Personal history of antineoplastic chemotherapy: Secondary | ICD-10-CM | POA: Insufficient documentation

## 2023-08-30 DIAGNOSIS — B351 Tinea unguium: Secondary | ICD-10-CM

## 2023-08-30 DIAGNOSIS — C189 Malignant neoplasm of colon, unspecified: Secondary | ICD-10-CM

## 2023-08-30 DIAGNOSIS — M79674 Pain in right toe(s): Secondary | ICD-10-CM | POA: Diagnosis not present

## 2023-08-30 DIAGNOSIS — M79675 Pain in left toe(s): Secondary | ICD-10-CM | POA: Diagnosis not present

## 2023-08-30 LAB — CBC WITH DIFFERENTIAL (CANCER CENTER ONLY)
Abs Immature Granulocytes: 0.02 10*3/uL (ref 0.00–0.07)
Basophils Absolute: 0.1 10*3/uL (ref 0.0–0.1)
Basophils Relative: 1 %
Eosinophils Absolute: 0.6 10*3/uL — ABNORMAL HIGH (ref 0.0–0.5)
Eosinophils Relative: 10 %
HCT: 37.8 % — ABNORMAL LOW (ref 39.0–52.0)
Hemoglobin: 12.8 g/dL — ABNORMAL LOW (ref 13.0–17.0)
Immature Granulocytes: 0 %
Lymphocytes Relative: 24 %
Lymphs Abs: 1.4 10*3/uL (ref 0.7–4.0)
MCH: 34.1 pg — ABNORMAL HIGH (ref 26.0–34.0)
MCHC: 33.9 g/dL (ref 30.0–36.0)
MCV: 100.8 fL — ABNORMAL HIGH (ref 80.0–100.0)
Monocytes Absolute: 0.6 10*3/uL (ref 0.1–1.0)
Monocytes Relative: 11 %
Neutro Abs: 3.1 10*3/uL (ref 1.7–7.7)
Neutrophils Relative %: 54 %
Platelet Count: 141 10*3/uL — ABNORMAL LOW (ref 150–400)
RBC: 3.75 MIL/uL — ABNORMAL LOW (ref 4.22–5.81)
RDW: 26 % — ABNORMAL HIGH (ref 11.5–15.5)
WBC Count: 5.7 10*3/uL (ref 4.0–10.5)
nRBC: 0 % (ref 0.0–0.2)

## 2023-08-30 LAB — CMP (CANCER CENTER ONLY)
ALT: 14 U/L (ref 0–44)
AST: 19 U/L (ref 15–41)
Albumin: 4 g/dL (ref 3.5–5.0)
Alkaline Phosphatase: 67 U/L (ref 38–126)
Anion gap: 4 — ABNORMAL LOW (ref 5–15)
BUN: 15 mg/dL (ref 8–23)
CO2: 31 mmol/L (ref 22–32)
Calcium: 9 mg/dL (ref 8.9–10.3)
Chloride: 105 mmol/L (ref 98–111)
Creatinine: 1.06 mg/dL (ref 0.61–1.24)
GFR, Estimated: >60 mL/min (ref >60)
Glucose, Bld: 99 mg/dL (ref 70–99)
Potassium: 4.8 mmol/L (ref 3.5–5.1)
Sodium: 140 mmol/L (ref 135–145)
Total Bilirubin: 0.8 mg/dL (ref <1.2)
Total Protein: 6.7 g/dL (ref 6.5–8.1)

## 2023-08-30 NOTE — Progress Notes (Unsigned)
       Subjective:  Patient ID: Chad Fisher, male    DOB: 1933/02/01,  MRN: [id number]  Chad Fisher presents to clinic today for:  Chief Complaint  Patient presents with   Nail Problem    RFC   Patient notes nails are thick, discolored, elongated and painful in shoegear when trying to ambulate.    PCP is Shelva Majestic, MD.  Past Medical History:  Diagnosis Date   Cancer [facility])    skin   Hernia 1987   right inguinal     Past Surgical History:  Procedure Laterality Date   BIOPSY  04/06/2023   Procedure: BIOPSY;  Surgeon: Hilarie Fredrickson, MD;  Location: [facility];  Service: Gastroenterology;;   CATARACT EXTRACTION, BILATERAL     COLONOSCOPY N/A 04/06/2023   Procedure: COLONOSCOPY;  Surgeon: Hilarie Fredrickson, MD;  Location: [facility];  Service: Gastroenterology;  Laterality: N/A;   HERNIA REPAIR     LAPAROTOMY N/A 04/08/2023   Procedure: EXPLORATORY LAPAROTOMY WITH RIGHT HEMICOLECTOMY;  Surgeon: Diamantina Monks, MD;  Location: [facility];  Service: General;  Laterality: N/A;   MOHS SURGERY     left ear   POLYPECTOMY  04/06/2023   Procedure: POLYPECTOMY;  Surgeon: Hilarie Fredrickson, MD;  Location: [facility];  Service: Gastroenterology;;   SUBMUCOSAL TATTOO INJECTION  04/06/2023   Procedure: SUBMUCOSAL TATTOO INJECTION;  Surgeon: Hilarie Fredrickson, MD;  Location: [facility];  Service: Gastroenterology;;    No Known Allergies  Review of Systems: Negative except as noted in the HPI.  Objective:  Chad Fisher is a pleasant 89 y.o. male in NAD. AAO x 3.  Vascular Examination: Capillary refill time is 3-5 seconds to toes bilateral. Palpable pedal pulses b/l LE. Digital hair present b/l.  Skin temperature gradient WNL b/l. No varicosities b/l. No cyanosis noted b/l.   Dermatological Examination: Pedal skin with normal turgor, texture and tone b/l. No open wounds. No interdigital macerations b/l. Toenails x10 are 3mm thick, discolored, dystrophic with subungual debris. There is  pain with compression of the nail plates.  They are elongated x10. Left 2nd toenail has subungual bruising/ecchymosis noted.  Nail is not lytic.  No surrounding erythema.     Latest Ref Rng & Units 04/08/2023    3:58 AM  Hemoglobin A1C  Hemoglobin-A1c 4.8 - 5.6 % 6.3    Assessment/Plan: 1. Pain due to onychomycosis of toenails of both feet     The mycotic toenails were sharply debrided x10 with sterile nail nippers and a power debriding burr to decrease bulk/thickness and length.    Return in about 3 months (around 11/30/2023) for [facility] w/ Dr. Eloy End.   Clerance Lav, DPM, FACFAS [facility]     [address] [place], [place] [address]                Office [phone]  Fax [phone]

## 2023-08-30 NOTE — Progress Notes (Signed)
  [facility] OFFICE PROGRESS NOTE   Diagnosis: Colon cancer  INTERVAL HISTORY:   Chad Fisher returns as scheduled.  He began cycle 5 Xeloda 08/10/2023.  He denies nausea/vomiting.  No mouth sores.  No diarrhea.  No hand or foot pain or redness.  Skin changes over the forearms have resolved.  Objective:  Vital signs in last 24 hours:  Blood pressure 127/64, pulse 60, temperature 98.3 F (36.8 C), temperature source Temporal, resp. rate 18, weight 148 lb 14.4 oz (67.5 kg), SpO2 98%.    HEENT: No thrush or ulcers. Resp: Lungs clear bilaterally. Cardio: Regular rate and rhythm. GI: No hepatosplenomegaly. Vascular: No leg edema. Skin: Palms and soles without erythema.  Rash over the forearms has resolved.   Lab Results:  Lab Results  Component Value Date   WBC 5.7 08/30/2023   HGB 12.8 (L) 08/30/2023   HCT 37.8 (L) 08/30/2023   MCV 100.8 (H) 08/30/2023   PLT 141 (L) 08/30/2023   NEUTROABS 3.1 08/30/2023    Imaging:  No results found.  Medications: I have reviewed the patient's current medications.  Assessment/Plan: Adenocarcinoma of the cecum, stage IIIb (pT3pN1b,cM0), status post a right colectomy 04/08/2023 Moderately differentiated adenocarcinoma, 3/24 lymph nodes, negative resection margins, no tumor deposits, no loss of mismatch repair protein expression, MSS Colonoscopy 04/07/2023-right colon mass-invasive moderately differentiated adenocarcinoma transverse colon polyp-tubular adenoma CTs 04/06/2023-cecum mass, ileocolic lymphadenopathy, 4 mm lingula nodule-nonspecific Cycle 1 Xeloda 05/17/2023, placed on hold 05/24/2023 following the morning dose due to skin toxicity and mucositis Cycle 2 Xeloda 06/08/2023, Xeloda dose reduced to 500 mg twice daily Cycle 3 Xeloda 06/29/2023, Xeloda 500 mg twice daily Cycle 4 Xeloda 07/20/2023, 500 mg twice daily Cycle 5 Xeloda 08/10/2023, 500 mg twice daily Cycle 6 Xeloda 08/31/2023, 1000 mg every morning, 500 mg every  afternoon Colon polyps-tubular adenoma on colonoscopy 04/07/2023 History of kidney stones Family history of cancer-daughter with breast cancer, son with renal cell carcinoma, brother with prostate cancer Skin toxicity and mucositis secondary to Xeloda with cycle 1, dose reduced with cycle 2    Disposition: Chad Fisher appears stable.  He has completed 5 cycles of adjuvant Xeloda.  He is tolerating the current dose well.  Skin rash over the forearms has resolved.  Plan to proceed with cycle 6 as scheduled beginning 08/31/2023.  We discussed escalating the dose to 1000 mg every morning and 500 mg every afternoon.  He agrees with this plan.  He understands to contact the office if he develops mouth sores, diarrhea, recurrent rash over the forearms, hand/foot pain or redness.  CBC and chemistry panel reviewed.  Labs adequate to proceed as above.  He will return for lab and follow-up in 3 weeks.  We are available to see him sooner if needed.    Lonna Cobb ANP/GNP-BC   08/30/2023  11:23 AM

## 2023-09-10 ENCOUNTER — Other Ambulatory Visit: Payer: Self-pay

## 2023-09-10 ENCOUNTER — Other Ambulatory Visit (HOSPITAL_COMMUNITY): Payer: Self-pay

## 2023-09-14 ENCOUNTER — Other Ambulatory Visit: Payer: Self-pay | Admitting: Oncology

## 2023-09-14 ENCOUNTER — Other Ambulatory Visit: Payer: Self-pay

## 2023-09-14 DIAGNOSIS — C189 Malignant neoplasm of colon, unspecified: Secondary | ICD-10-CM

## 2023-09-14 MED ORDER — CAPECITABINE 500 MG PO TABS
ORAL_TABLET | ORAL | 0 refills | Status: DC
  Filled 2023-09-14: qty 42, 21d supply, fill #0

## 2023-09-14 NOTE — Progress Notes (Signed)
Specialty Pharmacy Refill Coordination Note  Chad Fisher is a 89 y.o. male contacted today regarding refills of specialty medication(s) Capecitabine   Patient requested [name] at [facility] at [place] date: 09/20/23   Medication will be filled on 09/17/23, pending refill request.   Patient states he is now taking 2 am and 1 pm. New prescription should be a quantity of 42 tablets.

## 2023-09-20 ENCOUNTER — Inpatient Hospital Stay: Payer: Medicare Other | Attending: Oncology | Admitting: Oncology

## 2023-09-20 ENCOUNTER — Inpatient Hospital Stay: Payer: Medicare Other

## 2023-09-20 ENCOUNTER — Other Ambulatory Visit (HOSPITAL_COMMUNITY): Payer: Self-pay

## 2023-09-20 VITALS — BP 114/63 | HR 63 | Temp 97.9°F | Resp 18 | Ht 68.0 in | Wt 148.2 lb

## 2023-09-20 DIAGNOSIS — C180 Malignant neoplasm of cecum: Secondary | ICD-10-CM | POA: Diagnosis not present

## 2023-09-20 DIAGNOSIS — C189 Malignant neoplasm of colon, unspecified: Secondary | ICD-10-CM

## 2023-09-20 LAB — CMP (CANCER CENTER ONLY)
ALT: 11 U/L (ref 0–44)
AST: 16 U/L (ref 15–41)
Albumin: 3.9 g/dL (ref 3.5–5.0)
Alkaline Phosphatase: 72 U/L (ref 38–126)
Anion gap: 5 (ref 5–15)
BUN: 19 mg/dL (ref 8–23)
CO2: 30 mmol/L (ref 22–32)
Calcium: 9 mg/dL (ref 8.9–10.3)
Chloride: 107 mmol/L (ref 98–111)
Creatinine: 1.18 mg/dL (ref 0.61–1.24)
GFR, Estimated: 59 mL/min — ABNORMAL LOW (ref >60)
Glucose, Bld: 83 mg/dL (ref 70–99)
Potassium: 4.7 mmol/L (ref 3.5–5.1)
Sodium: 142 mmol/L (ref 135–145)
Total Bilirubin: 0.9 mg/dL (ref <1.2)
Total Protein: 6.6 g/dL (ref 6.5–8.1)

## 2023-09-20 LAB — CBC WITH DIFFERENTIAL (CANCER CENTER ONLY)
Abs Immature Granulocytes: 0.02 10*3/uL (ref 0.00–0.07)
Basophils Absolute: 0.1 10*3/uL (ref 0.0–0.1)
Basophils Relative: 1 %
Eosinophils Absolute: 0.5 10*3/uL (ref 0.0–0.5)
Eosinophils Relative: 9 %
HCT: 39 % (ref 39.0–52.0)
Hemoglobin: 13.3 g/dL (ref 13.0–17.0)
Immature Granulocytes: 0 %
Lymphocytes Relative: 20 %
Lymphs Abs: 1.2 10*3/uL (ref 0.7–4.0)
MCH: 35.7 pg — ABNORMAL HIGH (ref 26.0–34.0)
MCHC: 34.1 g/dL (ref 30.0–36.0)
MCV: 104.6 fL — ABNORMAL HIGH (ref 80.0–100.0)
Monocytes Absolute: 0.7 10*3/uL (ref 0.1–1.0)
Monocytes Relative: 12 %
Neutro Abs: 3.4 10*3/uL (ref 1.7–7.7)
Neutrophils Relative %: 58 %
Platelet Count: 128 10*3/uL — ABNORMAL LOW (ref 150–400)
RBC: 3.73 MIL/uL — ABNORMAL LOW (ref 4.22–5.81)
RDW: 21.1 % — ABNORMAL HIGH (ref 11.5–15.5)
WBC Count: 6 10*3/uL (ref 4.0–10.5)
nRBC: 0 % (ref 0.0–0.2)

## 2023-09-20 NOTE — Progress Notes (Signed)
[facility] OFFICE PROGRESS NOTE   Diagnosis: Colon cancer  INTERVAL HISTORY:   Chad Fisher completed on the cycle of capecitabine beginning 08/31/2023.  No mouth sores, hand/foot pain, or diarrhea.  No increase in the forearm rash with the dose escalation of capecitabine.  He has noted an intermittent right lower abdomen/inguinal "hernia ".  This is not painful.  Objective:  Vital signs in last 24 hours:  Blood pressure 114/63, pulse 63, temperature 97.9 F (36.6 C), temperature source Temporal, resp. rate 18, height 5\' 8"  (1.727 m), weight 148 lb 3.2 oz (67.2 kg), SpO2 98%.    HEENT: No thrush or ulcers Resp: Lungs clear bilaterally Cardio: Regular rate and rhythm GI: Nontender, no mass, no hepatosplenomegaly, no apparent hernia Vascular: No leg edema  Skin: Palms without erythema, hyperpigmentation and thickening at the soles without skin breakdown, mild erythematous maculopapular rash over the trunk, minimal rash over the forearms  Lab Results:  Lab Results  Component Value Date   WBC 6.0 09/20/2023   HGB 13.3 09/20/2023   HCT 39.0 09/20/2023   MCV 104.6 (H) 09/20/2023   PLT 128 (L) 09/20/2023   NEUTROABS 3.4 09/20/2023    CMP  Lab Results  Component Value Date   NA 142 09/20/2023   K 4.7 09/20/2023   CL 107 09/20/2023   CO2 30 09/20/2023   GLUCOSE 83 09/20/2023   BUN 19 09/20/2023   CREATININE 1.18 09/20/2023   CALCIUM 9.0 09/20/2023   PROT 6.6 09/20/2023   ALBUMIN 3.9 09/20/2023   AST 16 09/20/2023   ALT 11 09/20/2023   ALKPHOS 72 09/20/2023   BILITOT 0.9 09/20/2023   GFRNONAA 59 (L) 09/20/2023   GFRAA (L) 12/10/2010    57        The eGFR has been calculated using the MDRD equation. This calculation has not been validated in all clinical situations. eGFR's persistently <60 mL/min signify possible Chronic Kidney Disease.    Lab Results  Component Value Date   CEA1 2.1 04/06/2023    Lab Results  Component Value Date   INR 1.1  04/05/2023   LABPROT 14.4 04/05/2023    Imaging:  No results found.  Medications: I have reviewed the patient's current medications.   Assessment/Plan: Adenocarcinoma of the cecum, stage IIIb (pT3pN1b,cM0), status post a right colectomy 04/08/2023 Moderately differentiated adenocarcinoma, 3/24 lymph nodes, negative resection margins, no tumor deposits, no loss of mismatch repair protein expression, MSS Colonoscopy 04/07/2023-right colon mass-invasive moderately differentiated adenocarcinoma transverse colon polyp-tubular adenoma CTs 04/06/2023-cecum mass, ileocolic lymphadenopathy, 4 mm lingula nodule-nonspecific Cycle 1 Xeloda 05/17/2023, placed on hold 05/24/2023 following the morning dose due to skin toxicity and mucositis Cycle 2 Xeloda 06/08/2023, Xeloda dose reduced to 500 mg twice daily Cycle 3 Xeloda 06/29/2023, Xeloda 500 mg twice daily Cycle 4 Xeloda 07/20/2023, 500 mg twice daily Cycle 5 Xeloda 08/10/2023, 500 mg twice daily Cycle 6 Xeloda 08/31/2023, 1000 mg every morning, 500 mg every afternoon Cycle 7 Xeloda 09/21/2023, 1000 mg in the morning, 500 mg in the afternoon Colon polyps-tubular adenoma on colonoscopy 04/07/2023 History of kidney stones Family history of cancer-daughter with breast cancer, son with renal cell carcinoma, brother with prostate cancer Skin toxicity and mucositis secondary to Xeloda with cycle 1, dose reduced with cycle 2     Disposition: Chad Fisher appears stable.  He tolerated the last cycle of Xeloda well with a dose escalation.  He will complete another cycle at the same dose beginning 09/21/2023.  He will return  for an office and lab visit in 3 weeks.  Thornton Papas, MD  09/20/2023  10:55 AM

## 2023-10-01 ENCOUNTER — Other Ambulatory Visit: Payer: Self-pay

## 2023-10-01 ENCOUNTER — Other Ambulatory Visit: Payer: Self-pay | Admitting: Oncology

## 2023-10-01 DIAGNOSIS — C189 Malignant neoplasm of colon, unspecified: Secondary | ICD-10-CM

## 2023-10-01 MED ORDER — CAPECITABINE 500 MG PO TABS
ORAL_TABLET | ORAL | 0 refills | Status: DC
  Filled 2023-10-01: qty 42, 21d supply, fill #0

## 2023-10-01 NOTE — Progress Notes (Signed)
Specialty Pharmacy Refill Coordination Note  Chad Fisher is a 89 y.o. male contacted today regarding refills of specialty medication(s) Capecitabine (XELODA)   Patient requested Chad Fisher at [facility] at [place] date: 10/07/23   Medication will be filled on 10/05/23.   Pending refill request

## 2023-10-05 ENCOUNTER — Other Ambulatory Visit: Payer: Self-pay

## 2023-10-11 ENCOUNTER — Inpatient Hospital Stay: Payer: Medicare Other

## 2023-10-11 ENCOUNTER — Inpatient Hospital Stay: Payer: Medicare Other | Admitting: Nurse Practitioner

## 2023-10-11 ENCOUNTER — Other Ambulatory Visit (HOSPITAL_COMMUNITY): Payer: Self-pay

## 2023-10-11 ENCOUNTER — Encounter: Payer: Self-pay | Admitting: Nurse Practitioner

## 2023-10-11 VITALS — BP 100/78 | HR 66 | Temp 98.1°F | Resp 18 | Ht 68.0 in | Wt 149.5 lb

## 2023-10-11 DIAGNOSIS — C189 Malignant neoplasm of colon, unspecified: Secondary | ICD-10-CM | POA: Diagnosis not present

## 2023-10-11 DIAGNOSIS — C180 Malignant neoplasm of cecum: Secondary | ICD-10-CM | POA: Diagnosis not present

## 2023-10-11 LAB — CBC WITH DIFFERENTIAL (CANCER CENTER ONLY)
Abs Immature Granulocytes: 0.03 10*3/uL (ref 0.00–0.07)
Basophils Absolute: 0 10*3/uL (ref 0.0–0.1)
Basophils Relative: 1 %
Eosinophils Absolute: 0.6 10*3/uL — ABNORMAL HIGH (ref 0.0–0.5)
Eosinophils Relative: 7 %
HCT: 39.1 % (ref 39.0–52.0)
Hemoglobin: 13.9 g/dL (ref 13.0–17.0)
Immature Granulocytes: 0 %
Lymphocytes Relative: 19 %
Lymphs Abs: 1.5 10*3/uL (ref 0.7–4.0)
MCH: 37.5 pg — ABNORMAL HIGH (ref 26.0–34.0)
MCHC: 35.5 g/dL (ref 30.0–36.0)
MCV: 105.4 fL — ABNORMAL HIGH (ref 80.0–100.0)
Monocytes Absolute: 1 10*3/uL (ref 0.1–1.0)
Monocytes Relative: 12 %
Neutro Abs: 4.8 10*3/uL (ref 1.7–7.7)
Neutrophils Relative %: 61 %
Platelet Count: 138 10*3/uL — ABNORMAL LOW (ref 150–400)
RBC: 3.71 MIL/uL — ABNORMAL LOW (ref 4.22–5.81)
RDW: 17.6 % — ABNORMAL HIGH (ref 11.5–15.5)
WBC Count: 7.9 10*3/uL (ref 4.0–10.5)
nRBC: 0 % (ref 0.0–0.2)

## 2023-10-11 LAB — CMP (CANCER CENTER ONLY)
ALT: 8 U/L (ref 0–44)
AST: 13 U/L — ABNORMAL LOW (ref 15–41)
Albumin: 4.1 g/dL (ref 3.5–5.0)
Alkaline Phosphatase: 86 U/L (ref 38–126)
Anion gap: 5 (ref 5–15)
BUN: 16 mg/dL (ref 8–23)
CO2: 30 mmol/L (ref 22–32)
Calcium: 8.9 mg/dL (ref 8.9–10.3)
Chloride: 105 mmol/L (ref 98–111)
Creatinine: 1.2 mg/dL (ref 0.61–1.24)
GFR, Estimated: 58 mL/min — ABNORMAL LOW (ref >60)
Glucose, Bld: 83 mg/dL (ref 70–99)
Potassium: 4.3 mmol/L (ref 3.5–5.1)
Sodium: 140 mmol/L (ref 135–145)
Total Bilirubin: 1.3 mg/dL — ABNORMAL HIGH (ref 0.0–1.2)
Total Protein: 6.8 g/dL (ref 6.5–8.1)

## 2023-10-11 LAB — FERRITIN: Ferritin: 34 ng/mL (ref 24–336)

## 2023-10-11 NOTE — Progress Notes (Signed)
  [facility] OFFICE PROGRESS NOTE   Diagnosis: Colon cancer  INTERVAL HISTORY:   Chad Fisher returns as scheduled.  He completed cycle 7 Xeloda beginning 09/21/2023.  He noted no increase in the previous skin rash on the forearms.  No nausea or vomiting.  No mouth sores.  He estimates 2 loose stools a day for the past few days.  Toward the end of the 14 days of Xeloda he noted soreness and redness on a portion of the palms.  Symptoms improved over 3 to 4 days.  Objective:  Vital signs in last 24 hours:  Blood pressure 100/78, pulse 66, temperature 98.1 F (36.7 C), temperature source Temporal, resp. rate 18, height 5\' 8"  (1.727 m), weight 149 lb 8 oz (67.8 kg), SpO2 98%.    HEENT: No thrush or ulcers. Resp: Lungs clear bilaterally. Cardio: Regular rate and rhythm. GI: Abdomen soft and nontender.  No hepatosplenomegaly. Vascular: No leg edema. Skin: Palms with skin thickening, no erythema.  Thickening and hyperpigmentation at the soles, no skin breakdown.  Right second toe with erythema just inferior to the nailbed, nontender.  No significant rash at the forearms.   Lab Results:  Lab Results  Component Value Date   WBC 7.9 10/11/2023   HGB 13.9 10/11/2023   HCT 39.1 10/11/2023   MCV 105.4 (H) 10/11/2023   PLT 138 (L) 10/11/2023   NEUTROABS 4.8 10/11/2023    Imaging:  No results found.  Medications: I have reviewed the patient's current medications.  Assessment/Plan: Adenocarcinoma of the cecum, stage IIIb (pT3pN1b,cM0), status post a right colectomy 04/08/2023 Moderately differentiated adenocarcinoma, 3/24 lymph nodes, negative resection margins, no tumor deposits, no loss of mismatch repair protein expression, MSS Colonoscopy 04/07/2023-right colon mass-invasive moderately differentiated adenocarcinoma transverse colon polyp-tubular adenoma CTs 04/06/2023-cecum mass, ileocolic lymphadenopathy, 4 mm lingula nodule-nonspecific Cycle 1 Xeloda 05/17/2023, placed  on hold 05/24/2023 following the morning dose due to skin toxicity and mucositis Cycle 2 Xeloda 06/08/2023, Xeloda dose reduced to 500 mg twice daily Cycle 3 Xeloda 06/29/2023, Xeloda 500 mg twice daily Cycle 4 Xeloda 07/20/2023, 500 mg twice daily Cycle 5 Xeloda 08/10/2023, 500 mg twice daily Cycle 6 Xeloda 08/31/2023, 1000 mg every morning, 500 mg every afternoon Cycle 7 Xeloda 09/21/2023, 1000 mg in the morning, 500 mg in the afternoon Cycle 8 Xeloda 10/12/2023, 1000 mg in the morning, 500 mg in the afternoon Colon polyps-tubular adenoma on colonoscopy 04/07/2023 History of kidney stones Family history of cancer-daughter with breast cancer, son with renal cell carcinoma, brother with prostate cancer Skin toxicity and mucositis secondary to Xeloda with cycle 1, dose reduced with cycle 2  Disposition: Chad Fisher appears stable.  He has completed 7 cycles of adjuvant Xeloda.  Plan to proceed with cycle 8 as scheduled beginning 10/12/2023.  He understands to stop Xeloda and contact the office with progressive changes of hand-foot syndrome, recurrent rash at the forearms.  CBC reviewed.  Counts adequate to begin Xeloda as above.  He will return for follow-up in 4 weeks.  He will contact the office in the interim as outlined above or with any other problems.  Lonna Cobb ANP/GNP-BC   10/11/2023  2:12 PM

## 2023-10-12 ENCOUNTER — Telehealth: Payer: Self-pay

## 2023-10-12 NOTE — Telephone Encounter (Signed)
-----   Message from Lonna Cobb sent at 10/12/2023  9:06 AM EST ----- Please let him know ferritin is normal.  He can stop oral iron.

## 2023-10-12 NOTE — Telephone Encounter (Signed)
Patient gave verbal understanding and had no further questions or concerns  

## 2023-10-18 DIAGNOSIS — H40013 Open angle with borderline findings, low risk, bilateral: Secondary | ICD-10-CM | POA: Diagnosis not present

## 2023-10-18 DIAGNOSIS — H353132 Nonexudative age-related macular degeneration, bilateral, intermediate dry stage: Secondary | ICD-10-CM | POA: Diagnosis not present

## 2023-10-18 DIAGNOSIS — C189 Malignant neoplasm of colon, unspecified: Secondary | ICD-10-CM | POA: Diagnosis not present

## 2023-10-18 DIAGNOSIS — H524 Presbyopia: Secondary | ICD-10-CM | POA: Diagnosis not present

## 2023-10-18 DIAGNOSIS — H5789 Other specified disorders of eye and adnexa: Secondary | ICD-10-CM | POA: Diagnosis not present

## 2023-10-19 ENCOUNTER — Other Ambulatory Visit: Payer: Self-pay

## 2023-10-21 ENCOUNTER — Other Ambulatory Visit (HOSPITAL_COMMUNITY): Payer: Self-pay

## 2023-10-26 ENCOUNTER — Telehealth: Payer: Self-pay | Admitting: *Deleted

## 2023-10-26 NOTE — Telephone Encounter (Signed)
 Chad Fisher called to report he took his last Xeloda  on 10/25/23. Toward the end, his feet and hands have become swollen, red and very sore. No open blisters. Is using his suggested cream twice daily. No mouth sores. Asking if he needs to be seen or if this will resolve on it's own with no longer taking the xeloda ? There is no change in his arms. Instructed patient to continue the lotions and try cool compress on feet/hands few times/day. Should resolve with time. No need to be seen at this time.

## 2023-11-01 ENCOUNTER — Ambulatory Visit: Payer: Medicare Other | Admitting: Podiatry

## 2023-11-01 VITALS — Ht 68.0 in

## 2023-11-01 DIAGNOSIS — B351 Tinea unguium: Secondary | ICD-10-CM

## 2023-11-01 DIAGNOSIS — M79674 Pain in right toe(s): Secondary | ICD-10-CM | POA: Diagnosis not present

## 2023-11-01 DIAGNOSIS — M79675 Pain in left toe(s): Secondary | ICD-10-CM | POA: Diagnosis not present

## 2023-11-01 NOTE — Progress Notes (Signed)
       Subjective:  Patient ID: Chad Fisher, male    DOB: [birth year],  MRN: [id number]   [name] presents to clinic today for:  Chief Complaint  Patient presents with   [facility]    He is here for a nail trim and he feels the nails has a rough couple of the small toes on left toe,    Patient notes nails are thick, discolored, elongated and painful in shoegear when trying to ambulate.  He has noticed some dried flaky skin near the great toe joint.  PCP is Shelva Majestic, MD.  Past Medical History:  Diagnosis Date   Cancer [facility])    skin   Hernia 1987   right inguinal     Past Surgical History:  Procedure Laterality Date   BIOPSY  04/06/2023   Procedure: BIOPSY;  Surgeon: Hilarie Fredrickson, MD;  Location: [facility];  Service: Gastroenterology;;   CATARACT EXTRACTION, BILATERAL     COLONOSCOPY N/A 04/06/2023   Procedure: COLONOSCOPY;  Surgeon: Hilarie Fredrickson, MD;  Location: [facility];  Service: Gastroenterology;  Laterality: N/A;   HERNIA REPAIR     LAPAROTOMY N/A 04/08/2023   Procedure: EXPLORATORY LAPAROTOMY WITH RIGHT HEMICOLECTOMY;  Surgeon: Diamantina Monks, MD;  Location: [facility];  Service: General;  Laterality: N/A;   MOHS SURGERY     left ear   POLYPECTOMY  04/06/2023   Procedure: POLYPECTOMY;  Surgeon: Hilarie Fredrickson, MD;  Location: [facility];  Service: Gastroenterology;;   SUBMUCOSAL TATTOO INJECTION  04/06/2023   Procedure: SUBMUCOSAL TATTOO INJECTION;  Surgeon: Hilarie Fredrickson, MD;  Location: [facility];  Service: Gastroenterology;;    No Known Allergies  Review of Systems: Negative except as noted in the HPI.  Objective:  [name] is a pleasant 89 y.o. male in NAD. AAO x 3.  Vascular Examination: Capillary refill time is 3-5 seconds to toes bilateral. Palpable pedal pulses b/l LE. Digital hair present b/l.  Skin temperature gradient WNL b/l. No varicosities b/l. No cyanosis noted b/l.   Dermatological Examination: Pedal skin with normal turgor,  texture and tone b/l. No open wounds. No interdigital macerations b/l. Toenails x10 are 3mm thick, discolored, dystrophic with subungual debris. There is pain with compression of the nail plates.  They are elongated x10.  There is a loose piece of dry skin noted along the medial aspect of the left first MPJ.  This was removed uneventfully.      Latest Ref Rng & Units 04/08/2023    3:58 AM  Hemoglobin A1C  Hemoglobin-A1c 4.8 - 5.6 % 6.3    Assessment/Plan: 1. Pain due to onychomycosis of toenails of both feet    The mycotic toenails were sharply debrided x10 with sterile nail nippers and a power debriding burr to decrease bulk/thickness and length.    Patient encouraged to moisturize the feet daily.  Return in about 3 months (around 01/30/2024) for RFC.   Clerance Lav, DPM, FACFAS [facility]     [address] [place], [place] [address]                Office [phone]  Fax [phone]

## 2023-11-08 DIAGNOSIS — L718 Other rosacea: Secondary | ICD-10-CM | POA: Diagnosis not present

## 2023-11-08 DIAGNOSIS — Z85828 Personal history of other malignant neoplasm of skin: Secondary | ICD-10-CM | POA: Diagnosis not present

## 2023-11-08 DIAGNOSIS — L711 Rhinophyma: Secondary | ICD-10-CM | POA: Diagnosis not present

## 2023-11-08 DIAGNOSIS — L570 Actinic keratosis: Secondary | ICD-10-CM | POA: Diagnosis not present

## 2023-11-08 DIAGNOSIS — L821 Other seborrheic keratosis: Secondary | ICD-10-CM | POA: Diagnosis not present

## 2023-11-08 DIAGNOSIS — L723 Sebaceous cyst: Secondary | ICD-10-CM | POA: Diagnosis not present

## 2023-11-12 ENCOUNTER — Other Ambulatory Visit: Payer: Self-pay

## 2023-11-12 ENCOUNTER — Inpatient Hospital Stay: Payer: Medicare Other | Admitting: Oncology

## 2023-11-12 ENCOUNTER — Inpatient Hospital Stay: Payer: Medicare Other | Attending: Oncology

## 2023-11-12 VITALS — BP 104/73 | HR 89 | Temp 98.2°F | Resp 18 | Ht 68.0 in | Wt 148.6 lb

## 2023-11-12 DIAGNOSIS — C189 Malignant neoplasm of colon, unspecified: Secondary | ICD-10-CM

## 2023-11-12 DIAGNOSIS — Z9221 Personal history of antineoplastic chemotherapy: Secondary | ICD-10-CM | POA: Insufficient documentation

## 2023-11-12 DIAGNOSIS — Z85038 Personal history of other malignant neoplasm of large intestine: Secondary | ICD-10-CM | POA: Insufficient documentation

## 2023-11-12 LAB — CBC WITH DIFFERENTIAL (CANCER CENTER ONLY)
Abs Immature Granulocytes: 0.02 10*3/uL (ref 0.00–0.07)
Basophils Absolute: 0.1 10*3/uL (ref 0.0–0.1)
Basophils Relative: 2 %
Eosinophils Absolute: 0.4 10*3/uL (ref 0.0–0.5)
Eosinophils Relative: 8 %
HCT: 37.7 % — ABNORMAL LOW (ref 39.0–52.0)
Hemoglobin: 12.9 g/dL — ABNORMAL LOW (ref 13.0–17.0)
Immature Granulocytes: 1 %
Lymphocytes Relative: 26 %
Lymphs Abs: 1.2 10*3/uL (ref 0.7–4.0)
MCH: 37.4 pg — ABNORMAL HIGH (ref 26.0–34.0)
MCHC: 34.2 g/dL (ref 30.0–36.0)
MCV: 109.3 fL — ABNORMAL HIGH (ref 80.0–100.0)
Monocytes Absolute: 0.4 10*3/uL (ref 0.1–1.0)
Monocytes Relative: 9 %
Neutro Abs: 2.4 10*3/uL (ref 1.7–7.7)
Neutrophils Relative %: 54 %
Platelet Count: 154 10*3/uL (ref 150–400)
RBC: 3.45 MIL/uL — ABNORMAL LOW (ref 4.22–5.81)
RDW: 15.3 % (ref 11.5–15.5)
WBC Count: 4.4 10*3/uL (ref 4.0–10.5)
nRBC: 0 % (ref 0.0–0.2)

## 2023-11-12 LAB — CMP (CANCER CENTER ONLY)
ALT: 16 U/L (ref 0–44)
AST: 19 U/L (ref 15–41)
Albumin: 3.7 g/dL (ref 3.5–5.0)
Alkaline Phosphatase: 89 U/L (ref 38–126)
Anion gap: 5 (ref 5–15)
BUN: 15 mg/dL (ref 8–23)
CO2: 30 mmol/L (ref 22–32)
Calcium: 9.2 mg/dL (ref 8.9–10.3)
Chloride: 107 mmol/L (ref 98–111)
Creatinine: 1.09 mg/dL (ref 0.61–1.24)
GFR, Estimated: >60 mL/min (ref >60)
Glucose, Bld: 91 mg/dL (ref 70–99)
Potassium: 4.2 mmol/L (ref 3.5–5.1)
Sodium: 142 mmol/L (ref 135–145)
Total Bilirubin: 1.2 mg/dL (ref 0.0–1.2)
Total Protein: 6.9 g/dL (ref 6.5–8.1)

## 2023-11-12 LAB — CEA (ACCESS): CEA (CHCC): 2.02 ng/mL (ref 0.00–5.00)

## 2023-11-12 NOTE — Progress Notes (Signed)
Xeloda therapy completed. Disenrolled in Transport planner.

## 2023-11-12 NOTE — Progress Notes (Signed)
[facility] OFFICE PROGRESS NOTE   Diagnosis: Colon cancer  INTERVAL HISTORY:   Mr. Barletta completed another cycle of Xeloda beginning 10/12/2023.  No mouth sores or diarrhea.  Good appetite.  No difficulty with bowel function.  No bleeding.  The skin at the soles is peeling.  He has noted improvement in the rash over the arms.  He has an intermittent reducible right inguinal hernia.  Objective:  Vital signs in last 24 hours:  Blood pressure 104/73, pulse 89, temperature 98.2 F (36.8 C), temperature source Temporal, resp. rate 18, height 5\' 8"  (1.727 m), weight 148 lb 9.6 oz (67.4 kg), SpO2 93%.    HEENT: No thrush or ulcers Lymphatics: No cervical, supraclavicular, axillary, or inguinal nodes Resp: Lungs clear bilaterally Cardio: Regular rate and rhythm GI: No hepatosplenomegaly, no mass, small reducible right inguinal hernia? Vascular: No leg edema  Skin: Skin thickening at the palms, dry desquamation and callus formation at the soles   Lab Results:  Lab Results  Component Value Date   WBC 4.4 11/12/2023   HGB 12.9 (L) 11/12/2023   HCT 37.7 (L) 11/12/2023   MCV 109.3 (H) 11/12/2023   PLT 154 11/12/2023   NEUTROABS 2.4 11/12/2023    CMP  Lab Results  Component Value Date   NA 142 11/12/2023   K 4.2 11/12/2023   CL 107 11/12/2023   CO2 30 11/12/2023   GLUCOSE 91 11/12/2023   BUN 15 11/12/2023   CREATININE 1.09 11/12/2023   CALCIUM 9.2 11/12/2023   PROT 6.9 11/12/2023   ALBUMIN 3.7 11/12/2023   AST 19 11/12/2023   ALT 16 11/12/2023   ALKPHOS 89 11/12/2023   BILITOT 1.2 11/12/2023   GFRNONAA >60 11/12/2023   GFRAA (L) 12/10/2010    57        The eGFR has been calculated using the MDRD equation. This calculation has not been validated in all clinical situations. eGFR's persistently <60 mL/min signify possible Chronic Kidney Disease.    Lab Results  Component Value Date   CEA1 2.1 04/06/2023   CEA 2.02 11/12/2023    Lab Results   Component Value Date   INR 1.1 04/05/2023   LABPROT 14.4 04/05/2023    Imaging:  No results found.  Medications: I have reviewed the patient's current medications.   Assessment/Plan: Adenocarcinoma of the cecum, stage IIIb (pT3pN1b,cM0), status post a right colectomy 04/08/2023 Moderately differentiated adenocarcinoma, 3/24 lymph nodes, negative resection margins, no tumor deposits, no loss of mismatch repair protein expression, MSS Colonoscopy 04/07/2023-right colon mass-invasive moderately differentiated adenocarcinoma transverse colon polyp-tubular adenoma CTs 04/06/2023-cecum mass, ileocolic lymphadenopathy, 4 mm lingula nodule-nonspecific Cycle 1 Xeloda 05/17/2023, placed on hold 05/24/2023 following the morning dose due to skin toxicity and mucositis Cycle 2 Xeloda 06/08/2023, Xeloda dose reduced to 500 mg twice daily Cycle 3 Xeloda 06/29/2023, Xeloda 500 mg twice daily Cycle 4 Xeloda 07/20/2023, 500 mg twice daily Cycle 5 Xeloda 08/10/2023, 500 mg twice daily Cycle 6 Xeloda 08/31/2023, 1000 mg every morning, 500 mg every afternoon Cycle 7 Xeloda 09/21/2023, 1000 mg in the morning, 500 mg in the afternoon Cycle 8 Xeloda 10/12/2023, 1000 mg in the morning, 500 mg in the afternoon Colon polyps-tubular adenoma on colonoscopy 04/07/2023 History of kidney stones Family history of cancer-daughter with breast cancer, son with renal cell carcinoma, brother with prostate cancer Skin toxicity and mucositis secondary to Xeloda with cycle 1, dose reduced with cycle 2    Disposition: Mr. Fawver has completed adjuvant chemotherapy.  He  is in clinical remission from colon cancer.  He will return for an office visit and CEA in June.  He will schedule an appointment with Dr. Marina Goodell to discuss the indication for a surveillance colonoscopy.  We discussed clinical versus CT surveillance for colon cancer.  He is comfortable not having surveillance CTs.    Thornton Papas, MD  11/12/2023  11:04 AM

## 2023-11-24 ENCOUNTER — Ambulatory Visit: Payer: Self-pay | Admitting: Family Medicine

## 2023-11-24 ENCOUNTER — Telehealth: Payer: Self-pay | Admitting: Podiatry

## 2023-11-24 NOTE — Telephone Encounter (Signed)
Chief Complaint: orange urine, L knee pain Symptoms: chills, pain in knee Frequency: knee pain about a week, chills and orange urine yesterday started Pertinent Negatives: Patient denies fever, pain with urination, pressure, odor, abd/back pain,   Disposition: [] ED /[] Urgent Care (no appt availability in office) / [x] Appointment(In office/virtual)/ []  [facility]/ [] Home Care/ [] Refused Recommended Disposition /[] [facility]/ []  Follow-up with PCP  Additional Notes: Pt states that he recently finished chemo for colon CA. Pt states that yesterday his urine was orange and he had the chills. Denies chills today and states that he does not have any other GU s/s. Pt states that he also believes he injured his L knee during his move. Pt believes he may have twisted it. Hurts to put weight on his L knee. Pt denies redness, swelling. Pt offered appt this AM at pcp clinic, pt declined stating that he only wanted to see MD Chad Fisher or Chad Fisher. Pt advised that the doctors do not have availability today and that RN strongly encouraged pt be seen today as there are appts today. Pt declined. Sched with Dr Chad Fisher tomorrow.   Copied from CRM [id number]. Topic: Clinical - Red Word Triage >> Nov 24, 2023  8:01 AM Chad Fisher wrote: [name] that prompted transfer to Nurse Triage: Patient states he might have hernia pain at times and he can feel it, twisted knee having trouble walking using a cane, painful when he walks. States urine is orange and has had the chills yesterday evening. Reason for Disposition  [1] MODERATE pain (e.g., interferes with normal activities, limping) AND [2] present > 3 days  Answer Assessment - Initial Assessment Questions 1. LOCATION and RADIATION: "Where is the pain located?"      L knee 2. QUALITY: "What does the pain feel like?"  (e.g., sharp, dull, aching, burning)     Pain when I put weight on it 3. SEVERITY: "How bad is the pain?" "What does it keep you from  doing?"   (Scale 1-10; or mild, moderate, severe)   -  MILD (1-3): doesn't interfere with normal activities    -  MODERATE (4-7): interferes with normal activities (e.g., work or school) or awakens from sleep, limping    -  SEVERE (8-10): excruciating pain, unable to do any normal activities, unable to walk     At times it can be a 7-8 4. ONSET: "When did the pain start?" "Does it come and go, or is it there all the time?"     About a week 5. RECURRENT: "Have you had this pain before?" If Yes, ask: "When, and what happened then?"     denies 6. SETTING: "Has there been any recent work, exercise or other activity that involved that part of the body?"      Believes he twisted it when moving 7. AGGRAVATING FACTORS: "What makes the knee pain worse?" (e.g., walking, climbing stairs, running)     Not that I know of  8. ASSOCIATED SYMPTOMS: "Is there any swelling or redness of the knee?"     denies 9. OTHER SYMPTOMS: "Do you have any other symptoms?" (e.g., chest pain, difficulty breathing, fever, calf pain)     denies  Answer Assessment - Initial Assessment Questions 1. SYMPTOM: "What's the main symptom you're concerned about?" (e.g., frequency, incontinence)     Orange colored urine 2. ONSET: "When did the  orange color  start?"     yesterday 3. PAIN: "Is there any pain?" If Yes, ask: "  How bad is it?" (Scale: 1-10; mild, moderate, severe)     0 4. CAUSE: "What do you think is causing the symptoms?"     UTI? I have no idea 5. OTHER SYMPTOMS: "Do you have any other symptoms?" (e.g., blood in urine, fever, flank pain, pain with urination)     Chills,  Protocols used: Urinary Symptoms-A-AH, Knee Pain-A-AH

## 2023-11-24 NOTE — Telephone Encounter (Signed)
Received a message from [facility] about an address and ph# change ....  Upon checking we had the correct address and ph# for he and his wife ....   Called him back - LMVM to advise same ......    J. Abbott -- 11/24/2023

## 2023-11-24 NOTE — Telephone Encounter (Signed)
I would love to have work him in today but I am only here in the morning and I am just now getting his message-please have him seek care if worsening symptoms overnight and I appreciate Dr. Jimmey Ralph seeing him tomorrow

## 2023-11-25 ENCOUNTER — Encounter: Payer: Self-pay | Admitting: Family Medicine

## 2023-11-25 ENCOUNTER — Ambulatory Visit: Payer: Medicare Other | Admitting: Family Medicine

## 2023-11-25 VITALS — BP 113/66 | HR 74 | Temp 97.2°F | Ht 68.0 in | Wt 144.8 lb

## 2023-11-25 DIAGNOSIS — R8290 Unspecified abnormal findings in urine: Secondary | ICD-10-CM | POA: Diagnosis not present

## 2023-11-25 DIAGNOSIS — K4090 Unilateral inguinal hernia, without obstruction or gangrene, not specified as recurrent: Secondary | ICD-10-CM

## 2023-11-25 DIAGNOSIS — M25562 Pain in left knee: Secondary | ICD-10-CM

## 2023-11-25 DIAGNOSIS — N183 Chronic kidney disease, stage 3 unspecified: Secondary | ICD-10-CM

## 2023-11-25 LAB — COMPREHENSIVE METABOLIC PANEL
ALT: 143 U/L — ABNORMAL HIGH (ref 0–53)
AST: 104 U/L — ABNORMAL HIGH (ref 0–37)
Albumin: 3.9 g/dL (ref 3.5–5.2)
Alkaline Phosphatase: 203 U/L — ABNORMAL HIGH (ref 39–117)
BUN: 20 mg/dL (ref 6–23)
CO2: 29 meq/L (ref 19–32)
Calcium: 9.2 mg/dL (ref 8.4–10.5)
Chloride: 103 meq/L (ref 96–112)
Creatinine, Ser: 1.17 mg/dL (ref 0.40–1.50)
GFR: 55.68 mL/min — ABNORMAL LOW (ref >60.00)
Glucose, Bld: 109 mg/dL — ABNORMAL HIGH (ref 70–99)
Potassium: 4.8 meq/L (ref 3.5–5.1)
Sodium: 140 meq/L (ref 135–145)
Total Bilirubin: 3.5 mg/dL — ABNORMAL HIGH (ref 0.2–1.2)
Total Protein: 7.2 g/dL (ref 6.0–8.3)

## 2023-11-25 LAB — CBC
HCT: 40 % (ref 39.0–52.0)
Hemoglobin: 13.5 g/dL (ref 13.0–17.0)
MCHC: 33.7 g/dL (ref 30.0–36.0)
MCV: 109.8 fL — ABNORMAL HIGH (ref 78.0–100.0)
Platelets: 114 10*3/uL — ABNORMAL LOW (ref 150.0–400.0)
RBC: 3.64 Mil/uL — ABNORMAL LOW (ref 4.22–5.81)
RDW: 17.3 % — ABNORMAL HIGH (ref 11.5–15.5)
WBC: 4.4 10*3/uL (ref 4.0–10.5)

## 2023-11-25 LAB — URINALYSIS, ROUTINE W REFLEX MICROSCOPIC
Bilirubin Urine: NEGATIVE
Hgb urine dipstick: NEGATIVE
Leukocytes,Ua: NEGATIVE
Nitrite: NEGATIVE
RBC / HPF: NONE SEEN (ref 0–?)
Specific Gravity, Urine: 1.015 (ref 1.000–1.030)
Total Protein, Urine: NEGATIVE
Urine Glucose: >=1000 — AB
Urobilinogen, UA: 0.2 (ref 0.0–1.0)
pH: 6 (ref 5.0–8.0)

## 2023-11-25 LAB — POCT URINALYSIS DIPSTICK
Bilirubin, UA: POSITIVE
Blood, UA: POSITIVE
Glucose, UA: NEGATIVE
Ketones, UA: NEGATIVE
Nitrite, UA: NEGATIVE
Protein, UA: POSITIVE — AB
Spec Grav, UA: >=1.03 — AB (ref 1.010–1.025)
Urobilinogen, UA: 2 U/dL — AB
pH, UA: 5 (ref 5.0–8.0)

## 2023-11-25 LAB — CK: Total CK: 138 U/L (ref 7–232)

## 2023-11-25 MED ORDER — PREDNISONE 20 MG PO TABS
40.0000 mg | ORAL_TABLET | Freq: Every day | ORAL | 0 refills | Status: AC
Start: 2023-11-25 — End: 2023-11-30

## 2023-11-25 MED ORDER — NITROFURANTOIN MONOHYD MACRO 100 MG PO CAPS: 100.0000 mg | ORAL_CAPSULE | Freq: Two times a day (BID) | ORAL | 0 refills | Status: DC

## 2023-11-25 NOTE — Progress Notes (Signed)
Chad Fisher is a 89 y.o. male who presents today for an office visit.  Assessment/Plan:  New/Acute Problems: Discolored urine No signs of systemic illness however his point-of-care UA does show positive RBCs high specific gravity, leukocyte esterase, abnormal color.  It is possible this may represent UTI however he is not having any other signs or symptoms of UTI.  Due to his age and comorbidities we will empirically treat with Macrobid while we await urine culture however we will also check CBC, c-Met, CK, and send out urinalysis with microscopy to rule out other potential etiologies for his discolored urine.  Right knee pain No red flags.  Does have some medial joint pain and slight effusion on exam.  May have had a slight MCL strain however likely due to a flareup of his underlying osteoarthritis.  We are avoid NSAIDs while we assess his kidney function as above.  Will start prednisone for 5 days.  We also discussed ice and compression.  He will let us know if not improving and we can check imaging or referral at that time if needed.  Right inguinal hernia No red flags.  Easily reducible on exam.  We did discuss referral to see surgeon however he declined.  We discussed reasons to return to care.  Chronic Problems Addressed Today: CKD 3 Check c-Met.  We discussed importance of good hydration.  Avoiding NSAIDs as above.     Subjective:  HPI:  See Assessment / plan for status of chronic conditions.  Patient is here with change in urine color.  This started a few days. He noticed that his urine has turned orange color. He has not had any burning or frequency. No urgency. They did recently move into a new retirement facility a week ago. HE did some lifting. He did have some chills a couple of days ago but this is since resolved.  No back pain.  No nausea or vomiting.  No constipation or diarrhea.  No hematuria.  No melena.     He has noticed a mass in his right groin  He has been  having more pain in his left knee the last week or so. He has been doing a lot of moving and thinks that he may have strained it. They tried Voltaren without much improvement.  Worse on the middle part of his left knee.  Worse with certain activities.  No swelling.  No bruising.  No bleeding.  He has also noticed a mass in his right groin.  This has been present for the last few months.  He has discussed this with his oncologist a couple of times.  He is worried about recurrent hernia.  Symptoms come and go.  Worse with certain motions.  Overall not painful and very manageable.        Objective:  Physical Exam: BP 113/66   Pulse 74   Temp (!) 97.2 F (36.2 C) (Temporal)   Ht 5\' 8"  (1.727 m)   Wt 144 lb 12.8 oz (65.7 kg)   SpO2 94%   BMI 22.02 kg/m   Gen: No acute distress, resting comfortably CV: Regular rate and rhythm with no murmurs appreciated Pulm: Normal work of breathing, clear to auscultation bilaterally with no crackles, wheezes, or rhonchi MUSCULOSKELETAL: - Back: No CVA tenderness - Left Knee: Slight effusion noted.  Tender to palpation along medial joint line.  Stable to varus and valgus stress.  Drawer sign is negative.  Neurovascular intact distally GU: Easily reducible right  inguinal hernia noted. Neuro: Grossly normal, moves all extremities Psych: Normal affect and thought content      Chad Rust M. Jimmey Ralph, MD 11/25/2023 10:23 AM

## 2023-11-25 NOTE — Patient Instructions (Signed)
It was very nice to see you today!  I think you may have a urinary tract infection.  Please start the Macrobid.  We will check blood work and a urine sample today to confirm this and also check your kidneys.  Please start the prednisone for your knee.  Please continue with ice and compression.  Let us know if not improving.  Please let us know if the hernia worsens and we can refer you to see the surgeon.  Return if symptoms worsen or fail to improve.   Take care, Dr Jimmey Ralph  PLEASE NOTE:  If you had any lab tests, please let us know if you have not heard back within a few days. You may see your results on mychart before we have a chance to review them but we will give you a call once they are reviewed by [place].   If we ordered any referrals today, please let us know if you have not heard from their office within the next week.   If you had any urgent prescriptions sent in today, please check with the pharmacy within an hour of our visit to make sure the prescription was transmitted appropriately.   Please try these tips to maintain a healthy lifestyle:  Eat at least 3 REAL meals and 1-2 snacks per day.  Aim for no more than 5 hours between eating.  If you eat breakfast, please do so within one hour of getting up.   Each meal should contain half fruits/vegetables, one quarter protein, and one quarter carbs (no bigger than a computer mouse)  Cut down on sweet beverages. This includes juice, soda, and sweet tea.   Drink at least 1 glass of water with each meal and aim for at least 8 glasses per day  Exercise at least 150 minutes every week.

## 2023-11-26 LAB — URINE CULTURE
MICRO NUMBER:: 16080885
Result:: NO GROWTH
SPECIMEN QUALITY:: ADEQUATE

## 2023-11-26 NOTE — Progress Notes (Signed)
His liver numbers are elevated.  I believe this is probably what is causing his discolored urine.  His urine testing is all negative but we are still waiting on his urine culture to come back.  We need to figure out what is causing his liver elevation.  Please have him come back early next week to recheck c-Met, fractionated bilirubin, GGT, PT-INR, hepatitis panel, and lipase.

## 2023-11-29 ENCOUNTER — Other Ambulatory Visit: Payer: Self-pay | Admitting: *Deleted

## 2023-11-29 DIAGNOSIS — R748 Abnormal levels of other serum enzymes: Secondary | ICD-10-CM

## 2023-11-30 ENCOUNTER — Other Ambulatory Visit (INDEPENDENT_AMBULATORY_CARE_PROVIDER_SITE_OTHER): Payer: Medicare Other

## 2023-11-30 DIAGNOSIS — R748 Abnormal levels of other serum enzymes: Secondary | ICD-10-CM

## 2023-11-30 LAB — COMPREHENSIVE METABOLIC PANEL
ALT: 70 U/L — ABNORMAL HIGH (ref 0–53)
AST: 32 U/L (ref 0–37)
Albumin: 3.9 g/dL (ref 3.5–5.2)
Alkaline Phosphatase: 184 U/L — ABNORMAL HIGH (ref 39–117)
BUN: 26 mg/dL — ABNORMAL HIGH (ref 6–23)
CO2: 30 meq/L (ref 19–32)
Calcium: 9 mg/dL (ref 8.4–10.5)
Chloride: 105 meq/L (ref 96–112)
Creatinine, Ser: 1.1 mg/dL (ref 0.40–1.50)
GFR: 59.95 mL/min — ABNORMAL LOW (ref >60.00)
Glucose, Bld: 124 mg/dL — ABNORMAL HIGH (ref 70–99)
Potassium: 4 meq/L (ref 3.5–5.1)
Sodium: 143 meq/L (ref 135–145)
Total Bilirubin: 1.4 mg/dL — ABNORMAL HIGH (ref 0.2–1.2)
Total Protein: 7 g/dL (ref 6.0–8.3)

## 2023-11-30 LAB — LIPASE: Lipase: 37 U/L (ref 11.0–59.0)

## 2023-11-30 LAB — PROTIME-INR
INR: 1 {ratio} (ref 0.8–1.0)
Prothrombin Time: 10.6 s (ref 9.6–13.1)

## 2023-11-30 LAB — GAMMA GT: GGT: 141 U/L — ABNORMAL HIGH (ref 7–51)

## 2023-11-30 NOTE — Progress Notes (Signed)
Urine culture is negative. No signs of urinary tract infection.  Chad Fisher. Jimmey Ralph, MD 11/30/2023 8:27 AM

## 2023-11-30 NOTE — Progress Notes (Signed)
His liver numbers are improving however his labs do indicate that he had some inflammation in his gallbladder.  I believe this is where most of his symptoms are coming from.  Recommend we check right upper quadrant ultrasound to look for gallstones or other causes.  Please place order.  He should let us know if his symptoms are not improving.

## 2023-12-01 ENCOUNTER — Other Ambulatory Visit: Payer: Self-pay | Admitting: *Deleted

## 2023-12-01 DIAGNOSIS — R748 Abnormal levels of other serum enzymes: Secondary | ICD-10-CM

## 2023-12-03 LAB — HEPATITIS PANEL(REFL)
HEPATITIS C ANTIBODY REFILL$(REFL): NONREACTIVE
Hep B Core Total Ab: NONREACTIVE
Hep B S Ab: NONREACTIVE
Hepatitis A AB,Total: NONREACTIVE
Hepatitis B Surface Ag: NONREACTIVE

## 2023-12-03 LAB — BILIRUBIN, FRACTIONATED(TOT/DIR/INDIR)
Bilirubin, Direct: 0.5 mg/dL — ABNORMAL HIGH (ref 0.0–0.2)
Indirect Bilirubin: 0.8 mg/dL (ref 0.2–1.2)
Total Bilirubin: 1.3 mg/dL — ABNORMAL HIGH (ref 0.2–1.2)

## 2023-12-03 LAB — REFLEX TIQ

## 2023-12-03 NOTE — Progress Notes (Signed)
His hepatitis panel is negative.  Bilirubin levels are improving.  We will contact him once we get results back from his right upper quadrant ultrasound.  He should let us know if his symptoms are not improving.

## 2023-12-05 ENCOUNTER — Emergency Department (HOSPITAL_COMMUNITY): Payer: Medicare Other

## 2023-12-05 ENCOUNTER — Encounter (HOSPITAL_COMMUNITY): Payer: Self-pay | Admitting: Emergency Medicine

## 2023-12-05 ENCOUNTER — Inpatient Hospital Stay (HOSPITAL_COMMUNITY)
Admission: EM | Admit: 2023-12-05 | Discharge: 2023-12-12 | DRG: 418 | Disposition: A | Discharge status: Home / Self Care | Payer: Medicare Other | Attending: Student | Admitting: Student

## 2023-12-05 ENCOUNTER — Other Ambulatory Visit: Payer: Self-pay

## 2023-12-05 DIAGNOSIS — K8050 Calculus of bile duct without cholangitis or cholecystitis without obstruction: Secondary | ICD-10-CM | POA: Diagnosis present

## 2023-12-05 DIAGNOSIS — N183 Chronic kidney disease, stage 3 unspecified: Secondary | ICD-10-CM | POA: Diagnosis present

## 2023-12-05 DIAGNOSIS — E785 Hyperlipidemia, unspecified: Secondary | ICD-10-CM | POA: Diagnosis present

## 2023-12-05 DIAGNOSIS — N281 Cyst of kidney, acquired: Secondary | ICD-10-CM | POA: Diagnosis not present

## 2023-12-05 DIAGNOSIS — K7460 Unspecified cirrhosis of liver: Secondary | ICD-10-CM | POA: Diagnosis not present

## 2023-12-05 DIAGNOSIS — D696 Thrombocytopenia, unspecified: Secondary | ICD-10-CM | POA: Diagnosis not present

## 2023-12-05 DIAGNOSIS — K760 Fatty (change of) liver, not elsewhere classified: Secondary | ICD-10-CM | POA: Diagnosis not present

## 2023-12-05 DIAGNOSIS — Z9221 Personal history of antineoplastic chemotherapy: Secondary | ICD-10-CM

## 2023-12-05 DIAGNOSIS — D7589 Other specified diseases of blood and blood-forming organs: Secondary | ICD-10-CM | POA: Diagnosis not present

## 2023-12-05 DIAGNOSIS — C180 Malignant neoplasm of cecum: Secondary | ICD-10-CM

## 2023-12-05 DIAGNOSIS — K4090 Unilateral inguinal hernia, without obstruction or gangrene, not specified as recurrent: Secondary | ICD-10-CM | POA: Diagnosis not present

## 2023-12-05 DIAGNOSIS — E039 Hypothyroidism, unspecified: Secondary | ICD-10-CM | POA: Diagnosis present

## 2023-12-05 DIAGNOSIS — R945 Abnormal results of liver function studies: Secondary | ICD-10-CM | POA: Diagnosis not present

## 2023-12-05 DIAGNOSIS — N1832 Chronic kidney disease, stage 3b: Secondary | ICD-10-CM | POA: Diagnosis present

## 2023-12-05 DIAGNOSIS — K8021 Calculus of gallbladder without cholecystitis with obstruction: Secondary | ICD-10-CM | POA: Diagnosis not present

## 2023-12-05 DIAGNOSIS — Z79890 Hormone replacement therapy (postmenopausal): Secondary | ICD-10-CM

## 2023-12-05 DIAGNOSIS — K8042 Calculus of bile duct with acute cholecystitis without obstruction: Secondary | ICD-10-CM | POA: Diagnosis not present

## 2023-12-05 DIAGNOSIS — R932 Abnormal findings on diagnostic imaging of liver and biliary tract: Secondary | ICD-10-CM | POA: Diagnosis not present

## 2023-12-05 DIAGNOSIS — E782 Mixed hyperlipidemia: Secondary | ICD-10-CM | POA: Diagnosis not present

## 2023-12-05 DIAGNOSIS — Z1152 Encounter for screening for COVID-19: Secondary | ICD-10-CM

## 2023-12-05 DIAGNOSIS — Z8051 Family history of malignant neoplasm of kidney: Secondary | ICD-10-CM | POA: Diagnosis not present

## 2023-12-05 DIAGNOSIS — Z803 Family history of malignant neoplasm of breast: Secondary | ICD-10-CM

## 2023-12-05 DIAGNOSIS — Z85038 Personal history of other malignant neoplasm of large intestine: Secondary | ICD-10-CM | POA: Diagnosis not present

## 2023-12-05 DIAGNOSIS — K8000 Calculus of gallbladder with acute cholecystitis without obstruction: Secondary | ICD-10-CM | POA: Diagnosis not present

## 2023-12-05 DIAGNOSIS — Z79899 Other long term (current) drug therapy: Secondary | ICD-10-CM | POA: Diagnosis not present

## 2023-12-05 DIAGNOSIS — R1084 Generalized abdominal pain: Secondary | ICD-10-CM | POA: Diagnosis not present

## 2023-12-05 DIAGNOSIS — Z85828 Personal history of other malignant neoplasm of skin: Secondary | ICD-10-CM | POA: Diagnosis not present

## 2023-12-05 DIAGNOSIS — K8001 Calculus of gallbladder with acute cholecystitis with obstruction: Secondary | ICD-10-CM | POA: Diagnosis not present

## 2023-12-05 DIAGNOSIS — N179 Acute kidney failure, unspecified: Secondary | ICD-10-CM | POA: Diagnosis not present

## 2023-12-05 DIAGNOSIS — R7989 Other specified abnormal findings of blood chemistry: Principal | ICD-10-CM | POA: Diagnosis present

## 2023-12-05 DIAGNOSIS — K8020 Calculus of gallbladder without cholecystitis without obstruction: Secondary | ICD-10-CM | POA: Diagnosis not present

## 2023-12-05 DIAGNOSIS — R109 Unspecified abdominal pain: Secondary | ICD-10-CM | POA: Diagnosis not present

## 2023-12-05 DIAGNOSIS — R1011 Right upper quadrant pain: Secondary | ICD-10-CM | POA: Diagnosis not present

## 2023-12-05 DIAGNOSIS — K8010 Calculus of gallbladder with chronic cholecystitis without obstruction: Secondary | ICD-10-CM | POA: Diagnosis not present

## 2023-12-05 DIAGNOSIS — K838 Other specified diseases of biliary tract: Secondary | ICD-10-CM | POA: Diagnosis not present

## 2023-12-05 DIAGNOSIS — C189 Malignant neoplasm of colon, unspecified: Secondary | ICD-10-CM | POA: Diagnosis not present

## 2023-12-05 LAB — CBC WITH DIFFERENTIAL/PLATELET
Abs Immature Granulocytes: 0.08 10*3/uL — ABNORMAL HIGH (ref 0.00–0.07)
Basophils Absolute: 0 10*3/uL (ref 0.0–0.1)
Basophils Relative: 0 %
Eosinophils Absolute: 0 10*3/uL (ref 0.0–0.5)
Eosinophils Relative: 0 %
HCT: 41.8 % (ref 39.0–52.0)
Hemoglobin: 13.9 g/dL (ref 13.0–17.0)
Immature Granulocytes: 1 %
Lymphocytes Relative: 3 %
Lymphs Abs: 0.4 10*3/uL — ABNORMAL LOW (ref 0.7–4.0)
MCH: 35.8 pg — ABNORMAL HIGH (ref 26.0–34.0)
MCHC: 33.3 g/dL (ref 30.0–36.0)
MCV: 107.7 fL — ABNORMAL HIGH (ref 80.0–100.0)
Monocytes Absolute: 0.2 10*3/uL (ref 0.1–1.0)
Monocytes Relative: 2 %
Neutro Abs: 11.5 10*3/uL — ABNORMAL HIGH (ref 1.7–7.7)
Neutrophils Relative %: 94 %
Platelets: 213 10*3/uL (ref 150–400)
RBC: 3.88 MIL/uL — ABNORMAL LOW (ref 4.22–5.81)
RDW: 14.2 % (ref 11.5–15.5)
WBC: 12.2 10*3/uL — ABNORMAL HIGH (ref 4.0–10.5)
nRBC: 0 % (ref 0.0–0.2)

## 2023-12-05 LAB — RESP PANEL BY RT-PCR (RSV, FLU A&B, COVID)  RVPGX2
Influenza A by PCR: NEGATIVE
Influenza B by PCR: NEGATIVE
Resp Syncytial Virus by PCR: NEGATIVE
SARS Coronavirus 2 by RT PCR: NEGATIVE

## 2023-12-05 LAB — COMPREHENSIVE METABOLIC PANEL
ALT: 213 U/L — ABNORMAL HIGH (ref 0–44)
AST: 353 U/L — ABNORMAL HIGH (ref 15–41)
Albumin: 3.4 g/dL — ABNORMAL LOW (ref 3.5–5.0)
Alkaline Phosphatase: 221 U/L — ABNORMAL HIGH (ref 38–126)
Anion gap: 10 (ref 5–15)
BUN: 19 mg/dL (ref 8–23)
CO2: 25 mmol/L (ref 22–32)
Calcium: 8.5 mg/dL — ABNORMAL LOW (ref 8.9–10.3)
Chloride: 102 mmol/L (ref 98–111)
Creatinine, Ser: 0.97 mg/dL (ref 0.61–1.24)
GFR, Estimated: >60 mL/min (ref >60)
Glucose, Bld: 144 mg/dL — ABNORMAL HIGH (ref 70–99)
Potassium: 4.5 mmol/L (ref 3.5–5.1)
Sodium: 137 mmol/L (ref 135–145)
Total Bilirubin: 2.8 mg/dL — ABNORMAL HIGH (ref 0.0–1.2)
Total Protein: 6.5 g/dL (ref 6.5–8.1)

## 2023-12-05 LAB — URINALYSIS, W/ REFLEX TO CULTURE (INFECTION SUSPECTED)
Bacteria, UA: NONE SEEN
Bilirubin Urine: NEGATIVE
Glucose, UA: NEGATIVE mg/dL
Ketones, ur: NEGATIVE mg/dL
Leukocytes,Ua: NEGATIVE
Nitrite: NEGATIVE
Protein, ur: NEGATIVE mg/dL
Specific Gravity, Urine: 1.017 (ref 1.005–1.030)
pH: 6 (ref 5.0–8.0)

## 2023-12-05 LAB — BILIRUBIN, DIRECT: Bilirubin, Direct: 1.5 mg/dL — ABNORMAL HIGH (ref 0.0–0.2)

## 2023-12-05 LAB — LIPASE, BLOOD: Lipase: 56 U/L — ABNORMAL HIGH (ref 11–51)

## 2023-12-05 MED ORDER — ONDANSETRON HCL 4 MG PO TABS: 4.0000 mg | ORAL_TABLET | Freq: Four times a day (QID) | ORAL | Status: DC | PRN

## 2023-12-05 MED ORDER — SODIUM CHLORIDE 0.9% FLUSH
3.0000 mL | Freq: Two times a day (BID) | INTRAVENOUS | Status: DC
  Administered 2023-12-05 – 2023-12-06 (×2): 10 mL via INTRAVENOUS

## 2023-12-05 MED ORDER — ACETAMINOPHEN 325 MG PO TABS: 650.0000 mg | ORAL_TABLET | Freq: Four times a day (QID) | ORAL | Status: DC | PRN

## 2023-12-05 MED ORDER — GADOBUTROL 1 MMOL/ML IV SOLN
6.0000 mL | Freq: Once | INTRAVENOUS | Status: AC | PRN
  Administered 2023-12-05: 6 mL via INTRAVENOUS

## 2023-12-05 MED ORDER — SODIUM CHLORIDE 0.9 % IV BOLUS
500.0000 mL | Freq: Once | INTRAVENOUS | Status: AC
  Administered 2023-12-05: 500 mL via INTRAVENOUS

## 2023-12-05 MED ORDER — SODIUM CHLORIDE 0.45 % IV SOLN: INTRAVENOUS | Status: AC

## 2023-12-05 MED ORDER — IOHEXOL 300 MG/ML  SOLN
100.0000 mL | Freq: Once | INTRAMUSCULAR | Status: AC | PRN
  Administered 2023-12-05: 100 mL via INTRAVENOUS

## 2023-12-05 MED ORDER — ONDANSETRON HCL 4 MG/2ML IJ SOLN
4.0000 mg | Freq: Once | INTRAMUSCULAR | Status: AC
  Administered 2023-12-05: 4 mg via INTRAVENOUS
  Filled 2023-12-05: qty 2

## 2023-12-05 MED ORDER — ONDANSETRON HCL 4 MG/2ML IJ SOLN
4.0000 mg | Freq: Four times a day (QID) | INTRAMUSCULAR | Status: DC | PRN
Start: 2023-12-05 — End: 2023-12-12
  Administered 2023-12-10: 4 mg via INTRAVENOUS

## 2023-12-05 MED ORDER — SODIUM CHLORIDE 0.9% FLUSH: 3.0000 mL | INTRAVENOUS | Status: DC | PRN

## 2023-12-05 MED ORDER — PIPERACILLIN-TAZOBACTAM 3.375 G IVPB
3.3750 g | Freq: Three times a day (TID) | INTRAVENOUS | Status: DC
  Administered 2023-12-05 – 2023-12-07 (×6): 3.375 g via INTRAVENOUS
  Filled 2023-12-05 (×6): qty 50

## 2023-12-05 MED ORDER — PIPERACILLIN-TAZOBACTAM 3.375 G IVPB 30 MIN: 3.3750 g | Freq: Three times a day (TID) | INTRAVENOUS | Status: DC

## 2023-12-05 MED ORDER — ACETAMINOPHEN 650 MG RE SUPP: 650.0000 mg | Freq: Four times a day (QID) | RECTAL | Status: DC | PRN

## 2023-12-05 NOTE — ED Provider Notes (Signed)
 Emergency Department Provider Note   I have reviewed the triage vital signs and the nursing notes.   HISTORY  Chief Complaint Abdominal Pain and Chills   HPI DAILY CRATE is a 89 y.o. male presents to the emergency department with mid abdominal pain returning this evening with some associated nausea.  Patient states 2 weeks ago he had similar pain and went to his PCP and has been scheduled for an outpatient ultrasound on 2/25.  Last night he developed return of pain with some associated chills.  He denies any discomfort in his chest or shortness of breath.  No documented fever.  Pain is cramping but also sharp at times. No clear provoking factors.    Past Medical History:  Diagnosis Date   Cancer [facility])    skin   Hernia 1987   right inguinal     Review of Systems  Constitutional: No fever/chills Cardiovascular: Denies chest pain. Respiratory: Denies shortness of breath. Gastrointestinal: Positive abdominal pain. Positive nausea and vomiting.  No diarrhea.  No constipation. Skin: Negative for rash. Neurological: Negative for headaches.  ____________________________________________   PHYSICAL EXAM:  VITAL SIGNS: ED Triage Vitals  Encounter Vitals Group     BP 12/05/23 0323 (!) 156/76     Pulse Rate 12/05/23 0323 92     Resp 12/05/23 0323 17     Temp 12/05/23 0323 98.4 F (36.9 C)     Temp Source 12/05/23 0323 Oral     SpO2 12/05/23 0323 97 %     Weight 12/05/23 0324 144 lb (65.3 kg)     Height 12/05/23 0324 5\' 8"  (1.727 m)   Constitutional: Alert and oriented. Well appearing and in no acute distress. Eyes: Conjunctivae are normal.  Head: Atraumatic. Nose: No congestion/rhinnorhea. Mouth/Throat: Mucous membranes are moist.   Neck: No stridor.   Cardiovascular: Normal rate, regular rhythm. Good peripheral circulation. Grossly normal heart sounds.   Respiratory: Normal respiratory effort.  No retractions. Lungs CTAB. Gastrointestinal: Soft and nontender. No  RUQ tenderness or Murphy's sign. No distention.  Musculoskeletal: No gross deformities of extremities. Neurologic:  Normal speech and language.  Skin:  Skin is warm, dry and intact. No rash noted.  ____________________________________________   LABS (all labs ordered are listed, but only abnormal results are displayed)  Labs Reviewed  RESP PANEL BY RT-PCR (RSV, FLU A&B, COVID)  [id number]  COMPREHENSIVE METABOLIC PANEL  LIPASE, BLOOD  CBC WITH DIFFERENTIAL/PLATELET  URINALYSIS, W/ REFLEX TO CULTURE (INFECTION SUSPECTED)   ____________________________________________  RADIOLOGY  No results found.  ____________________________________________   PROCEDURES  Procedure(s) performed:   Procedures   ____________________________________________   INITIAL IMPRESSION / ASSESSMENT AND PLAN / ED COURSE  Pertinent labs & imaging results that were available during my care of the patient were reviewed by me and considered in my medical decision making (see chart for details).   This patient is Presenting for Evaluation of abdominal pain, which does require a range of treatment options, and is a complaint that involves a high risk of morbidity and mortality.  The Differential Diagnoses includes but is not exclusive to acute cholecystitis, intrathoracic causes for epigastric abdominal pain, gastritis, duodenitis, pancreatitis, small bowel or large bowel obstruction, abdominal aortic aneurysm, hernia, gastritis, etc.   Critical Interventions-    Medications  sodium chloride 0.9 % bolus 500 mL (has no administration in time range)  ondansetron (ZOFRAN) injection 4 mg (has no administration in time range)    Reassessment after intervention: Pain/nausea improved.  I did obtain Additional Historical Information from wife at bedside.  I decided to review pertinent External Data, and in summary seen by PCP on 2/13 and started on Macrobid with urine discoloration and equivocal UA. LFTs  abnormal and sent for [place] in that setting. Bilirubin down-trending and hepatitis panel negative.    Clinical Laboratory Tests Ordered, included ***  Radiologic Tests Ordered, included CT abdomen/pelvis. I independently interpreted the images and agree with radiology interpretation.   Cardiac Monitor Tracing which shows NSR.    Social Determinants of Health Risk patient is a non-smoker.   Consult complete with  Medical Decision Making: Summary:  Patient presents emergency department with abdominal pain along with nausea.  Has been followed by his PCP with transient pain and abnormal bilirubin that trended back toward normal.  Outpatient ultrasound scheduled but with return of pain this evening plan for repeat labs and CT abdomen pelvis.  Overall, abdominal exam is reassuring and patient is very well-appearing. No SIRS vitals or other concerning symptoms to suspect early sepsis.   Reevaluation with update and discussion with   ***Considered admission***  Patient's presentation is most consistent with acute presentation with potential threat to life or bodily function.   Disposition:   ____________________________________________  FINAL CLINICAL IMPRESSION(S) / ED DIAGNOSES  Final diagnoses:  None     NEW OUTPATIENT MEDICATIONS STARTED DURING THIS VISIT:  New Prescriptions   No medications on file    Note:  This document was prepared using Dragon voice recognition software and may include unintentional dictation errors.  Alona Bene, MD, [facility]

## 2023-12-05 NOTE — Consult Note (Signed)
 Consultation  Referring Provider:      Primary Care Physician:  Shelva Majestic, MD Primary Gastroenterologist:   Dr Marina Goodell      Reason for Consultation:    Abdominal pain, abnormal LFTs         HPI:   Chad Fisher is a 89 y.o. 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 Most recent episode last evening with associated nausea but no vomiting.  He also had chills but no fever.  Denied having any triggering factors.  Found to have abnormal LFTs.  Had neg ultrasound, CT Abdo/pelvis MRCP showed 2 mm CBD stone with Nl caliber CBD, small stone in the gallbladder neck.  Also food in the stomach.  Patient admitted for further eval.  Currently feeling better.  Wife had noticed jaundice/dark urine.  Elevated LFTs Mildly elevated lipase at 56 (was 37, 5 days ago)     Latest Ref Rng & Units 12/05/2023    3:55 AM 11/30/2023    8:07 AM 11/25/2023   10:27 AM  Hepatic Function  Total Protein 6.5 - 8.1 g/dL 6.5  7.0  7.2   Albumin 3.5 - 5.0 g/dL 3.4  3.9  3.9   AST 15 - 41 U/L 353  32  104   ALT 0 - 44 U/L 213  70  143   Alk Phosphatase 38 - 126 U/L 221  184  203   Total Bilirubin 0.0 - 1.2 mg/dL 2.8  1.4    1.3  3.5   Bilirubin, Direct 0.0 - 0.2 mg/dL 1.5  0.5     MRCP today 1. Tiny 2 mm stone in the distal common bile duct. No substantial intrahepatic biliary duct dilatation. Common bile duct measures 6 mm in the head of the pancreas. 2. Tiny stone in the neck of the gallbladder. Gallbladder wall thickness upper normal at 3 mm with increased signal in the wall the gallbladder suggesting edema. Imaging features raise concern for acute cholecystitis. 3. Hepatic steatosis. 4. Bilateral simple renal cysts.   Past Medical History:  Diagnosis Date   Cancer [facility])     skin   Hernia 1987   right inguinal     Past Surgical History:  Procedure Laterality Date   BIOPSY  04/06/2023   Procedure: BIOPSY;  Surgeon: Hilarie Fredrickson, MD;  Location: [facility];  Service: Gastroenterology;;   CATARACT EXTRACTION, BILATERAL     COLONOSCOPY N/A 04/06/2023   Procedure: COLONOSCOPY;  Surgeon: Hilarie Fredrickson, MD;  Location: [facility];  Service: Gastroenterology;  Laterality: N/A;   HERNIA REPAIR     LAPAROTOMY N/A 04/08/2023   Procedure: EXPLORATORY LAPAROTOMY WITH RIGHT HEMICOLECTOMY;  Surgeon: Diamantina Monks, MD;  Location: [facility];  Service: General;  Laterality: N/A;   MOHS SURGERY     left ear   POLYPECTOMY  04/06/2023   Procedure: POLYPECTOMY;  Surgeon: Hilarie Fredrickson, MD;  Location: [facility];  Service: Gastroenterology;;   SUBMUCOSAL TATTOO INJECTION  04/06/2023   Procedure: SUBMUCOSAL TATTOO INJECTION;  Surgeon: Hilarie Fredrickson, MD;  Location: [facility];  Service: Gastroenterology;;    Family History  Problem Relation Age of Onset   Anemia Father    Benign prostatic hyperplasia Brother    Cancer Daughter    Breast cancer Daughter  Kidney cancer Son        doing ok     Social History   Tobacco Use   Smoking status: Never   Smokeless tobacco: Never  Vaping Use   Vaping status: Never Used  Substance Use Topics   Alcohol use: No   Drug use: No    Prior to Admission medications   Medication Sig Start Date End Date Taking? Authorizing Provider  cyanocobalamin (VITAMIN B12) 1000 MCG/ML injection INJECT 1mL INTRAMUSCULARLY EVERY 30 DAYS Patient taking differently: Inject 1,000 mcg into the muscle every 30 (thirty) days. 12/01/22  Yes Shelva Majestic, MD  levothyroxine (SYNTHROID) 75 MCG tablet TAKE 1 TABLET BY MOUTH DAILY 08/23/23  Yes Shelva Majestic, MD  Potassium Citrate 15 MEQ (1620 MG) TBCR Take 1 tablet by mouth daily. 09/22/18  Yes [provider]  simvastatin (ZOCOR) 20 MG tablet TAKE 1 TABLET BY MOUTH DAILY 08/23/23  Yes  Shelva Majestic, MD  nitrofurantoin, macrocrystal-monohydrate, (MACROBID) 100 MG capsule Take 1 capsule (100 mg total) by mouth 2 (two) times daily. Patient not taking: Reported on 12/05/2023 11/25/23   Ardith Dark, MD    Current Facility-Administered Medications  Medication Dose Route Frequency Provider Last Rate Last Admin   0.45 % sodium chloride infusion   Intravenous Continuous Bobette Mo, MD 125 mL/hr at 12/05/23 1128 New Bag at 12/05/23 1128   acetaminophen (TYLENOL) tablet 650 mg  650 mg Oral Q6H PRN Bobette Mo, MD       Or   acetaminophen (TYLENOL) suppository 650 mg  650 mg Rectal Q6H PRN Bobette Mo, MD       ondansetron [facility]) tablet 4 mg  4 mg Oral Q6H PRN Bobette Mo, MD       Or   ondansetron [facility]) injection 4 mg  4 mg Intravenous Q6H PRN Bobette Mo, MD       piperacillin-tazobactam (ZOSYN) IVPB 3.375 g  3.375 g Intravenous Q8H Bobette Mo, MD 12.5 mL/hr at 12/05/23 1130 3.375 g at 12/05/23 1130    Allergies as of 12/05/2023   (No Known Allergies)     Review of Systems:    As per HPI, otherwise negative    Physical Exam:  Vital signs in last 24 hours: Temp:  [98.4 F (36.9 C)-100 F (37.8 C)] 100 F (37.8 C) (02/23 0950) Pulse Rate:  [87-92] 87 (02/23 0950) Resp:  [17-18] 18 (02/23 0950) BP: (101-156)/(55-76) 116/55 (02/23 0950) SpO2:  [93 %-99 %] 99 % (02/23 0950) Weight:  [65.3 kg] 65.3 kg (02/23 0324)   Gen: awake, alert, NAD HEENT: icteric, no pallor CV: RRR, no mrg Pulm: CTA b/l Abd: soft, NT/ND, no Murphy's sign, +BS throughout Ext: no c/c/e Neuro: nonfocal   LAB RESULTS: Recent Labs    12/05/23 0355  WBC 12.2*  HGB 13.9  HCT 41.8  PLT 213   BMET Recent Labs    12/05/23 0355  NA 137  K 4.5  CL 102  CO2 25  GLUCOSE 144*  BUN 19  CREATININE 0.97  CALCIUM 8.5*   LFT Recent Labs    12/05/23 0355  PROT 6.5  ALBUMIN 3.4*  AST 353*  ALT 213*  ALKPHOS 221*  BILITOT  2.8*  BILIDIR 1.5*   PT/INR No results for input(s): "LABPROT", "INR" in the last 72 hours.  STUDIES: MR ABDOMEN MRCP W WO CONTAST Result Date: 12/05/2023 CLINICAL DATA:  Generalized abdominal pain.  Nausea and vomiting. EXAM: MRI ABDOMEN  WITHOUT AND WITH CONTRAST (INCLUDING MRCP) TECHNIQUE: Multiplanar multisequence MR imaging of the abdomen was performed both before and after the administration of intravenous contrast. Heavily T2-weighted images of the biliary and pancreatic ducts were obtained, and three-dimensional MRCP images were rendered by post processing. CONTRAST:  6mL GADAVIST GADOBUTROL 1 MMOL/ML IV SOLN COMPARISON:  Abdomen and pelvis CT earlier same day. Ultrasound exam earlier same day. Chest abdomen pelvis CT 04/06/2023. FINDINGS: Lower chest: Subsegmental atelectasis noted left lung base. Hepatobiliary: Diffuse loss of signal intensity in the liver parenchyma on out of phase T1 imaging is compatible with fatty deposition. No suspicious focal abnormality within the liver parenchyma. Gallbladder wall thickness upper normal at 3 mm with increased signal in the wall the gallbladder suggesting edema. MRCP imaging is markedly motion degraded. Within this limitation, tiny stone identified in the neck of the gallbladder (coronal MRCP image 23 of series 10 and axial T2 image 22 of series 4). Tiny 2 mm stone identified in the distal common bile duct (coronal MRCP image 23/10 and axial T2 haste image 24/26 and axial T2 image 25/4). No substantial intrahepatic biliary duct dilatation. Common bile duct measures 6 mm in the head of the pancreas. Pancreas: No focal mass lesion. No dilatation of the main duct. No intraparenchymal cyst. No peripancreatic edema. Spleen:  No splenomegaly. No suspicious focal mass lesion. Adrenals/Urinary Tract: No adrenal nodule or mass. Bilateral simple renal cysts noted measuring up to 6.8 cm in the right kidney and 5.3 cm in the left kidney. Additional tiny T2  hyperintensities in both kidneys show no definite enhancement but are technically too small to characterize. Likely benign. No followup imaging is recommended. Stomach/Bowel: Stomach is moderately distended with food. Duodenum is normally positioned as is the ligament of Treitz. No small bowel or colonic dilatation within the visualized abdomen. Vascular/Lymphatic: No abdominal aortic aneurysm. No abdominal lymphadenopathy Other:  No intraperitoneal free fluid. Musculoskeletal: No focal suspicious marrow enhancement within the visualized bony anatomy. IMPRESSION: 1. Tiny 2 mm stone in the distal common bile duct. No substantial intrahepatic biliary duct dilatation. Common bile duct measures 6 mm in the head of the pancreas. 2. Tiny stone in the neck of the gallbladder. Gallbladder wall thickness upper normal at 3 mm with increased signal in the wall the gallbladder suggesting edema. Imaging features raise concern for acute cholecystitis. 3. Hepatic steatosis. 4. Bilateral simple renal cysts. Electronically Signed   By: Kennith Center M.D.   On: 12/05/2023 09:31   CT ABDOMEN PELVIS W CONTRAST Result Date: 12/05/2023 CLINICAL DATA:  89 year old male status post right hemicolectomy for colon cancer on 04/08/2023. Abdominal pain which woke him at 0230 hours. EXAM: CT ABDOMEN AND PELVIS WITH CONTRAST TECHNIQUE: Multidetector CT imaging of the abdomen and pelvis was performed using the standard protocol following bolus administration of intravenous contrast. RADIATION DOSE REDUCTION: This exam was performed according to the departmental dose-optimization program which includes automated exposure control, adjustment of the mA and/or kV according to patient size and/or use of iterative reconstruction technique. CONTRAST:  OMNIPAQUE IOHEXOL 300 MG/ML  SOLN COMPARISON:  Preoperative CT Abdomen and Pelvis 04/06/2023 FINDINGS: Lower chest: Heart size remains normal. Calcified coronary artery atherosclerosis. No  pericardial or pleural effusion. Chronic lung base atelectasis and scarring, progressive enhancing left lung base atelectasis since June. No lung base nodule. Hepatobiliary: Stable and negative for age CT appearance of the liver and gallbladder. Pancreas: Negative. Spleen: Negative. Adrenals/Urinary Tract: Normal adrenal glands. Chronic simple and benign appearing renal cysts (no  follow up imaging recommended). Nonobstructed kidneys with symmetric renal enhancement and contrast excretion. Multifocal right nephrolithiasis. No obstructive uropathy. No left-side renal calculus. Decompressed ureters and bladder. Stomach/Bowel: Interval right hemicolectomy. Redundant splenic flexure and sigmoid colon. Small to large bowel anastomosis on coronal image 32 with no adverse features. No dilated loops. Increased small bowel containing left inguinal hernia since June (coronal image 44), but no evidence of incarceration or inflammation there. Other postoperative changes to the ventral abdominal wall with no adverse features. Retained fluid in the stomach. Duodenum is decompressed. No free air, free fluid, or mesenteric inflammation identified. Vascular/Lymphatic: Extensive Aortoiliac calcified atherosclerosis. Major arterial structures in the abdomen and pelvis remain patent. Tortuous aortoiliac vessels with no abdominal aortic aneurysm. Portal venous system appears to be patent. No lymphadenopathy is identified. Reproductive: Bilateral fat containing umbilical hernias. Increased herniation of small bowel loops on the left since June (coronal images 44 and 45). Other: No pelvis free fluid. Musculoskeletal: Stable visualized osseous structures. No acute or suspicious osseous lesion identified. IMPRESSION: 1. Interval Right hemicolectomy with no adverse features. No metastatic disease is identified. 2. Chronic small inguinal hernias, that on the left now include so more small-bowel since June, but no evidence of hernia  incarceration. No evidence of obstruction. 3. Acute on chronic lung base atelectasis, scarring. Aortic Atherosclerosis (ICD10-I70.0). Electronically Signed   By: Odessa Fleming M.D.   On: 12/05/2023 07:12   US Abdomen Limited RUQ (LIVER/GB) Result Date: 12/05/2023 CLINICAL DATA:  Elevated LFTs. EXAM: ULTRASOUND ABDOMEN LIMITED RIGHT UPPER QUADRANT COMPARISON:  CT with IV and oral contrast 04/06/2023. FINDINGS: Gallbladder: No gallstones or wall thickening visualized. No sonographic Murphy sign noted by sonographer. Common bile duct: Diameter: 2.7 mm.  No intrahepatic biliary dilatation. Liver: No focal lesion identified. The liver is diffusely echogenic consistent with steatosis. Portal vein is patent on color Doppler imaging with normal direction of blood flow towards the liver. Other: 6.4 cm simple cyst again noted upper pole right kidney. IMPRESSION: 1. No evidence of gallstones, wall thickening or biliary dilatation. 2. Hepatic steatosis. 3. 6.4 cm simple cyst upper pole right kidney. Electronically Signed   By: Almira Bar M.D.   On: 12/05/2023 05:49       Impression / Plan:   Assessment  Biliary colic with choledocholithiasis.  Had chills, elevated WBC count and borderline lipase.  MRCP also showing food in the stomach. Cholelithiasis.  Doubt acute cholecystitis Stage IIIb Cecal AdenoCa s/p R hemicolectomy 03/2023 followed by neoadjuvant chemo.  R inguinal hernia   Plan: -Clear liquid diet only.  NPO past midnight -Agree with IV Zosyn -ERCP with Dr Marina Goodell 2/24 -Would likely need lap chole thereafter -Check CBC, CMP, lipase in AM -Check CEA  -Rpt colon 03/2024 as outpt.  I have explained risks and benefits including small but definite risks of pancreatitis (<10%), bleeding (<1%), perforation (<1%). The risks of general anesthesia were also discussed by me and anesthesia staff.  The benefits were also discussed.  Patient wishes to proceed.  All the questions were answered.  Discussed with the  patient and patient's wife   Edman Circle, MD [facility] [phone]    LOS: 0 days   Lynann Bologna  12/05/2023, 11:50 AM

## 2023-12-05 NOTE — ED Notes (Signed)
 Pt back from CT

## 2023-12-05 NOTE — H&P (Signed)
 History and Physical    Patient: Chad Fisher XBJ:[id number] DOB: 1933-04-22 DOA: 12/05/2023 DOS: the patient was seen and examined on 12/05/2023 PCP: Shelva Majestic, MD  Patient coming from: Home  Chief Complaint:  Chief Complaint  Patient presents with   Abdominal Pain   Chills   HPI: Chad Fisher is a 89 y.o. male with medical history significant of skin cancer, right inguinal hernia, cataract, hypothyroidism, hyperlipidemia CKD, senile purpura, hematochezia, colon cancer, anemia, urolithiasis who presented to the emergency department with complaints of abdominal pain associated with chills, nausea and emesis that woke him up from sleep.  No fever.  He has been having on and off abdominal pain with workup showing abnormal LFTs with hyperbilirubinemia for over a month.  His symptoms and labs were improving until he started having these episodic pain.  He denied rhinorrhea, sore throat, wheezing or hemoptysis.  No chest pain, palpitations, diaphoresis, PND, orthopnea or pitting edema of the lower extremities.  No diarrhea, constipation, melena or hematochezia.  No flank pain, dysuria, frequency or hematuria.  No polyuria, polydipsia, polyphagia or blurred vision.   Lab work: Urinalysis shows small hemoglobin but was otherwise unremarkable.  Coronavirus, influenza and RSV PCR was negative.  CBC showed a white count of 12.2 with 94% neutrophils, hemoglobin 13.9 g/dL with an MCV of 308.6 fL and platelets 213.  Lipase 56 units/L.  CMP showed normal renal function and electrolytes, after calcium correction.  Glucose 144, total bilirubin 2.8, direct bilirubin 1.5, total protein 6.5 and albumin 3.4 g/dL, AST 578/ION 629/[id number] phosphatase 221 units/L.  Imaging: RUQ ultrasound showed no evidence of gallstones, wall thickening or biliary dilatation.  Hepatic steatosis.  6.4 cm previously known simple cyst upper pole of the right kidney.  CT abdomen/pelvis with contrast show interval right  hemicolectomy with no adverse features.  No metastatic disease is identified.  Chronic small inguinal hernias, diet on the left now includes all more small bowels since June, but no evidence of hernia incarceration.  No evidence of obstruction.  Acute on chronic lung base atelectasis, scarring.  Aortic atherosclerosis.  MRI/MRCP showed a 2 mm stone in the distal CBD.  No substantial intrahepatic biliary duct dilatation.  CBD measures 6 mm in the head of the pancreas.  There is a small stone in the neck of the gallbladder.  Gallbladder wall thickness upper normal at 3 mm with increased signal in the wall suggesting edema, this raises concern for acute cholecystitis.  Hepatic steatosis.  Bilateral simple renal cysts.  ED course: Initial vital signs were temperature 98.4 F, pulse 92, respirations 17, BP 156/76 mmHg O2 sat 97% on room air.  The patient received ondansetron 4 mg IVP and 500 mL of normal saline bolus.   Review of Systems: As mentioned in the history of present illness. All other systems reviewed and are negative. Past Medical History:  Diagnosis Date   Cancer [facility])    skin   Hernia 1987   right inguinal    Past Surgical History:  Procedure Laterality Date   BIOPSY  04/06/2023   Procedure: BIOPSY;  Surgeon: Hilarie Fredrickson, MD;  Location: [facility];  Service: Gastroenterology;;   CATARACT EXTRACTION, BILATERAL     COLONOSCOPY N/A 04/06/2023   Procedure: COLONOSCOPY;  Surgeon: Hilarie Fredrickson, MD;  Location: [facility];  Service: Gastroenterology;  Laterality: N/A;   HERNIA REPAIR     LAPAROTOMY N/A 04/08/2023   Procedure: EXPLORATORY LAPAROTOMY WITH RIGHT HEMICOLECTOMY;  Surgeon: Diamantina Monks,  MD;  Location: [facility];  Service: General;  Laterality: N/A;   MOHS SURGERY     left ear   POLYPECTOMY  04/06/2023   Procedure: POLYPECTOMY;  Surgeon: Hilarie Fredrickson, MD;  Location: [facility];  Service: Gastroenterology;;   SUBMUCOSAL TATTOO INJECTION  04/06/2023   Procedure: SUBMUCOSAL  TATTOO INJECTION;  Surgeon: Hilarie Fredrickson, MD;  Location: [facility];  Service: Gastroenterology;;   Social History:  reports that he has never smoked. He has never used smokeless tobacco. He reports that he does not drink alcohol and does not use drugs.  No Known Allergies  Family History  Problem Relation Age of Onset   Anemia Father    Benign prostatic hyperplasia Brother    Cancer Daughter    Breast cancer Daughter    Kidney cancer Son        doing ok    Prior to Admission medications   Medication Sig Start Date End Date Taking? Authorizing Provider  cyanocobalamin (VITAMIN B12) 1000 MCG/ML injection INJECT 1mL INTRAMUSCULARLY EVERY 30 DAYS Patient taking differently: Inject 1,000 mcg into the muscle every 30 (thirty) days. 12/01/22  Yes Shelva Majestic, MD  levothyroxine (SYNTHROID) 75 MCG tablet TAKE 1 TABLET BY MOUTH DAILY 08/23/23  Yes Shelva Majestic, MD  Potassium Citrate 15 MEQ (1620 MG) TBCR Take 1 tablet by mouth daily. 09/22/18  Yes [provider]  simvastatin (ZOCOR) 20 MG tablet TAKE 1 TABLET BY MOUTH DAILY 08/23/23  Yes Shelva Majestic, MD  nitrofurantoin, macrocrystal-monohydrate, (MACROBID) 100 MG capsule Take 1 capsule (100 mg total) by mouth 2 (two) times daily. Patient not taking: Reported on 12/05/2023 11/25/23   Ardith Dark, MD    Physical Exam: Vitals:   12/05/23 0323 12/05/23 0324 12/05/23 0615  BP: (!) 156/76  101/65  Pulse: 92  90  Resp: 17  18  Temp: 98.4 F (36.9 C)    TempSrc: Oral    SpO2: 97%  93%  Weight:  65.3 kg   Height:  5\' 8"  (1.727 m)    Physical Exam Vitals reviewed.  Constitutional:      General: He is awake. He is not in acute distress.    Appearance: He is well-developed. He is ill-appearing.  HENT:     Head: Normocephalic.     Nose: No rhinorrhea.     Mouth/Throat:     Mouth: Mucous membranes are moist.  Eyes:     General: No scleral icterus.    Pupils: Pupils are equal, round, and reactive to light.   Neck:     Vascular: No JVD.  Cardiovascular:     Rate and Rhythm: Normal rate and regular rhythm.     Heart sounds: S1 normal and S2 normal.  Pulmonary:     Effort: Pulmonary effort is normal.     Breath sounds: Normal breath sounds. No wheezing, rhonchi or rales.  Abdominal:     General: Bowel sounds are normal.     Palpations: Abdomen is soft.     Tenderness: There is abdominal tenderness in the right upper quadrant. There is no right CVA tenderness or left CVA tenderness.  Musculoskeletal:     Cervical back: Neck supple.     Right lower leg: No edema.     Left lower leg: No edema.  Skin:    General: Skin is warm and dry.  Neurological:     General: No focal deficit present.     Mental Status: He is alert and  oriented to person, place, and time.  Psychiatric:        Mood and Affect: Mood normal.        Behavior: Behavior normal. Behavior is cooperative.     Data Reviewed:  Results are pending, will review when available.  Assessment and Plan: Principal Problem:   Hyperbilirubinemia Associated with:   RUQ abdominal pain  In the setting of:   Cholelithiasis (of the gallbladder neck) And:   Choledocholithiasis Without significant CBD dilatation.Observation/MedSurg. Continue IV fluids. Clear liquid diet. Keep n.p.o. after midnight. Analgesics as needed. Antiemetics as needed. Will cover for cholecystitis with Zosyn. Follow CBC, CMP in AM. GI consult appreciated. -ERCP tomorrow. -Consult general surgery afterwards  Active Problems:   Hyperlipidemia Hold statin due to abnormal LFTs.    Hypothyroidism Continue levothyroxine 75 mcg p.o. daily.    CKD (chronic kidney disease), stage III (HCC) Renal function is normal.    Macrocytosis Anemia panel showed iron deficiency. Normal or higher than normal B12 level. Normal folic acid level.     Advance Care Planning:   Code Status: Full Code   Consults: [facility] Lynann Bologna, MD).  Family  Communication:   Severity of Illness: The appropriate patient status for this patient is OBSERVATION. Observation status is judged to be reasonable and necessary in order to provide the required intensity of service to ensure the patient's safety. The patient's presenting symptoms, physical exam findings, and initial radiographic and laboratory data in the context of their medical condition is felt to place them at decreased risk for further clinical deterioration. Furthermore, it is anticipated that the patient will be medically stable for discharge from the hospital within 2 midnights of admission.   Author: Bobette Mo, MD 12/05/2023 8:06 AM  For on call review [email or web address].   This document was prepared using Dragon voice recognition software and may contain some unintended transcription errors.

## 2023-12-05 NOTE — ED Triage Notes (Addendum)
  Patient comes in with generalized abdominal pain that he states woke him up from sleeping around 0230 this morning.  Patient also endorses chills.  Patient recently being worked up for gallstones and has [place] scheduled on 2/25.  Pain 7/10, sharp.  Endorses N/V before arrival.

## 2023-12-05 NOTE — Plan of Care (Signed)

## 2023-12-05 NOTE — ED Notes (Signed)
 Patient transported to CT

## 2023-12-06 ENCOUNTER — Observation Stay (HOSPITAL_COMMUNITY): Payer: Medicare Other

## 2023-12-06 ENCOUNTER — Encounter (HOSPITAL_COMMUNITY)
Admission: EM | Disposition: A | Discharge status: Home / Self Care | Payer: Self-pay | Source: Home / Self Care | Attending: Student

## 2023-12-06 ENCOUNTER — Encounter (HOSPITAL_COMMUNITY): Payer: Self-pay

## 2023-12-06 DIAGNOSIS — K8050 Calculus of bile duct without cholangitis or cholecystitis without obstruction: Secondary | ICD-10-CM | POA: Diagnosis not present

## 2023-12-06 DIAGNOSIS — Z79890 Hormone replacement therapy (postmenopausal): Secondary | ICD-10-CM | POA: Diagnosis not present

## 2023-12-06 DIAGNOSIS — R932 Abnormal findings on diagnostic imaging of liver and biliary tract: Secondary | ICD-10-CM | POA: Diagnosis not present

## 2023-12-06 DIAGNOSIS — Z85038 Personal history of other malignant neoplasm of large intestine: Secondary | ICD-10-CM | POA: Diagnosis not present

## 2023-12-06 DIAGNOSIS — E785 Hyperlipidemia, unspecified: Secondary | ICD-10-CM | POA: Diagnosis present

## 2023-12-06 DIAGNOSIS — K4090 Unilateral inguinal hernia, without obstruction or gangrene, not specified as recurrent: Secondary | ICD-10-CM | POA: Diagnosis present

## 2023-12-06 DIAGNOSIS — E039 Hypothyroidism, unspecified: Secondary | ICD-10-CM | POA: Diagnosis not present

## 2023-12-06 DIAGNOSIS — K8042 Calculus of bile duct with acute cholecystitis without obstruction: Secondary | ICD-10-CM | POA: Diagnosis not present

## 2023-12-06 DIAGNOSIS — Z85828 Personal history of other malignant neoplasm of skin: Secondary | ICD-10-CM | POA: Diagnosis not present

## 2023-12-06 DIAGNOSIS — E782 Mixed hyperlipidemia: Secondary | ICD-10-CM | POA: Diagnosis not present

## 2023-12-06 DIAGNOSIS — D7589 Other specified diseases of blood and blood-forming organs: Secondary | ICD-10-CM | POA: Diagnosis not present

## 2023-12-06 DIAGNOSIS — Z8051 Family history of malignant neoplasm of kidney: Secondary | ICD-10-CM | POA: Diagnosis not present

## 2023-12-06 DIAGNOSIS — K8020 Calculus of gallbladder without cholecystitis without obstruction: Secondary | ICD-10-CM | POA: Diagnosis not present

## 2023-12-06 DIAGNOSIS — R7989 Other specified abnormal findings of blood chemistry: Principal | ICD-10-CM | POA: Diagnosis present

## 2023-12-06 DIAGNOSIS — D696 Thrombocytopenia, unspecified: Secondary | ICD-10-CM | POA: Diagnosis not present

## 2023-12-06 DIAGNOSIS — Z9221 Personal history of antineoplastic chemotherapy: Secondary | ICD-10-CM | POA: Diagnosis not present

## 2023-12-06 DIAGNOSIS — N179 Acute kidney failure, unspecified: Secondary | ICD-10-CM | POA: Diagnosis not present

## 2023-12-06 DIAGNOSIS — K8000 Calculus of gallbladder with acute cholecystitis without obstruction: Secondary | ICD-10-CM | POA: Diagnosis present

## 2023-12-06 DIAGNOSIS — R1011 Right upper quadrant pain: Secondary | ICD-10-CM | POA: Diagnosis not present

## 2023-12-06 DIAGNOSIS — K812 Acute cholecystitis with chronic cholecystitis: Secondary | ICD-10-CM | POA: Diagnosis not present

## 2023-12-06 DIAGNOSIS — K8021 Calculus of gallbladder without cholecystitis with obstruction: Secondary | ICD-10-CM | POA: Diagnosis not present

## 2023-12-06 DIAGNOSIS — Z803 Family history of malignant neoplasm of breast: Secondary | ICD-10-CM | POA: Diagnosis not present

## 2023-12-06 DIAGNOSIS — K838 Other specified diseases of biliary tract: Secondary | ICD-10-CM | POA: Diagnosis not present

## 2023-12-06 DIAGNOSIS — Z1152 Encounter for screening for COVID-19: Secondary | ICD-10-CM | POA: Diagnosis not present

## 2023-12-06 DIAGNOSIS — K7460 Unspecified cirrhosis of liver: Secondary | ICD-10-CM | POA: Diagnosis present

## 2023-12-06 DIAGNOSIS — R918 Other nonspecific abnormal finding of lung field: Secondary | ICD-10-CM | POA: Diagnosis not present

## 2023-12-06 DIAGNOSIS — Z79899 Other long term (current) drug therapy: Secondary | ICD-10-CM | POA: Diagnosis not present

## 2023-12-06 DIAGNOSIS — R17 Unspecified jaundice: Secondary | ICD-10-CM | POA: Diagnosis not present

## 2023-12-06 DIAGNOSIS — N1832 Chronic kidney disease, stage 3b: Secondary | ICD-10-CM | POA: Diagnosis not present

## 2023-12-06 DIAGNOSIS — K8001 Calculus of gallbladder with acute cholecystitis with obstruction: Secondary | ICD-10-CM | POA: Diagnosis not present

## 2023-12-06 DIAGNOSIS — K8010 Calculus of gallbladder with chronic cholecystitis without obstruction: Secondary | ICD-10-CM | POA: Diagnosis not present

## 2023-12-06 HISTORY — PX: SPHINCTEROTOMY: SHX5279

## 2023-12-06 HISTORY — PX: ERCP: SHX5425

## 2023-12-06 HISTORY — PX: REMOVAL OF STONES: SHX5545

## 2023-12-06 LAB — CBC
HCT: 35.1 % — ABNORMAL LOW (ref 39.0–52.0)
Hemoglobin: 11.7 g/dL — ABNORMAL LOW (ref 13.0–17.0)
MCH: 36.7 pg — ABNORMAL HIGH (ref 26.0–34.0)
MCHC: 33.3 g/dL (ref 30.0–36.0)
MCV: 110 fL — ABNORMAL HIGH (ref 80.0–100.0)
Platelets: 152 10*3/uL (ref 150–400)
RBC: 3.19 MIL/uL — ABNORMAL LOW (ref 4.22–5.81)
RDW: 14.5 % (ref 11.5–15.5)
WBC: 12.9 10*3/uL — ABNORMAL HIGH (ref 4.0–10.5)
nRBC: 0 % (ref 0.0–0.2)

## 2023-12-06 LAB — COMPREHENSIVE METABOLIC PANEL
ALT: 215 U/L — ABNORMAL HIGH (ref 0–44)
AST: 164 U/L — ABNORMAL HIGH (ref 15–41)
Albumin: 2.7 g/dL — ABNORMAL LOW (ref 3.5–5.0)
Alkaline Phosphatase: 178 U/L — ABNORMAL HIGH (ref 38–126)
Anion gap: 7 (ref 5–15)
BUN: 20 mg/dL (ref 8–23)
CO2: 26 mmol/L (ref 22–32)
Calcium: 8.3 mg/dL — ABNORMAL LOW (ref 8.9–10.3)
Chloride: 104 mmol/L (ref 98–111)
Creatinine, Ser: 1.24 mg/dL (ref 0.61–1.24)
GFR, Estimated: 56 mL/min — ABNORMAL LOW (ref >60)
Glucose, Bld: 92 mg/dL (ref 70–99)
Potassium: 4.7 mmol/L (ref 3.5–5.1)
Sodium: 137 mmol/L (ref 135–145)
Total Bilirubin: 7.3 mg/dL — ABNORMAL HIGH (ref 0.0–1.2)
Total Protein: 5.5 g/dL — ABNORMAL LOW (ref 6.5–8.1)

## 2023-12-06 LAB — LIPASE, BLOOD: Lipase: 29 U/L (ref 11–51)

## 2023-12-06 SURGERY — ERCP, WITH INTERVENTION IF INDICATED
Anesthesia: General

## 2023-12-06 MED ORDER — GLUCAGON HCL RDNA (DIAGNOSTIC) 1 MG IJ SOLR
INTRAMUSCULAR | Status: DC | PRN
  Administered 2023-12-06: .5 mg via INTRAVENOUS

## 2023-12-06 MED ORDER — INDOMETHACIN 50 MG RE SUPP
RECTAL | Status: AC
  Filled 2023-12-06: qty 2

## 2023-12-06 MED ORDER — LIDOCAINE 2% (20 MG/ML) 5 ML SYRINGE
INTRAMUSCULAR | Status: DC | PRN
  Administered 2023-12-06: 100 mg via INTRAVENOUS

## 2023-12-06 MED ORDER — PHENYLEPHRINE HCL (PRESSORS) 10 MG/ML IV SOLN: INTRAVENOUS | Status: DC | PRN

## 2023-12-06 MED ORDER — ORAL CARE MOUTH RINSE: 15.0000 mL | OROMUCOSAL | Status: DC | PRN

## 2023-12-06 MED ORDER — ROCURONIUM BROMIDE 100 MG/10ML IV SOLN
INTRAVENOUS | Status: DC | PRN
Start: 2023-12-06 — End: 2023-12-06
  Administered 2023-12-06: 50 mg via INTRAVENOUS

## 2023-12-06 MED ORDER — ONDANSETRON HCL 4 MG/2ML IJ SOLN
INTRAMUSCULAR | Status: DC | PRN
  Administered 2023-12-06: 4 mg via INTRAVENOUS

## 2023-12-06 MED ORDER — PROPOFOL 10 MG/ML IV BOLUS
INTRAVENOUS | Status: DC | PRN
Start: 2023-12-06 — End: 2023-12-06
  Administered 2023-12-06: 100 mg via INTRAVENOUS

## 2023-12-06 MED ORDER — EPHEDRINE SULFATE-NACL 50-0.9 MG/10ML-% IV SOSY
PREFILLED_SYRINGE | INTRAVENOUS | Status: DC | PRN
  Administered 2023-12-06: 5 mg via INTRAVENOUS

## 2023-12-06 MED ORDER — DICLOFENAC SUPPOSITORY 100 MG
RECTAL | Status: AC
  Filled 2023-12-06: qty 1

## 2023-12-06 MED ORDER — PROPOFOL 10 MG/ML IV BOLUS
INTRAVENOUS | Status: AC
Start: 2023-12-06 — End: ?
  Filled 2023-12-06: qty 20

## 2023-12-06 MED ORDER — PHENYLEPHRINE 80 MCG/ML (10ML) SYRINGE FOR IV PUSH (FOR BLOOD PRESSURE SUPPORT)
PREFILLED_SYRINGE | INTRAVENOUS | Status: DC | PRN
  Administered 2023-12-06: 80 ug via INTRAVENOUS
  Administered 2023-12-06 (×2): 160 ug via INTRAVENOUS

## 2023-12-06 MED ORDER — SUGAMMADEX SODIUM 200 MG/2ML IV SOLN
INTRAVENOUS | Status: DC | PRN
  Administered 2023-12-06: 200 mg via INTRAVENOUS

## 2023-12-06 MED ORDER — LACTATED RINGERS IV SOLN
INTRAVENOUS | Status: AC | PRN
  Administered 2023-12-06: 1000 mL via INTRAVENOUS

## 2023-12-06 MED ORDER — SODIUM CHLORIDE 0.9 % IV SOLN
INTRAVENOUS | Status: DC | PRN
  Administered 2023-12-06: 40 mL

## 2023-12-06 MED ORDER — GLUCAGON HCL RDNA (DIAGNOSTIC) 1 MG IJ SOLR
INTRAMUSCULAR | Status: AC
  Filled 2023-12-06: qty 1

## 2023-12-06 MED ORDER — INDOMETHACIN 50 MG RE SUPP
RECTAL | Status: DC | PRN
  Administered 2023-12-06: 100 mg via RECTAL

## 2023-12-06 NOTE — Plan of Care (Signed)

## 2023-12-06 NOTE — Op Note (Signed)
 [facility] Patient Name: Chad Fisher Procedure Date: 12/06/2023 MRN: [id number] Attending MD: Wilhemina Bonito. Marina Goodell , MD, [id number] Date of Birth: 18-Jan-1933 CSN: [id number] Age: 89 Admit Type: Inpatient Procedure:                ERCP with biliary sphincterotomy and common duct                            stone extraction Indications:              Abdominal pain in the right upper quadrant, Biliary                            dilation on Computed Tomogram Scan, Bile duct stone                            on Computed Tomogram Scan, Abnormal liver function                            test Providers:                Wilhemina Bonito. Marina Goodell, MD, Lorenza Evangelist, RN, Salley Scarlet, Technician, Arne Cleveland, CRNA Referring MD:             [facility] Medicines:                General Anesthesia Complications:            No immediate complications. Estimated Blood Loss:     Estimated blood loss: none. Procedure:                Pre-Anesthesia Assessment:                           - Prior to the procedure, a History and Physical                            was performed, and patient medications and                            allergies were reviewed. The patient is competent.                            The risks and benefits of the procedure and the                            sedation options and risks were discussed with the                            patient. All questions were answered and informed                            consent was obtained. Patient identification and  proposed procedure were verified by the physician.                            Mental Status Examination: alert and oriented.                            Airway Examination: normal oropharyngeal airway and                            neck mobility. Respiratory Examination: clear to                            auscultation. CV Examination: normal. Prophylactic                             Antibiotics: The patient does not require                            prophylactic antibiotics. Prior Anticoagulants: The                            patient has taken no anticoagulant or antiplatelet                            agents. ASA Grade Assessment: II - A patient with                            mild systemic disease. After reviewing the risks                            and benefits, the patient was deemed in                            satisfactory condition to undergo the procedure.                            The anesthesia plan was to use moderate sedation /                            analgesia (conscious sedation). Immediately prior                            to administration of medications, the patient was                            re-assessed for adequacy to receive sedatives. The                            heart rate, respiratory rate, oxygen saturations,                            blood pressure, adequacy of pulmonary ventilation,  and response to care were monitored throughout the                            procedure. The physical status of the patient was                            re-assessed after the procedure.                           After obtaining informed consent, the scope was                            passed under direct vision. Throughout the                            procedure, the patient's blood pressure, pulse, and                            oxygen saturations were monitored continuously. The                            [id number]-Q190V ([id number] Olympus duodenoscope was                            introduced through the mouth, and used to inject                            contrast into and used to inject contrast into the                            bile duct and ventral pancreatic duct. The ERCP was                            accomplished without difficulty. The patient                            tolerated the procedure  well. Scope In: Scope Out: Findings:      1. The esophagus was blindly intubated with the side-viewing endoscope.       The stomach and duodenum, as well as the major ampulla were grossly       normal. The minor ampulla was not sought.      2. A scout radiograph of the abdomen with the endoscope in position was       unremarkable.      3. Initial injection of contrast yielded a normal pancreatogram.      4. Several attempts were made to cannulate the bile duct with a       guidewire, but were unsuccessful. As such, a wire was placed into the       pancreatic duct and the 2 wire approach was used to successfully       cannulate the common bile duct. The pancreatic duct readily drained and       the pancreatic duct wire was removed.      5. Filling of contrast throughout the biliary tree demonstrated mild  extrahepatic ductal dilation with a 4 mm filling defect in the       midportion. The cystic duct filled.      6. A biliary sphincterotomy was made cutting over the hydrophilic       guidewire using the ERBE system with cutting in the 12:00 orientation.       No significant bleeding.      7. A sequential balloon up to 12 mm was used to extract a pigmented       stone. No additional filling defects in the common duct. Good drainage. Impression:               1. Choledocholithiasis status post ERCP with                            sphincterotomy and stone extraction                           2. Cholelithiasis. Moderate Sedation:      none Recommendation:           1. Standard post ERCP care                           2. Continue antibiotics                           3. Consult surgery for laparoscopic cholecystectomy                           Findings and plans discussed with the patient's                            wife Chad Fisher by telephone just now. [phone]. Procedure Code(s):        --- Professional ---                           [phone], Endoscopic retrograde                             cholangiopancreatography (ERCP); with removal of                            calculi/debris from biliary/pancreatic duct(s)                           43262, Endoscopic retrograde                            cholangiopancreatography (ERCP); with                            sphincterotomy/papillotomy Diagnosis Code(s):        --- Professional ---                           K83.8, Other specified diseases of biliary tract                           K80.50, Calculus of bile duct without  cholangitis                            or cholecystitis without obstruction                           R10.11, Right upper quadrant pain                           R79.89, Other specified abnormal findings of blood                            chemistry CPT copyright 2022 American Medical Association. All rights reserved. The codes documented in this report are preliminary and upon coder review may  be revised to meet current compliance requirements. Wilhemina Bonito. Marina Goodell, MD 12/06/2023 2:50:52 PM This report has been signed electronically. Number of Addenda: 0

## 2023-12-06 NOTE — Progress Notes (Signed)
 [facility]   Pre-procedure check:   Patient for ERCP today  Afebrile, BP low( 90's / 50's) but stable  Tbili up overnight from 2.8 >> 7.3  WBC 12.9. On Zosyn  He is NPO. No complaints. Feels okay. He has no remaining questions about the ERCP

## 2023-12-06 NOTE — Anesthesia Postprocedure Evaluation (Signed)
 Anesthesia Post Note  Patient: Chad Fisher  Procedure(s) Performed: ENDOSCOPIC RETROGRADE CHOLANGIOPANCREATOGRAPHY (ERCP) SPHINCTEROTOMY REMOVAL OF STONES     Patient location during evaluation: PACU Anesthesia Type: General Level of consciousness: awake and alert, oriented and patient cooperative Pain management: pain level controlled Vital Signs Assessment: post-procedure vital signs reviewed and stable Respiratory status: spontaneous breathing, nonlabored ventilation and respiratory function stable Cardiovascular status: blood pressure returned to baseline and stable Postop Assessment: no apparent nausea or vomiting Anesthetic complications: no   No notable events documented.  Last Vitals:  Vitals:   12/06/23 1455 12/06/23 1500  BP:    Pulse: (!) 52 (!) 54  Resp: 16 15  Temp:    SpO2: 94% 95%    Last Pain:  Vitals:   12/06/23 1430  TempSrc:   PainSc: 0-No pain                 [name]

## 2023-12-06 NOTE — Progress Notes (Signed)
   12/06/23 0948  TOC Brief Assessment  Insurance and Status Reviewed  Patient has primary care physician Yes  Home environment has been reviewed home w/ spouse  Prior level of function: independent  Prior/Current Home Services No current home services  Social Drivers of Health Review SDOH reviewed no interventions necessary  Readmission risk has been reviewed Yes  Transition of care needs transition of care needs identified, TOC will continue to follow

## 2023-12-06 NOTE — Care Management Obs Status (Signed)
 MEDICARE OBSERVATION STATUS NOTIFICATION   Patient Details  Name: Chad Fisher MRN: [id number] Date of Birth: 1933/07/24   Medicare Observation Status Notification Given:  Yes    Diona Browner, LCSW 12/06/2023, 9:43 AM

## 2023-12-06 NOTE — Progress Notes (Signed)
 PROGRESS NOTE  Chad Fisher  NFA:[id number] DOB: Jul 23, 1933 DOA: 12/05/2023 PCP: Shelva Majestic, MD  Consultants  Brief Narrative: 89 y.o. male with a PMH significant for skin cancer, right inguinal hernia, cataract, hypothyroidism, hyperlipidemia CKD, senile purpura, hematochezia, colon cancer, anemia, urolithiasis who presented to the emergency department with complaints of abdominal pain associated with chills, nausea and emesis that woke him up from sleep.  No fever.  He has been having on and off abdominal pain with workup showing abnormal LFTs with hyperbilirubinemia for over a month.  His symptoms and labs were improving until he started having these episodic pain.  Came to ER and MRCP showed 2 mm stone in distal and common bile duct.  With elevated temp and white count.  Admitted to [facility] hospitalist and GI consulted.   Assessment & Plan:   Choledocholithiasis Without clear evidence of cholecystitis although white count was elevated and borderline lipase and chills. -For ERCP today.  Appreciate GI input. -Will likely need lap chole prior to discharge.  Will consult surgery pending ERCP. -Follow electrolytes.     Hyperlipidemia Hold statin due to abnormal LFTs.     Hypothyroidism Continue levothyroxine 75 mcg p.o. daily.     CKD (chronic kidney disease), stage III (HCC) Renal function is normal.     Macrocytosis Anemia panel showed iron deficiency. Normal or higher than normal B12 level. Normal folic acid level.  Leukocytosis: -Trend. -With choledocholithiasis. -Very well-appearing on exam.  No other evidence of infection.         DVT prophylaxis:  SCDs Start: 12/05/23 4696  Code Status:   Code Status: Full Code Level of care: Med-Surg Status is: Inpatient   Consults called: Gastroenterology  Subjective: Patient seen and examined this morning about midmorning.  Reports his pain has resolved.  No nausea or vomiting.  Ready for procedure  today.  Objective: Vitals:   12/06/23 1445 12/06/23 1450 12/06/23 1455 12/06/23 1500  BP:  (!) 132/50    Pulse: (!) 54 (!) 52 (!) 52 (!) 54  Resp: 14 14 16 15   Temp:      TempSrc:      SpO2: 98% 94% 94% 95%  Weight:      Height:        Intake/Output Summary (Last 24 hours) at 12/06/2023 1659 Last data filed at 12/06/2023 1422 Gross per 24 hour  Intake 803 ml  Output 1050 ml  Net -247 ml   Filed Weights   12/05/23 0324  Weight: 65.3 kg   Body mass index is 21.9 kg/m.  Gen: 89 y.o. male in no apparent distress.  Nontoxic Pulm: Non-labored breathing.  Clear to auscultation bilaterally.  CV: Regular rate and rhythm. No murmur, rub, or gallop. No JVD GI: Abdomen soft, somewhat tender right upper quadrant but otherwise benign.  Good bowel sounds throughout. Ext: Warm, no deformities, no pedal edema Skin: No rashes, lesions no ulcers Neuro: Alert and oriented. No focal neurological deficits. Psych: Calm  Judgement and insight appear normal. Mood & affect appropriate.     I have personally reviewed the following labs and images: CBC: Recent Labs  Lab 12/05/23 0355 12/06/23 0409  WBC 12.2* 12.9*  NEUTROABS 11.5*  --   HGB 13.9 11.7*  HCT 41.8 35.1*  MCV 107.7* 110.0*  PLT 213 152   BMP &GFR Recent Labs  Lab 11/30/23 0807 12/05/23 0355 12/06/23 0409  NA 143 137 137  K 4.0 4.5 4.7  CL 105 102 104  CO2 30  25 26  GLUCOSE 124* 144* 92  BUN 26* 19 20  CREATININE 1.10 0.97 1.24  CALCIUM 9.0 8.5* 8.3*   Estimated Creatinine Clearance: 38 mL/min (by C-G formula based on SCr of 1.24 mg/dL). Liver & Pancreas: Recent Labs  Lab 11/30/23 0807 12/05/23 0355 12/06/23 0409  AST 32 353* 164*  ALT 70* 213* 215*  ALKPHOS 184* 221* 178*  BILITOT 1.3*  1.4* 2.8* 7.3*  PROT 7.0 6.5 5.5*  ALBUMIN 3.9 3.4* 2.7*   Recent Labs  Lab 11/30/23 0807 12/05/23 0355 12/06/23 0409  LIPASE 37.0 56* 29   No results for input(s): "AMMONIA" in the last 168 hours. Diabetic: No  results for input(s): "HGBA1C" in the last 72 hours. No results for input(s): "GLUCAP" in the last 168 hours. Cardiac Enzymes: No results for input(s): "CKTOTAL", "CKMB", "CKMBINDEX", "TROPONINI" in the last 168 hours. No results for input(s): "PROBNP" in the last 8760 hours. Coagulation Profile: Recent Labs  Lab 11/30/23 0807  INR 1.0   Thyroid Function Tests: No results for input(s): "TSH", "T4TOTAL", "FREET4", "T3FREE", "THYROIDAB" in the last 72 hours. Lipid Profile: No results for input(s): "CHOL", "HDL", "LDLCALC", "TRIG", "CHOLHDL", "LDLDIRECT" in the last 72 hours. Anemia Panel: No results for input(s): "VITAMINB12", "FOLATE", "FERRITIN", "TIBC", "IRON", "RETICCTPCT" in the last 72 hours. Urine analysis:    Component Value Date/Time   COLORURINE YELLOW 12/05/2023 0335   APPEARANCEUR CLEAR 12/05/2023 0335   LABSPEC 1.017 12/05/2023 0335   PHURINE 6.0 12/05/2023 0335   GLUCOSEU NEGATIVE 12/05/2023 0335   GLUCOSEU >=1000 (A) 11/25/2023 1027   HGBUR SMALL (A) 12/05/2023 0335   HGBUR negative 09/24/2010 1004   BILIRUBINUR NEGATIVE 12/05/2023 0335   BILIRUBINUR positive 11/25/2023 0936   KETONESUR NEGATIVE 12/05/2023 0335   PROTEINUR NEGATIVE 12/05/2023 0335   UROBILINOGEN 0.2 11/25/2023 1027   UROBILINOGEN 2.0 (A) 11/25/2023 0936   NITRITE NEGATIVE 12/05/2023 0335   LEUKOCYTESUR NEGATIVE 12/05/2023 0335   LEUKOCYTESUR Negative 01/24/2018 0000   Sepsis Labs: Invalid input(s): "PROCALCITONIN", "LACTICIDVEN"  Microbiology: Recent Results (from the past 240 hours)  Resp panel by RT-PCR (RSV, Flu A&B, Covid) Anterior Nasal Swab     Status: None   Collection Time: 12/05/23  3:36 AM   Specimen: Anterior Nasal Swab  Result Value Ref Range Status   SARS Coronavirus 2 by RT PCR NEGATIVE NEGATIVE Final    Comment: (NOTE) SARS-CoV-2 target nucleic acids are NOT DETECTED.  The SARS-CoV-2 RNA is generally detectable in upper respiratory specimens during the acute phase of  infection. The lowest concentration of SARS-CoV-2 viral copies this assay can detect is 138 copies/mL. A negative result does not preclude SARS-Cov-2 infection and should not be used as the sole basis for treatment or other patient management decisions. A negative result may occur with  improper specimen collection/handling, submission of specimen other than nasopharyngeal swab, presence of viral mutation(s) within the areas targeted by this assay, and inadequate number of viral copies(<138 copies/mL). A negative result must be combined with clinical observations, patient history, and epidemiological information. The expected result is Negative.  Fact Sheet for Patients:  BloggerCourse.com  Fact Sheet for Healthcare Providers:  SeriousBroker.it  This test is no t yet approved or cleared by the [place] FDA and  has been authorized for detection and/or diagnosis of SARS-CoV-2 by FDA under an Emergency Use Authorization (EUA). This EUA will remain  in effect (meaning this test can be used) for the duration of the COVID-19 declaration under Section 564(b)(1) of the Act, 21  U.S.C.section 360bbb-3(b)(1), unless the authorization is terminated  or revoked sooner.       Influenza A by PCR NEGATIVE NEGATIVE Final   Influenza B by PCR NEGATIVE NEGATIVE Final    Comment: (NOTE) The Xpert Xpress SARS-CoV-2/FLU/RSV plus assay is intended as an aid in the diagnosis of influenza from Nasopharyngeal swab specimens and should not be used as a sole basis for treatment. Nasal washings and aspirates are unacceptable for Xpert Xpress SARS-CoV-2/FLU/RSV testing.  Fact Sheet for Patients: BloggerCourse.com  Fact Sheet for Healthcare Providers: SeriousBroker.it  This test is not yet approved or cleared by the [place] FDA and has been authorized for detection and/or diagnosis of SARS-CoV-2  by FDA under an Emergency Use Authorization (EUA). This EUA will remain in effect (meaning this test can be used) for the duration of the COVID-19 declaration under Section 564(b)(1) of the Act, 21 U.S.C. section 360bbb-3(b)(1), unless the authorization is terminated or revoked.     Resp Syncytial Virus by PCR NEGATIVE NEGATIVE Final    Comment: (NOTE) Fact Sheet for Patients: BloggerCourse.com  Fact Sheet for Healthcare Providers: SeriousBroker.it  This test is not yet approved or cleared by the [place] FDA and has been authorized for detection and/or diagnosis of SARS-CoV-2 by FDA under an Emergency Use Authorization (EUA). This EUA will remain in effect (meaning this test can be used) for the duration of the COVID-19 declaration under Section 564(b)(1) of the Act, 21 U.S.C. section 360bbb-3(b)(1), unless the authorization is terminated or revoked.  Performed at [facility], [address] [place], [place] [address]     Radiology Studies: DG C-Arm 1-60 Min-No Report Result Date: 12/06/2023 Fluoroscopy was utilized by the requesting physician.  No radiographic interpretation.    Scheduled Meds: Continuous Infusions:  piperacillin-tazobactam (ZOSYN)  IV 3.375 g (12/06/23 1524)     LOS: 0 days   55 minutes with more than 50% spent in reviewing records, counseling patient/family and coordinating care.  Tobey Grim, MD [facility] [email or web address] 12/06/2023, 4:59 PM

## 2023-12-06 NOTE — Plan of Care (Signed)

## 2023-12-06 NOTE — Anesthesia Procedure Notes (Signed)
 Procedure Name: Intubation Date/Time: 12/06/2023 1:10 PM  Performed by: Lezlie Lye, CRNAPre-anesthesia Checklist: Patient identified, Emergency Drugs available, Suction available and Patient being monitored Patient Re-evaluated:Patient Re-evaluated prior to induction Oxygen Delivery Method: Circle system utilized Preoxygenation: Pre-oxygenation with 100% oxygen Induction Type: IV induction Ventilation: Mask ventilation without difficulty Laryngoscope Size: 3 and Glidescope Grade View: Grade I Tube type: Oral Tube size: 7.5 mm Number of attempts: 2 Airway Equipment and Method: Stylet Placement Confirmation: ETT inserted through vocal cords under direct vision, positive ETCO2 and breath sounds checked- equal and bilateral Secured at: 22 cm Tube secured with: Tape Dental Injury: Teeth and Oropharynx as per pre-operative assessment  Difficulty Due To: Difficult Airway- due to reduced neck mobility Comments: DL x 1, Miller 3, unable to visualize cords; DL x 1 Glide with ease

## 2023-12-06 NOTE — H&P (View-Only) (Signed)
 [facility]   Pre-procedure check:   Patient for ERCP today  Afebrile, BP low( 90's / 50's) but stable  Tbili up overnight from 2.8 >> 7.3  WBC 12.9. On Zosyn  He is NPO. No complaints. Feels okay. He has no remaining questions about the ERCP

## 2023-12-06 NOTE — Interval H&P Note (Signed)
 History and Physical Interval Note:  12/06/2023 1:15 PM  Chad Fisher  has presented today for surgery, with the diagnosis of Biliary colic.  I saw the patient in the endoscopy admissions area preprocedure to review his clinical problem and the endoscopic plans.  The various methods of treatment have been discussed with the patient and family. After consideration of risks, benefits and other options for treatment, the patient has consented to  Procedure(s): ENDOSCOPIC RETROGRADE CHOLANGIOPANCREATOGRAPHY (ERCP) (N/A) as a surgical intervention.  The patient's history has been reviewed, patient examined, no change in status, stable for surgery.  I have reviewed the patient's chart and labs.  Questions were answered to the patient's satisfaction.     Chad Fisher

## 2023-12-06 NOTE — Transfer of Care (Signed)
 Immediate Anesthesia Transfer of Care Note  Patient: Chad Fisher  Procedure(s) Performed: ENDOSCOPIC RETROGRADE CHOLANGIOPANCREATOGRAPHY (ERCP) SPHINCTEROTOMY REMOVAL OF STONES  Patient Location: PACU and Endoscopy Unit  Anesthesia Type:General  Level of Consciousness: drowsy  Airway & Oxygen Therapy: Patient Spontanous Breathing and Patient connected to face mask oxygen  Post-op Assessment: Report given to RN and Post -op Vital signs reviewed and stable  Post vital signs: Reviewed and stable  Last Vitals:  Vitals Value Taken Time  BP 145/61   Temp 97   Pulse 61   Resp 18   SpO2 100     Last Pain:  Vitals:   12/06/23 1213  TempSrc: Temporal  PainSc: 0-No pain         Complications: No notable events documented.

## 2023-12-06 NOTE — Anesthesia Preprocedure Evaluation (Addendum)
 Anesthesia Evaluation  Patient identified by MRN, date of birth, ID band Patient awake    Reviewed: Allergy & Precautions, H&P , NPO status , Patient's Chart, lab work & pertinent test results  Airway Mallampati: III  TM Distance: >3 FB Neck ROM: Full    Dental  (+) Teeth Intact, Dental Advisory Given   Pulmonary neg pulmonary ROS   Pulmonary exam normal breath sounds clear to auscultation       Cardiovascular + Peripheral Vascular Disease  Normal cardiovascular exam Rhythm:Regular Rate:Normal     Neuro/Psych negative neurological ROS  negative psych ROS   GI/Hepatic Neg liver ROS,,,Biliary colic  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 followed by neoadjuvant chemo (last dose 09/2023)   Endo/Other  negative endocrine ROSHypothyroidism    Renal/GU negative Renal ROS  negative genitourinary   Musculoskeletal negative musculoskeletal ROS (+)    Abdominal   Peds negative pediatric ROS (+)  Hematology negative hematology ROS (+) Hb 11.7, plt 152   Anesthesia Other Findings   Reproductive/Obstetrics negative OB ROS                             Anesthesia Physical Anesthesia Plan  ASA: 2  Anesthesia Plan: General   Post-op Pain Management:    Induction: Intravenous  PONV Risk Score and Plan: Ondansetron, Dexamethasone, Midazolam and Treatment may vary due to age or medical condition  Airway Management Planned: Oral ETT  Additional Equipment: None  Intra-op Plan:   Post-operative Plan: Extubation in OR  Informed Consent: I have reviewed the patients History and Physical, chart, labs and discussed the procedure including the risks, benefits and alternatives for the proposed anesthesia with the patient or authorized representative who has indicated his/her understanding and acceptance.     Dental advisory given  Plan Discussed with:  CRNA  Anesthesia Plan Comments: (Last airway note: Ventilation: Mask ventilation without difficulty Laryngoscope Size: Mac and 4 Grade View: Grade I Tube type: Oral Tube size: 7.0 mm Number of attempts: 1 )        Anesthesia Quick Evaluation

## 2023-12-07 ENCOUNTER — Other Ambulatory Visit: Payer: Medicare Other

## 2023-12-07 ENCOUNTER — Encounter (HOSPITAL_COMMUNITY): Payer: Self-pay | Admitting: Internal Medicine

## 2023-12-07 DIAGNOSIS — K8020 Calculus of gallbladder without cholecystitis without obstruction: Secondary | ICD-10-CM | POA: Diagnosis not present

## 2023-12-07 DIAGNOSIS — K8050 Calculus of bile duct without cholangitis or cholecystitis without obstruction: Secondary | ICD-10-CM | POA: Diagnosis not present

## 2023-12-07 DIAGNOSIS — R7989 Other specified abnormal findings of blood chemistry: Secondary | ICD-10-CM | POA: Diagnosis not present

## 2023-12-07 LAB — CEA: CEA: 1.8 ng/mL (ref 0.0–4.7)

## 2023-12-07 LAB — COMPREHENSIVE METABOLIC PANEL
ALT: 154 U/L — ABNORMAL HIGH (ref 0–44)
AST: 91 U/L — ABNORMAL HIGH (ref 15–41)
Albumin: 2.7 g/dL — ABNORMAL LOW (ref 3.5–5.0)
Alkaline Phosphatase: 234 U/L — ABNORMAL HIGH (ref 38–126)
Anion gap: 9 (ref 5–15)
BUN: 20 mg/dL (ref 8–23)
CO2: 25 mmol/L (ref 22–32)
Calcium: 8.5 mg/dL — ABNORMAL LOW (ref 8.9–10.3)
Chloride: 104 mmol/L (ref 98–111)
Creatinine, Ser: 1.42 mg/dL — ABNORMAL HIGH (ref 0.61–1.24)
GFR, Estimated: 48 mL/min — ABNORMAL LOW (ref >60)
Glucose, Bld: 105 mg/dL — ABNORMAL HIGH (ref 70–99)
Potassium: 4.6 mmol/L (ref 3.5–5.1)
Sodium: 138 mmol/L (ref 135–145)
Total Bilirubin: 6.8 mg/dL — ABNORMAL HIGH (ref 0.0–1.2)
Total Protein: 5.6 g/dL — ABNORMAL LOW (ref 6.5–8.1)

## 2023-12-07 LAB — CBC
HCT: 35 % — ABNORMAL LOW (ref 39.0–52.0)
Hemoglobin: 11.9 g/dL — ABNORMAL LOW (ref 13.0–17.0)
MCH: 36.4 pg — ABNORMAL HIGH (ref 26.0–34.0)
MCHC: 34 g/dL (ref 30.0–36.0)
MCV: 107 fL — ABNORMAL HIGH (ref 80.0–100.0)
Platelets: 136 10*3/uL — ABNORMAL LOW (ref 150–400)
RBC: 3.27 MIL/uL — ABNORMAL LOW (ref 4.22–5.81)
RDW: 14.2 % (ref 11.5–15.5)
WBC: 9.1 10*3/uL (ref 4.0–10.5)
nRBC: 0 % (ref 0.0–0.2)

## 2023-12-07 MED ORDER — SODIUM CHLORIDE 0.9 % IV BOLUS
500.0000 mL | Freq: Once | INTRAVENOUS | Status: AC
Start: 2023-12-07 — End: 2023-12-07
  Administered 2023-12-07: 500 mL via INTRAVENOUS

## 2023-12-07 MED ORDER — METRONIDAZOLE 500 MG/100ML IV SOLN
500.0000 mg | Freq: Two times a day (BID) | INTRAVENOUS | Status: DC
Start: 2023-12-07 — End: 2023-12-12
  Administered 2023-12-07 – 2023-12-12 (×10): 500 mg via INTRAVENOUS
  Filled 2023-12-07 (×10): qty 100

## 2023-12-07 MED ORDER — SODIUM CHLORIDE 0.9 % IV SOLN
2.0000 g | Freq: Two times a day (BID) | INTRAVENOUS | Status: DC
  Administered 2023-12-07 – 2023-12-11 (×10): 2 g via INTRAVENOUS
  Filled 2023-12-07 (×10): qty 12.5

## 2023-12-07 NOTE — Consult Note (Signed)
 Consult Note  Chad Fisher 15-Apr-1933  [id number].    Requesting MD: Payton Mccallum, MD Chief Complaint/Reason for Consult: choledocholithiasis  HPI:  Patient is an 89 year old male who presented to the ED with abdominal pain after eating and yellow skin and eyes. Elevated LFTs, found to have choledocholithiasis and underwent ERCP with sphincterotomy yesterday. He currently denies pain, nausea or vomiting. Wife is at bedside and reports he still appears yellow to her and his urine remains dark. PMH otherwise significant for recent cecal adenocarcinoma s/p open right hemicolectomy in June of last year by Dr. Bedelia Person, HLD, hypothyroidism, CKD stage III, Hx of pernicious anemia. He completed neoadjuvant chemotherapy for colon cancer 10/12/23. Other prior abdominal surgery includes previous right inguinal hernia repair. He is not on any blood thinners and NKDA. He lives in a retirement community with his wife but is independent for ADLs.   ROS: Negative other than HPI  Family History  Problem Relation Age of Onset   Anemia Father    Benign prostatic hyperplasia Brother    Cancer Daughter    Breast cancer Daughter    Kidney cancer Son        doing ok    Past Medical History:  Diagnosis Date   Cancer [facility])    skin   Hernia 1987   right inguinal     Past Surgical History:  Procedure Laterality Date   BIOPSY  04/06/2023   Procedure: BIOPSY;  Surgeon: Hilarie Fredrickson, MD;  Location: [facility];  Service: Gastroenterology;;   CATARACT EXTRACTION, BILATERAL     COLONOSCOPY N/A 04/06/2023   Procedure: COLONOSCOPY;  Surgeon: Hilarie Fredrickson, MD;  Location: [facility];  Service: Gastroenterology;  Laterality: N/A;   HERNIA REPAIR     LAPAROTOMY N/A 04/08/2023   Procedure: EXPLORATORY LAPAROTOMY WITH RIGHT HEMICOLECTOMY;  Surgeon: Diamantina Monks, MD;  Location: [facility];  Service: General;  Laterality: N/A;   MOHS SURGERY     left ear   POLYPECTOMY  04/06/2023   Procedure:  POLYPECTOMY;  Surgeon: Hilarie Fredrickson, MD;  Location: [facility];  Service: Gastroenterology;;   SUBMUCOSAL TATTOO INJECTION  04/06/2023   Procedure: SUBMUCOSAL TATTOO INJECTION;  Surgeon: Hilarie Fredrickson, MD;  Location: [facility];  Service: Gastroenterology;;    Social History:  reports that he has never smoked. He has never used smokeless tobacco. He reports that he does not drink alcohol and does not use drugs.  Allergies: No Known Allergies  Medications Prior to Admission  Medication Sig Dispense Refill   cyanocobalamin (VITAMIN B12) 1000 MCG/ML injection INJECT 1mL INTRAMUSCULARLY EVERY 30 DAYS (Patient taking differently: Inject 1,000 mcg into the muscle every 30 (thirty) days.) 10 mL 3   levothyroxine (SYNTHROID) 75 MCG tablet TAKE 1 TABLET BY MOUTH DAILY 100 tablet 2   Potassium Citrate 15 MEQ (1620 MG) TBCR Take 1 tablet by mouth daily.     simvastatin (ZOCOR) 20 MG tablet TAKE 1 TABLET BY MOUTH DAILY 100 tablet 2   nitrofurantoin, macrocrystal-monohydrate, (MACROBID) 100 MG capsule Take 1 capsule (100 mg total) by mouth 2 (two) times daily. (Patient not taking: Reported on 12/05/2023) 14 capsule 0    Blood pressure (!) 107/59, pulse (!) 52, temperature 97.6 F (36.4 C), temperature source Oral, resp. rate 14, height 5\' 8"  (1.727 m), weight 65.3 kg, SpO2 97%. Physical Exam:  General: pleasant, WD, elderly male who is laying in bed in NAD HEENT: sclera icteric Heart: regular, rate, and  rhythm.  Lungs: CTAB, no wheezes, rhonchi, or rales noted.  Respiratory effort nonlabored Abd: soft, NT, ND, midline surgical scar well healed  MS: all 4 extremities are symmetrical with no cyanosis, clubbing, or edema. Skin: warm and dry, jaundiced Neuro: Cranial nerves 2-12 grossly intact, sensation is normal throughout Psych: A&Ox3 with an appropriate affect.   Results for orders placed or performed during the hospital encounter of 12/05/23 (from the past 48 hours)  CBC     Status: Abnormal    Collection Time: 12/06/23  4:09 AM  Result Value Ref Range   WBC 12.9 (H) 4.0 - 10.5 K/uL   RBC 3.19 (L) 4.22 - 5.81 MIL/uL   Hemoglobin 11.7 (L) 13.0 - 17.0 g/dL   HCT 40.9 (L) 81.1 - 91.4 %   MCV 110.0 (H) 80.0 - 100.0 fL   MCH 36.7 (H) 26.0 - 34.0 pg   MCHC 33.3 30.0 - 36.0 g/dL   RDW 78.2 95.6 - 21.3 %   Platelets 152 150 - 400 K/uL   nRBC 0.0 0.0 - 0.2 %    Comment: Performed at [facility], [address] [place], [place] [address]  Comprehensive metabolic panel     Status: Abnormal   Collection Time: 12/06/23  4:09 AM  Result Value Ref Range   Sodium 137 135 - 145 mmol/L   Potassium 4.7 3.5 - 5.1 mmol/L   Chloride 104 98 - 111 mmol/L   CO2 26 22 - 32 mmol/L   Glucose, Bld 92 70 - 99 mg/dL    Comment: Glucose reference range applies only to samples taken after fasting for at least 8 hours.   BUN 20 8 - 23 mg/dL   Creatinine, Ser 8.46 0.61 - 1.24 mg/dL   Calcium 8.3 (L) 8.9 - 10.3 mg/dL   Total Protein 5.5 (L) 6.5 - 8.1 g/dL   Albumin 2.7 (L) 3.5 - 5.0 g/dL   AST 962 (H) 15 - 41 U/L   ALT 215 (H) 0 - 44 U/L   Alkaline Phosphatase 178 (H) 38 - 126 U/L   Total Bilirubin 7.3 (H) 0.0 - 1.2 mg/dL    Comment: DELTA CHECK NOTED   GFR, Estimated 56 (L) >60 mL/min    Comment: (NOTE) Calculated using the CKD-EPI Creatinine Equation (2021)    Anion gap 7 5 - 15    Comment: Performed at [facility], [address], [place], [place] [address]  Lipase, blood     Status: None   Collection Time: 12/06/23  4:09 AM  Result Value Ref Range   Lipase 29 11 - 51 U/L    Comment: Performed at [facility], [address] [place], [place] [address]  Comprehensive metabolic panel     Status: Abnormal   Collection Time: 12/07/23  4:21 AM  Result Value Ref Range   Sodium 138 135 - 145 mmol/L   Potassium 4.6 3.5 - 5.1 mmol/L   Chloride 104 98 - 111 mmol/L   CO2 25 22 - 32 mmol/L   Glucose, Bld 105 (H) 70 - 99 mg/dL    Comment:  Glucose reference range applies only to samples taken after fasting for at least 8 hours.   BUN 20 8 - 23 mg/dL   Creatinine, Ser 0.10 (H) 0.61 - 1.24 mg/dL   Calcium 8.5 (L) 8.9 - 10.3 mg/dL   Total Protein 5.6 (L) 6.5 - 8.1 g/dL   Albumin 2.7 (L) 3.5 - 5.0 g/dL  AST 91 (H) 15 - 41 U/L   ALT 154 (H) 0 - 44 U/L   Alkaline Phosphatase 234 (H) 38 - 126 U/L   Total Bilirubin 6.8 (H) 0.0 - 1.2 mg/dL   GFR, Estimated 48 (L) >60 mL/min    Comment: (NOTE) Calculated using the CKD-EPI Creatinine Equation (2021)    Anion gap 9 5 - 15    Comment: Performed at [facility], [address], [place], [place] [address]  CBC     Status: Abnormal   Collection Time: 12/07/23  4:21 AM  Result Value Ref Range   WBC 9.1 4.0 - 10.5 K/uL   RBC 3.27 (L) 4.22 - 5.81 MIL/uL   Hemoglobin 11.9 (L) 13.0 - 17.0 g/dL   HCT 19.1 (L) 47.8 - 29.5 %   MCV 107.0 (H) 80.0 - 100.0 fL   MCH 36.4 (H) 26.0 - 34.0 pg   MCHC 34.0 30.0 - 36.0 g/dL   RDW 62.1 30.8 - 65.7 %   Platelets 136 (L) 150 - 400 K/uL   nRBC 0.0 0.0 - 0.2 %    Comment: Performed at [facility], [address], [place], [place] [address]   DG C-Arm 1-60 Min-No Report Result Date: 12/06/2023 Fluoroscopy was utilized by the requesting physician.  No radiographic interpretation.      Assessment/Plan Choledocholithiasis ?Cholecystitis  - [place] 2/23 without gallstones - CT 2/23 with chronic small inguinal hernias, no obstruction, no hepatobiliary pathology noted - MRCP 2/23 with small distal CBD stone and question of small stone in the neck of the gallbladder - s/p ERCP with biliary sphincterotomy 2/24 - Tbili 6.8 from 7.3, Alk Phos up slightly at 234 from 178, AST/ALT trending down - WBC 9 from 12.9, afebrile and HD stable  - will discuss with MD but likely OR tomorrow for laparoscopic cholecystectomy. Patient agreeable  - I have explained the procedure, risks, and aftercare of Laparoscopic cholecystectomy.   Risks include but are not limited to anesthesia (MI, CVA, death, prolonged intubation and aspiration), bleeding, infection, wound problems, hernia, bile leak, injury to common bile duct/liver/intestine, possible need for subtotal cholecystectomy or open cholecystectomy, increased risk of DVT/PE and diarrhea post op.  He seems to understand and agrees to proceed.   FEN: CLD, IVF per [facility] VTE: ok to have SQH or LMWH from surgery standpoint ID: [email or web address]  - per [facility] -  recent cecal adenocarcinoma s/p open right hemicolectomy and neoadjuvant chemo HLD Hypothyroidism CKD stage III Hx of pernicious anemia  I reviewed Consultant GI notes, hospitalist notes, last 24 h vitals and pain scores, last 48 h intake and output, last 24 h labs and trends, and last 24 h imaging results.  This care required high  level of medical decision making.   Juliet Rude, [facility] 12/07/2023, 10:29 AM Please see Amion for pager number during day hours 7:00am-4:30pm

## 2023-12-07 NOTE — Progress Notes (Signed)
 PROGRESS NOTE  Chad Fisher  JJO:[id number] DOB: [birth year] DOA: 12/05/2023 PCP: Shelva Majestic, MD  Consultants  Brief Narrative: 89 y.o. male with a PMH significant for skin cancer, right inguinal hernia, cataract, hypothyroidism, hyperlipidemia CKD, senile purpura, hematochezia, colon cancer, anemia, urolithiasis who presented to the emergency department with complaints of abdominal pain associated with chills, nausea and emesis that woke him up from sleep.  No fever.  He has been having on and off abdominal pain with workup showing abnormal LFTs with hyperbilirubinemia for over a month.  His symptoms and labs were improving until he started having these episodic pain.  Came to ER and MRCP showed 2 mm stone in distal and common bile duct.  With elevated temp and white count.  Admitted to [facility] and GI consulted.  Status post ERCP and stone retrieval/sphincterotomy 2/24.  Likely for lap chole tomorrow 2/26.   Assessment & Plan:   Choledocholithiasis Without clear evidence of cholecystitis although white count was elevated and borderline lipase and chills. -Status post ERCP 2/24 with biliary sphincterotomy.  Appreciate GI input -Surgery consulted for possible lap chole in house.  Possibly as early as tomorrow. -.  LFTs downtrending but still elevated.  Bilirubin elevated but also downtrending.  Will trend.  Leukocytosis resolved. -Overall he feels much better today.  Reports he is ready for surgery.  AKI: -Slight elevation in creatinine from baseline of what seems to be 1.1.  Was 1.42 today. Likely prerenal as he had some time yesterday with out IV fluids and while NPO.  IV fluids were started.  Patient now on liquid diet. -Regardless we have switched his Zosyn to cefepime/metronidazole to ensure no further renal issues. -Will trend creatinine.     Hyperlipidemia Hold statin due to abnormal LFTs.     Hypothyroidism Continue levothyroxine 75 mcg p.o. daily.      Macrocytosis Anemia panel showed iron deficiency.  History of pernicious anemia. Normal or higher than normal B12 level. Normal folic acid level. -Following in light of possible surgery.  Leukocytosis: -Trend.  Resolved.         DVT prophylaxis:  SCDs Start: 12/05/23 1601  Code Status:   Code Status: Full Code Level of care: Med-Surg Status is: Inpatient   Consults called: Gastroenterology  Subjective: Feels very well today and wants food.  Bored with being in the hospital.  But does want to have surgery this admission if possible.  No complaints or concerns.  Feels well status post procedure yesterday.  Objective: Vitals:   12/06/23 2037 12/07/23 0139 12/07/23 0503 12/07/23 1234  BP: 119/75 131/67 (!) 107/59 (!) 86/56  Pulse: (!) 57 61 (!) 52 (!) 56  Resp: 16  14 16   Temp: 98.3 F (36.8 C) 97.8 F (36.6 C) 97.6 F (36.4 C) 98 F (36.7 C)  TempSrc: Oral Oral Oral Oral  SpO2: 100% 97% 97% 99%  Weight:      Height:        Intake/Output Summary (Last 24 hours) at 12/07/2023 1626 Last data filed at 12/07/2023 0805 Gross per 24 hour  Intake 1200 ml  Output 1175 ml  Net 25 ml   Filed Weights   12/05/23 0324  Weight: 65.3 kg   Body mass index is 21.9 kg/m.  Gen: 89 y.o. male in no apparent distress.  Nontoxic.  Somewhat sallow appearance of skin Pulm: Non-labored breathing.  Clear to auscultation bilaterally.  CV: Regular rate and rhythm. No murmur, rub, or gallop. No JVD GI: Abdomen  soft, nondistended and not really tender on my examination today.  Good bowel sounds throughout. Ext: Warm, no deformities, no pedal edema Skin: No rashes, lesions no ulcers Neuro: Alert and oriented. No focal neurological deficits. Psych: Calm  Judgement and insight appear normal. Mood & affect appropriate.     I have personally reviewed the following labs and images: CBC: Recent Labs  Lab 12/05/23 0355 12/06/23 0409 12/07/23 0421  WBC 12.2* 12.9* 9.1  NEUTROABS 11.5*  --    --   HGB 13.9 11.7* 11.9*  HCT 41.8 35.1* 35.0*  MCV 107.7* 110.0* 107.0*  PLT 213 152 136*   BMP &GFR Recent Labs  Lab 12/05/23 0355 12/06/23 0409 12/07/23 0421  NA 137 137 138  K 4.5 4.7 4.6  CL 102 104 104  CO2 25 26 25   GLUCOSE 144* 92 105*  BUN 19 20 20   CREATININE 0.97 1.24 1.42*  CALCIUM 8.5* 8.3* 8.5*   Estimated Creatinine Clearance: 33.2 mL/min (A) (by C-G formula based on SCr of 1.42 mg/dL (H)). Liver & Pancreas: Recent Labs  Lab 12/05/23 0355 12/06/23 0409 12/07/23 0421  AST 353* 164* 91*  ALT 213* 215* 154*  ALKPHOS 221* 178* 234*  BILITOT 2.8* 7.3* 6.8*  PROT 6.5 5.5* 5.6*  ALBUMIN 3.4* 2.7* 2.7*   Recent Labs  Lab 12/05/23 0355 12/06/23 0409  LIPASE 56* 29   No results for input(s): "AMMONIA" in the last 168 hours. Diabetic: No results for input(s): "HGBA1C" in the last 72 hours. No results for input(s): "GLUCAP" in the last 168 hours. Cardiac Enzymes: No results for input(s): "CKTOTAL", "CKMB", "CKMBINDEX", "TROPONINI" in the last 168 hours. No results for input(s): "PROBNP" in the last 8760 hours. Coagulation Profile: No results for input(s): "INR", "PROTIME" in the last 168 hours.  Thyroid Function Tests: No results for input(s): "TSH", "T4TOTAL", "FREET4", "T3FREE", "THYROIDAB" in the last 72 hours. Lipid Profile: No results for input(s): "CHOL", "HDL", "LDLCALC", "TRIG", "CHOLHDL", "LDLDIRECT" in the last 72 hours. Anemia Panel: No results for input(s): "VITAMINB12", "FOLATE", "FERRITIN", "TIBC", "IRON", "RETICCTPCT" in the last 72 hours. Urine analysis:    Component Value Date/Time   COLORURINE YELLOW 12/05/2023 0335   APPEARANCEUR CLEAR 12/05/2023 0335   LABSPEC 1.017 12/05/2023 0335   PHURINE 6.0 12/05/2023 0335   GLUCOSEU NEGATIVE 12/05/2023 0335   GLUCOSEU >=1000 (A) 11/25/2023 1027   HGBUR SMALL (A) 12/05/2023 0335   HGBUR negative 09/24/2010 1004   BILIRUBINUR NEGATIVE 12/05/2023 0335   BILIRUBINUR positive 11/25/2023  0936   KETONESUR NEGATIVE 12/05/2023 0335   PROTEINUR NEGATIVE 12/05/2023 0335   UROBILINOGEN 0.2 11/25/2023 1027   UROBILINOGEN 2.0 (A) 11/25/2023 0936   NITRITE NEGATIVE 12/05/2023 0335   LEUKOCYTESUR NEGATIVE 12/05/2023 0335   LEUKOCYTESUR Negative 01/24/2018 0000   Sepsis Labs: Invalid input(s): "PROCALCITONIN", "LACTICIDVEN"  Microbiology: Recent Results (from the past 240 hours)  Resp panel by RT-PCR (RSV, Flu A&B, Covid) Anterior Nasal Swab     Status: None   Collection Time: 12/05/23  3:36 AM   Specimen: Anterior Nasal Swab  Result Value Ref Range Status   SARS Coronavirus 2 by RT PCR NEGATIVE NEGATIVE Final    Comment: (NOTE) SARS-CoV-2 target nucleic acids are NOT DETECTED.  The SARS-CoV-2 RNA is generally detectable in upper respiratory specimens during the acute phase of infection. The lowest concentration of SARS-CoV-2 viral copies this assay can detect is 138 copies/mL. A negative result does not preclude SARS-Cov-2 infection and should not be used as the sole basis for  treatment or other patient management decisions. A negative result may occur with  improper specimen collection/handling, submission of specimen other than nasopharyngeal swab, presence of viral mutation(s) within the areas targeted by this assay, and inadequate number of viral copies(<138 copies/mL). A negative result must be combined with clinical observations, patient history, and epidemiological information. The expected result is Negative.  Fact Sheet for Patients:  BloggerCourse.com  Fact Sheet for Healthcare Providers:  SeriousBroker.it  This test is no t yet approved or cleared by the [place] FDA and  has been authorized for detection and/or diagnosis of SARS-CoV-2 by FDA under an Emergency Use Authorization (EUA). This EUA will remain  in effect (meaning this test can be used) for the duration of the COVID-19 declaration under  Section 564(b)(1) of the Act, 21 U.S.C.section 360bbb-3(b)(1), unless the authorization is terminated  or revoked sooner.       Influenza A by PCR NEGATIVE NEGATIVE Final   Influenza B by PCR NEGATIVE NEGATIVE Final    Comment: (NOTE) The Xpert Xpress SARS-CoV-2/FLU/RSV plus assay is intended as an aid in the diagnosis of influenza from Nasopharyngeal swab specimens and should not be used as a sole basis for treatment. Nasal washings and aspirates are unacceptable for Xpert Xpress SARS-CoV-2/FLU/RSV testing.  Fact Sheet for Patients: BloggerCourse.com  Fact Sheet for Healthcare Providers: SeriousBroker.it  This test is not yet approved or cleared by the [place] FDA and has been authorized for detection and/or diagnosis of SARS-CoV-2 by FDA under an Emergency Use Authorization (EUA). This EUA will remain in effect (meaning this test can be used) for the duration of the COVID-19 declaration under Section 564(b)(1) of the Act, 21 U.S.C. section 360bbb-3(b)(1), unless the authorization is terminated or revoked.     Resp Syncytial Virus by PCR NEGATIVE NEGATIVE Final    Comment: (NOTE) Fact Sheet for Patients: BloggerCourse.com  Fact Sheet for Healthcare Providers: SeriousBroker.it  This test is not yet approved or cleared by the [place] FDA and has been authorized for detection and/or diagnosis of SARS-CoV-2 by FDA under an Emergency Use Authorization (EUA). This EUA will remain in effect (meaning this test can be used) for the duration of the COVID-19 declaration under Section 564(b)(1) of the Act, 21 U.S.C. section 360bbb-3(b)(1), unless the authorization is terminated or revoked.  Performed at [facility], [address], [place], [place] [address]     Radiology Studies: No results found.   Scheduled Meds: Continuous Infusions:   ceFEPime (MAXIPIME) IV 2 g (12/07/23 1057)   metronidazole 500 mg (12/07/23 1142)     LOS: 1 day   35 minutes with more than 50% spent in reviewing records, counseling patient/family and coordinating care.  Tobey Grim, MD [facility] [email or web address] 12/07/2023, 4:26 PM

## 2023-12-07 NOTE — Plan of Care (Signed)
  Problem: Safety: Goal: Ability to remain free from injury will improve Outcome: Progressing   Problem: Nutrition: Goal: Adequate nutrition will be maintained Outcome: Progressing   

## 2023-12-07 NOTE — Plan of Care (Signed)

## 2023-12-07 NOTE — Progress Notes (Addendum)
 Daily Progress Note  DOA: 12/05/2023 Hospital Day: 3   Chief Complaint:  bile duct stones  ASSESSMENT    Brief Narrative:  Chad Fisher is a 89 y.o. 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 Admitted 2/23 with abdominal pain, abnormal LFTs. Found to have CBD stones  Cholelithiasis / choledocholithiasis  S/p ERCP with sphincterotomy and stone extraction 12/06/23. Today :  Some improvement in bilirubin overnight. He feels okay. No abdominal pain   History of pernicious anemia.  Stable. Hgb 11.9, MCV 107. .   Stage IIIb Cecal AdenoCa s/p R hemicolectomy 03/2023 followed by neoadjuvant chemo.    PLAN   --General Surgery evaluating for cholecystectomy, sounds like it may be tomorrow --Repeat colon 03/2024 as outpt. CEA pending  Subjective   No complaints. No abdominal pain. In good spirits   Objective   ERCP 12/06/23 1. Choledocholithiasis status post ERCP with sphincterotomy and stone extraction 2. Cholelithiasis.  Recent Labs    12/05/23 0355 12/06/23 0409 12/07/23 0421  WBC 12.2* 12.9* 9.1  HGB 13.9 11.7* 11.9*  HCT 41.8 35.1* 35.0*  PLT 213 152 136*   BMET Recent Labs    12/05/23 0355 12/06/23 0409 12/07/23 0421  NA 137 137 138  K 4.5 4.7 4.6  CL 102 104 104  CO2 25 26 25   GLUCOSE 144* 92 105*  BUN 19 20 20   CREATININE 0.97 1.24 1.42*  CALCIUM 8.5* 8.3* 8.5*   LFT Recent Labs    12/05/23 0355 12/06/23 0409 12/07/23 0421  PROT 6.5   < > 5.6*  ALBUMIN 3.4*   < > 2.7*  AST 353*   < > 91*  ALT 213*   < > 154*  ALKPHOS 221*   < > 234*  BILITOT 2.8*   < > 6.8*  BILIDIR 1.5*  --   --    < > = values in this interval not displayed.   PT/INR No results for input(s): "LABPROT", "INR" in the last 72 hours.   Imaging:  DG C-Arm 1-60 Min-No Report Fluoroscopy was  utilized by the requesting physician.  No radiographic  interpretation.  MR 3D Recon At Scanner CLINICAL DATA:  Generalized abdominal pain.  Nausea and vomiting.  EXAM: MRI ABDOMEN WITHOUT AND WITH CONTRAST (INCLUDING MRCP)  TECHNIQUE: Multiplanar multisequence MR imaging of the abdomen was performed both before and after the administration of intravenous contrast. Heavily T2-weighted images of the biliary and pancreatic ducts were obtained, and three-dimensional MRCP images were rendered by post processing.  CONTRAST:  6mL GADAVIST GADOBUTROL 1 MMOL/ML IV SOLN  COMPARISON:  Abdomen and pelvis CT earlier same day. Ultrasound exam earlier same day. Chest abdomen pelvis CT 04/06/2023.  FINDINGS: Lower chest: Subsegmental atelectasis noted left lung base.  Hepatobiliary: Diffuse loss of signal intensity in the liver parenchyma on out of phase T1 imaging is compatible with fatty deposition. No suspicious focal abnormality within the liver parenchyma. Gallbladder wall thickness upper normal at 3 mm with increased signal in the wall the gallbladder suggesting edema. MRCP imaging is markedly motion degraded. Within this limitation, tiny stone identified in the neck of the gallbladder (coronal MRCP image 23 of series 10 and axial T2 image 22 of series 4). Tiny 2 mm stone identified in the distal common bile duct (coronal MRCP image 23/10 and axial T2 haste image 24/26  and axial T2 image 25/4). No substantial intrahepatic biliary duct dilatation. Common bile duct measures 6 mm in the head of the pancreas.  Pancreas: No focal mass lesion. No dilatation of the main duct. No intraparenchymal cyst. No peripancreatic edema.  Spleen:  No splenomegaly. No suspicious focal mass lesion.  Adrenals/Urinary Tract: No adrenal nodule or mass. Bilateral simple renal cysts noted measuring up to 6.8 cm in the right kidney and 5.3 cm in the left kidney. Additional tiny T2 hyperintensities in  both kidneys show no definite enhancement but are technically too small to characterize. Likely benign. No followup imaging is recommended.  Stomach/Bowel: Stomach is moderately distended with food. Duodenum is normally positioned as is the ligament of Treitz. No small bowel or colonic dilatation within the visualized abdomen.  Vascular/Lymphatic: No abdominal aortic aneurysm. No abdominal lymphadenopathy  Other:  No intraperitoneal free fluid.  Musculoskeletal: No focal suspicious marrow enhancement within the visualized bony anatomy.  IMPRESSION: 1. Tiny 2 mm stone in the distal common bile duct. No substantial intrahepatic biliary duct dilatation. Common bile duct measures 6 mm in the head of the pancreas. 2. Tiny stone in the neck of the gallbladder. Gallbladder wall thickness upper normal at 3 mm with increased signal in the wall the gallbladder suggesting edema. Imaging features raise concern for acute cholecystitis. 3. Hepatic steatosis. 4. Bilateral simple renal cysts.  Electronically Signed   By: Kennith Center M.D.   On: 12/06/2023 08:16     Scheduled inpatient medications:  Continuous inpatient infusions:   piperacillin-tazobactam (ZOSYN)  IV 3.375 g (12/07/23 0513)   PRN inpatient medications: acetaminophen **OR** acetaminophen, ondansetron **OR** ondansetron (ZOFRAN) IV, mouth rinse  Vital signs in last 24 hours: Temp:  [97.5 F (36.4 C)-98.3 F (36.8 C)] 97.6 F (36.4 C) (02/25 0503) Pulse Rate:  [52-64] 52 (02/25 0503) Resp:  [14-25] 14 (02/25 0503) BP: (102-159)/(44-75) 107/59 (02/25 0503) SpO2:  [94 %-100 %] 97 % (02/25 0503) Last BM Date : 12/05/23  Intake/Output Summary (Last 24 hours) at 12/07/2023 1011 Last data filed at 12/07/2023 0805 Gross per 24 hour  Intake 1900 ml  Output 1175 ml  Net 725 ml    Intake/Output from previous day: 02/24 0701 - 02/25 0700 In: 1663 [P.O.:960; I.V.:703] Out: 1400 [Urine:1400] Intake/Output this  shift: Total I/O In: 240 [P.O.:240] Out: 125 [Urine:125]   Physical Exam:  General: Alert male in NAD Heart:  Regular rate and rhythm.  Pulmonary: Normal respiratory effort Abdomen: Soft, nondistended, nontender. Normal bowel sounds. Extremities: No lower extremity edema  Neurologic: Alert and oriented Psych: Pleasant. Cooperative. Insight appears normal.      LOS: 1 day   Willette Cluster ,NP 12/07/2023, 10:11 AM  GI ATTENDING  Interval history data reviewed.  Agree with interval progress note as outlined above.  Patient doing well status post ERCP with sphincterotomy and common duct stone extraction.  Anticipating laparoscopic cholecystectomy.  Wilhemina Bonito. Eda Keys., M.D. [facility] Division of Gastroenterology

## 2023-12-08 DIAGNOSIS — N1832 Chronic kidney disease, stage 3b: Secondary | ICD-10-CM

## 2023-12-08 DIAGNOSIS — R7989 Other specified abnormal findings of blood chemistry: Secondary | ICD-10-CM | POA: Diagnosis not present

## 2023-12-08 DIAGNOSIS — K8020 Calculus of gallbladder without cholecystitis without obstruction: Secondary | ICD-10-CM | POA: Diagnosis not present

## 2023-12-08 DIAGNOSIS — K8050 Calculus of bile duct without cholangitis or cholecystitis without obstruction: Secondary | ICD-10-CM | POA: Diagnosis not present

## 2023-12-08 DIAGNOSIS — D7589 Other specified diseases of blood and blood-forming organs: Secondary | ICD-10-CM

## 2023-12-08 DIAGNOSIS — E039 Hypothyroidism, unspecified: Secondary | ICD-10-CM

## 2023-12-08 DIAGNOSIS — E782 Mixed hyperlipidemia: Secondary | ICD-10-CM

## 2023-12-08 LAB — COMPREHENSIVE METABOLIC PANEL
ALT: 146 U/L — ABNORMAL HIGH (ref 0–44)
AST: 107 U/L — ABNORMAL HIGH (ref 15–41)
Albumin: 2.5 g/dL — ABNORMAL LOW (ref 3.5–5.0)
Alkaline Phosphatase: 327 U/L — ABNORMAL HIGH (ref 38–126)
Anion gap: 7 (ref 5–15)
BUN: 14 mg/dL (ref 8–23)
CO2: 25 mmol/L (ref 22–32)
Calcium: 8.2 mg/dL — ABNORMAL LOW (ref 8.9–10.3)
Chloride: 105 mmol/L (ref 98–111)
Creatinine, Ser: 1.28 mg/dL — ABNORMAL HIGH (ref 0.61–1.24)
GFR, Estimated: 54 mL/min — ABNORMAL LOW (ref >60)
Glucose, Bld: 95 mg/dL (ref 70–99)
Potassium: 4.2 mmol/L (ref 3.5–5.1)
Sodium: 137 mmol/L (ref 135–145)
Total Bilirubin: 8.1 mg/dL — ABNORMAL HIGH (ref 0.0–1.2)
Total Protein: 5.5 g/dL — ABNORMAL LOW (ref 6.5–8.1)

## 2023-12-08 LAB — CBC
HCT: 36.6 % — ABNORMAL LOW (ref 39.0–52.0)
Hemoglobin: 12 g/dL — ABNORMAL LOW (ref 13.0–17.0)
MCH: 35.3 pg — ABNORMAL HIGH (ref 26.0–34.0)
MCHC: 32.8 g/dL (ref 30.0–36.0)
MCV: 107.6 fL — ABNORMAL HIGH (ref 80.0–100.0)
Platelets: 144 10*3/uL — ABNORMAL LOW (ref 150–400)
RBC: 3.4 MIL/uL — ABNORMAL LOW (ref 4.22–5.81)
RDW: 14.4 % (ref 11.5–15.5)
WBC: 6.9 10*3/uL (ref 4.0–10.5)
nRBC: 0 % (ref 0.0–0.2)

## 2023-12-08 NOTE — Plan of Care (Signed)

## 2023-12-08 NOTE — Progress Notes (Signed)
 Progress Note   Patient: Chad Fisher OZH:[id number] DOB: Jan 09, 1933 DOA: 12/05/2023     2 DOS: the patient was seen and examined on 12/08/2023   Brief hospital course: 89 y.o. male with a PMH significant for skin cancer, right inguinal hernia, cataract, hypothyroidism, hyperlipidemia CKD, senile purpura, hematochezia, colon cancer, anemia, urolithiasis who presented to the emergency department with complaints of abdominal pain associated with chills, nausea and emesis that woke him up from sleep. He has been having on and off abdominal pain with workup showing abnormal LFTs with hyperbilirubinemia for over a month.  His symptoms and labs were improving until he started having these episodic pain.  Came to ER and MRCP showed 2 mm stone in distal and common bile duct.  With elevated temp and white count.  Admitted to [facility] hospitalist and GI consulted.  Status post ERCP and stone retrieval/sphincterotomy 2/24.  Likely for lap chole tomorrow 2/26.   Assessment and Plan: Choledocholithiasis No evidence of cholecystitis Elevated LFTs, bili high today. S/p ERCP 224 with biliary sphincterotomy. GI follow-up appreciated. General surgery plan to take him to lap chole once LFTs trending down.  Acute kidney injury- Improving with IV fluids. Encourage oral fluids.  Avoid nephrotoxic drugs.  Hyperlipidemia Hold statins due to elevated LFTs.  Hypothyroidism Continue home dose levothyroxine.  History of pernicious anemia Hemoglobin stable, B12 normal.  Stage IIIb cecal adenocarcinoma status post right hemicolectomy, chemotherapy. Outpatient oncology follow-up advised.    Out of bed to chair. Incentive spirometry. Nursing supportive care. Fall, aspiration precautions. DVT prophylaxis   Code Status: Full Code  Subjective: Patient is seen and examined today morning. He wishes to eat regular diet, is aware of current diagnosis and treatment plan. No nausea or vomiting.  Physical  Exam: Vitals:   12/07/23 1234 12/07/23 2042 12/08/23 0418 12/08/23 1202  BP: (!) 86/56 111/74 137/70 116/71  Pulse: (!) 56 62 60 (!) 57  Resp: 16 18 16 16   Temp: 98 F (36.7 C) 98 F (36.7 C) 98 F (36.7 C) 98.6 F (37 C)  TempSrc: Oral Oral Oral   SpO2: 99% 98% 98% 97%  Weight:      Height:        General - Elderly thin built Caucasian male, no apparent distress HEENT - PERRLA, EOMI, icteric conjunctiva, atraumatic head, non tender sinuses. Lung - Clear, no rales, rhonchi, wheezes. Heart - S1, S2 heard, no murmurs, rubs, no pedal edema. Abdomen - Soft, non tender, bowel sounds good Neuro - Alert, awake and oriented x 3, non focal exam. Skin - icteric skin, warm and dry.  Data Reviewed:      Latest Ref Rng & Units 12/08/2023    4:44 AM 12/07/2023    4:21 AM 12/06/2023    4:09 AM  CBC  WBC 4.0 - 10.5 K/uL 6.9  9.1  12.9   Hemoglobin 13.0 - 17.0 g/dL 62.9  52.8  41.3   Hematocrit 39.0 - 52.0 % 36.6  35.0  35.1   Platelets 150 - 400 K/uL 144  136  152       Latest Ref Rng & Units 12/08/2023    4:44 AM 12/07/2023    4:21 AM 12/06/2023    4:09 AM  BMP  Glucose 70 - 99 mg/dL 95  244  92   BUN 8 - 23 mg/dL 14  20  20    Creatinine 0.61 - 1.24 mg/dL 0.10  2.72  5.36   Sodium 135 - 145 mmol/L 137  138  137   Potassium 3.5 - 5.1 mmol/L 4.2  4.6  4.7   Chloride 98 - 111 mmol/L 105  104  104   CO2 22 - 32 mmol/L 25  25  26    Calcium 8.9 - 10.3 mg/dL 8.2  8.5  8.3    No results found.  Family Communication: Discussed with patient, he understand and agree. All questions answereed.  Disposition: Status is: Inpatient Remains inpatient appropriate because: awaiting surgical intervention  Planned Discharge Destination: Home with Home Health     Time spent: 38 minutes  Author: Marcelino Duster, MD 12/08/2023 6:43 PM Secure chat 7am to 7pm For on call review [email or web address].

## 2023-12-08 NOTE — Progress Notes (Addendum)
 Daily Progress Note  DOA: 12/05/2023 Hospital Day: 4   Chief Complaint:  choledocholithiasis  ASSESSMENT    Brief Narrative:  Chad Fisher is a 89 y.o. 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 Admitted 2/23 with abdominal pain, abnormal LFTs. Found to have CBD stones    Cholelithiasis / choledocholithiasis  S/p ERCP with sphincterotomy and stone extraction 12/06/23. TODAY: Bili up some overnight from 6.8 >> 8.1 but not overly concerning at ths point.   History of pernicious anemia.  Stable. Hgb 11.9, MCV 107. .    Stage IIIb Cecal AdenoCa s/p R hemicolectomy 03/2023 followed by neoadjuvant chemo.    PLAN   --General Surgery following. Plan is for probable lap cholecystectomy in next day or two --Surgery ordered clear liquid diet ---Repeat colon 03/2024 as outpt. CEA is normal  Subjective   No abdominal pain. Feels okay    Objective   Recent Labs    12/06/23 0409 12/07/23 0421 12/08/23 0444  WBC 12.9* 9.1 6.9  HGB 11.7* 11.9* 12.0*  HCT 35.1* 35.0* 36.6*  PLT 152 136* 144*   BMET Recent Labs    12/06/23 0409 12/07/23 0421 12/08/23 0444  NA 137 138 137  K 4.7 4.6 4.2  CL 104 104 105  CO2 26 25 25   GLUCOSE 92 105* 95  BUN 20 20 14   CREATININE 1.24 1.42* 1.28*  CALCIUM 8.3* 8.5* 8.2*   LFT Recent Labs    12/08/23 0444  PROT 5.5*  ALBUMIN 2.5*  AST 107*  ALT 146*  ALKPHOS 327*  BILITOT 8.1*   PT/INR No results for input(s): "LABPROT", "INR" in the last 72 hours.   Imaging:  DG ERCP CLINICAL DATA:  Biliary colic.  EXAM: ERCP  TECHNIQUE: Multiple spot images obtained with the fluoroscopic device and submitted for interpretation post-procedure.  FLUOROSCOPY: Radiation Exposure Index (as provided by the fluoroscopic device): 167.5 mGy  Kerma  COMPARISON:  MRI 12/05/2023  FINDINGS: Common bile duct and main pancreatic duct were cannulated with wires. Retrograde cholangiogram demonstrates small filling defects in the bile ducts. Cystic duct is patent. Evidence for a balloon sweep. Probable contrast within the main pancreatic duct.  IMPRESSION: Small filling defects in the biliary system could represent small stones.  These images were submitted for radiologic interpretation only.  Electronically Signed   By: Richarda Overlie M.D.   On: 12/07/2023 11:04     Scheduled inpatient medications:  Continuous inpatient infusions:   ceFEPime (MAXIPIME) IV 2 g (12/07/23 2250)   metronidazole 500 mg (12/07/23 2328)   PRN inpatient medications: acetaminophen **OR** acetaminophen, ondansetron **OR** ondansetron (ZOFRAN) IV, mouth rinse  Vital signs in last 24 hours: Temp:  [98 F (36.7 C)] 98 F (36.7 C) (02/26 0418) Pulse Rate:  [56-62] 60 (02/26 0418) Resp:  [16-18] 16 (02/26 0418) BP: (86-137)/(56-74) 137/70 (02/26 0418) SpO2:  [98 %-99 %] 98 % (02/26 0418) Last BM Date : 12/05/23  Intake/Output Summary (Last 24 hours) at 12/08/2023 0934 Last data filed at 12/08/2023 0439 Gross per 24 hour  Intake 240 ml  Output --  Net 240 ml    Intake/Output from previous day: 02/25 0701 - 02/26 0700 In: 480 [P.O.:480] Out: 125 [Urine:125] Intake/Output this shift: No intake/output data recorded.   Physical Exam:  General: Alert male in NAD Heart:  Regular rate and rhythm.  Pulmonary: Normal respiratory effort Abdomen: Soft, nondistended, nontender. Normal bowel sounds. Extremities: No lower extremity edema  Neurologic: Alert and oriented Psych: Pleasant. Cooperative. Insight appears normal.      LOS: 2 days   Willette Cluster ,NP 12/08/2023, 9:34 AM  GI ATTENDING  Interval history data reviewed.  Agree with interval progress note.  He may have some edema at sphincterotomy site to account for his persistently  elevated liver test.  Laparoscopic cholecystectomy with IOC per general surgery.  Wilhemina Bonito. Eda Keys., M.D. [facility] Division of Gastroenterology

## 2023-12-08 NOTE — Plan of Care (Signed)
  Problem: Nutrition: Goal: Adequate nutrition will be maintained Outcome: Progressing   Problem: Safety: Goal: Ability to remain free from injury will improve Outcome: Progressing   

## 2023-12-08 NOTE — Progress Notes (Signed)
 2 Days Post-Op   Subjective/Chief Complaint: No complaints.    Objective: Vital signs in last 24 hours: Temp:  [98 F (36.7 C)] 98 F (36.7 C) (02/26 0418) Pulse Rate:  [56-62] 60 (02/26 0418) Resp:  [16-18] 16 (02/26 0418) BP: (86-137)/(56-74) 137/70 (02/26 0418) SpO2:  [98 %-99 %] 98 % (02/26 0418) Last BM Date : 12/05/23  Intake/Output from previous day: 02/25 0701 - 02/26 0700 In: 480 [P.O.:480] Out: 125 [Urine:125] Intake/Output this shift: No intake/output data recorded.  General appearance: alert and cooperative Resp: clear to auscultation bilaterally Cardio: regular rate and rhythm GI: soft, nontender  Lab Results:  Recent Labs    12/07/23 0421 12/08/23 0444  WBC 9.1 6.9  HGB 11.9* 12.0*  HCT 35.0* 36.6*  PLT 136* 144*   BMET Recent Labs    12/07/23 0421 12/08/23 0444  NA 138 137  K 4.6 4.2  CL 104 105  CO2 25 25  GLUCOSE 105* 95  BUN 20 14  CREATININE 1.42* 1.28*  CALCIUM 8.5* 8.2*   PT/INR No results for input(s): "LABPROT", "INR" in the last 72 hours. ABG No results for input(s): "PHART", "HCO3" in the last 72 hours.  Invalid input(s): "PCO2", "PO2"  Studies/Results: DG ERCP Result Date: 12/07/2023 CLINICAL DATA:  Biliary colic. EXAM: ERCP TECHNIQUE: Multiple spot images obtained with the fluoroscopic device and submitted for interpretation post-procedure. FLUOROSCOPY: Radiation Exposure Index (as provided by the fluoroscopic device): 167.5 mGy Kerma COMPARISON:  MRI 12/05/2023 FINDINGS: Common bile duct and main pancreatic duct were cannulated with wires. Retrograde cholangiogram demonstrates small filling defects in the bile ducts. Cystic duct is patent. Evidence for a balloon sweep. Probable contrast within the main pancreatic duct. IMPRESSION: Small filling defects in the biliary system could represent small stones. These images were submitted for radiologic interpretation only. Electronically Signed   By: Richarda Overlie M.D.   On: 12/07/2023  11:04   DG C-Arm 1-60 Min-No Report Result Date: 12/06/2023 Fluoroscopy was utilized by the requesting physician.  No radiographic interpretation.    Anti-infectives: Anti-infectives (From admission, onward)    Start     Dose/Rate Route Frequency Ordered Stop   12/07/23 1145  ceFEPIme (MAXIPIME) 2 g in sodium chloride 0.9 % 100 mL IVPB        2 g 200 mL/hr over 30 Minutes Intravenous Every 12 hours 12/07/23 1048     12/07/23 1145  metroNIDAZOLE (FLAGYL) IVPB 500 mg        500 mg 100 mL/hr over 60 Minutes Intravenous Every 12 hours 12/07/23 1048     12/05/23 1400  piperacillin-tazobactam (ZOSYN) IVPB 3.375 g  Status:  Discontinued        3.375 g 100 mL/hr over 30 Minutes Intravenous Every 8 hours 12/05/23 1026 12/05/23 1028   12/05/23 1200  piperacillin-tazobactam (ZOSYN) IVPB 3.375 g  Status:  Discontinued        3.375 g 12.5 mL/hr over 240 Minutes Intravenous Every 8 hours 12/05/23 1028 12/07/23 1048       Assessment/Plan: s/p Procedure(s): ENDOSCOPIC RETROGRADE CHOLANGIOPANCREATOGRAPHY (ERCP) (N/A) SPHINCTEROTOMY REMOVAL OF STONES Advance diet. Allow liquids today Bili up to 8 today. Will give him another day to allow this to start to normalize and will review imaging again Hopefully will be ready for lap chole soon  LOS: 2 days    Chad Fisher 12/08/2023

## 2023-12-09 ENCOUNTER — Inpatient Hospital Stay (HOSPITAL_COMMUNITY): Payer: Medicare Other

## 2023-12-09 ENCOUNTER — Encounter (HOSPITAL_COMMUNITY)
Admission: EM | Disposition: A | Discharge status: Home / Self Care | Payer: Self-pay | Source: Home / Self Care | Attending: Student

## 2023-12-09 DIAGNOSIS — K8020 Calculus of gallbladder without cholecystitis without obstruction: Secondary | ICD-10-CM | POA: Diagnosis not present

## 2023-12-09 DIAGNOSIS — D696 Thrombocytopenia, unspecified: Secondary | ICD-10-CM | POA: Diagnosis not present

## 2023-12-09 DIAGNOSIS — K8050 Calculus of bile duct without cholangitis or cholecystitis without obstruction: Secondary | ICD-10-CM | POA: Diagnosis not present

## 2023-12-09 DIAGNOSIS — R7989 Other specified abnormal findings of blood chemistry: Secondary | ICD-10-CM | POA: Diagnosis not present

## 2023-12-09 DIAGNOSIS — N1832 Chronic kidney disease, stage 3b: Secondary | ICD-10-CM | POA: Diagnosis not present

## 2023-12-09 LAB — COMPREHENSIVE METABOLIC PANEL
ALT: 150 U/L — ABNORMAL HIGH (ref 0–44)
AST: 116 U/L — ABNORMAL HIGH (ref 15–41)
Albumin: 2.8 g/dL — ABNORMAL LOW (ref 3.5–5.0)
Alkaline Phosphatase: 496 U/L — ABNORMAL HIGH (ref 38–126)
Anion gap: 7 (ref 5–15)
BUN: 13 mg/dL (ref 8–23)
CO2: 26 mmol/L (ref 22–32)
Calcium: 8.5 mg/dL — ABNORMAL LOW (ref 8.9–10.3)
Chloride: 102 mmol/L (ref 98–111)
Creatinine, Ser: 1.02 mg/dL (ref 0.61–1.24)
GFR, Estimated: >60 mL/min (ref >60)
Glucose, Bld: 97 mg/dL (ref 70–99)
Potassium: 4.1 mmol/L (ref 3.5–5.1)
Sodium: 135 mmol/L (ref 135–145)
Total Bilirubin: 11.3 mg/dL — ABNORMAL HIGH (ref 0.0–1.2)
Total Protein: 6.5 g/dL (ref 6.5–8.1)

## 2023-12-09 SURGERY — LAPAROSCOPIC CHOLECYSTECTOMY WITH INTRAOPERATIVE CHOLANGIOGRAM
Anesthesia: General

## 2023-12-09 MED ORDER — GADOBUTROL 1 MMOL/ML IV SOLN
6.0000 mL | Freq: Once | INTRAVENOUS | Status: AC | PRN
  Administered 2023-12-09: 6 mL via INTRAVENOUS

## 2023-12-09 NOTE — Plan of Care (Signed)
   Problem: Nutrition: Goal: Adequate nutrition will be maintained Outcome: Progressing   Problem: Safety: Goal: Ability to remain free from injury will improve Outcome: Progressing   Problem: Skin Integrity: Goal: Risk for impaired skin integrity will decrease Outcome: Progressing

## 2023-12-09 NOTE — Plan of Care (Signed)

## 2023-12-09 NOTE — Progress Notes (Signed)
 3 Days Post-Op   Subjective/Chief Complaint: No complaints   Objective: Vital signs in last 24 hours: Temp:  [97.4 F (36.3 C)-98.6 F (37 C)] 97.4 F (36.3 C) (02/27 0522) Pulse Rate:  [56-57] 56 (02/27 0522) Resp:  [16-18] 18 (02/27 0522) BP: (116-126)/(68-71) 126/71 (02/27 0522) SpO2:  [96 %-98 %] 96 % (02/27 0522) Last BM Date : 12/05/23  Intake/Output from previous day: 02/26 0701 - 02/27 0700 In: 1020 [P.O.:420; IV Piggyback:600] Out: 350 [Urine:350] Intake/Output this shift: No intake/output data recorded.  General appearance: alert and cooperative Resp: clear to auscultation bilaterally Cardio: regular rate and rhythm GI: soft, nontender  Lab Results:  Recent Labs    12/07/23 0421 12/08/23 0444  WBC 9.1 6.9  HGB 11.9* 12.0*  HCT 35.0* 36.6*  PLT 136* 144*   BMET Recent Labs    12/07/23 0421 12/08/23 0444  NA 138 137  K 4.6 4.2  CL 104 105  CO2 25 25  GLUCOSE 105* 95  BUN 20 14  CREATININE 1.42* 1.28*  CALCIUM 8.5* 8.2*   PT/INR No results for input(s): "LABPROT", "INR" in the last 72 hours. ABG No results for input(s): "PHART", "HCO3" in the last 72 hours.  Invalid input(s): "PCO2", "PO2"  Studies/Results: No results found.  Anti-infectives: Anti-infectives (From admission, onward)    Start     Dose/Rate Route Frequency Ordered Stop   12/07/23 1145  ceFEPIme (MAXIPIME) 2 g in sodium chloride 0.9 % 100 mL IVPB        2 g 200 mL/hr over 30 Minutes Intravenous Every 12 hours 12/07/23 1048     12/07/23 1145  metroNIDAZOLE (FLAGYL) IVPB 500 mg        500 mg 100 mL/hr over 60 Minutes Intravenous Every 12 hours 12/07/23 1048     12/05/23 1400  piperacillin-tazobactam (ZOSYN) IVPB 3.375 g  Status:  Discontinued        3.375 g 100 mL/hr over 30 Minutes Intravenous Every 8 hours 12/05/23 1026 12/05/23 1028   12/05/23 1200  piperacillin-tazobactam (ZOSYN) IVPB 3.375 g  Status:  Discontinued        3.375 g 12.5 mL/hr over 240 Minutes  Intravenous Every 8 hours 12/05/23 1028 12/07/23 1048       Assessment/Plan: s/p Procedure(s): ENDOSCOPIC RETROGRADE CHOLANGIOPANCREATOGRAPHY (ERCP) (N/A) SPHINCTEROTOMY REMOVAL OF STONES npo Recheck lft's this am. If improving then will plan for lap chole. Risks and benefits of the surgery as well as some of the technical aspects including risk of cbd injury were discussed with pt and he understands and wishes to proceed  LOS: 3 days    Chevis Pretty III 12/09/2023

## 2023-12-09 NOTE — Progress Notes (Addendum)
 Daily Progress Note  DOA: 12/05/2023 Hospital Day: 5   Chief Complaint:  choledocholithiasis   ASSESSMENT    Brief Narrative:  Chad SMELTZER is a 89 y.o. 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 Admitted 2/23 with abdominal pain, abnormal LFTs. Found to have CBD stones    Cholelithiasis / choledocholithiasis, s/p ERCP with sphincterotomy and stone extraction 12/06/23. Today :  Worsening LFTs. Bilirubin continues to rise ( 6.8 > 8.1 > 11.3). Intermittent RLQ discomfort which he feels is related to lying in bed. Otherwise no abdominal pain. Afebrile  History of pernicious anemia.  Hgb stable at 12. Gets B12 supplementation  Thrombocytopenia ( since November 2024) Platelets stable at 144. No splenomegaly on imaging  Stage IIIb Cecal AdenoCa s/p R hemicolectomy 03/2023 followed by neoadjuvant chemo.    PLAN   --General Surgery holding off on lap cholecystectomy until rising Bilirubin can be sorted out.  --Will order repeat MRI / MRCP.  --am LFTs  Subjective   Mild intermittent RLQ discomfort. Otherwise, no abdominal pain or other GI complaints   Objective    Recent Labs    12/07/23 0421 12/08/23 0444  WBC 9.1 6.9  HGB 11.9* 12.0*  HCT 35.0* 36.6*  PLT 136* 144*   BMET Recent Labs    12/07/23 0421 12/08/23 0444 12/09/23 0834  NA 138 137 135  K 4.6 4.2 4.1  CL 104 105 102  CO2 25 25 26   GLUCOSE 105* 95 97  BUN 20 14 13   CREATININE 1.42* 1.28* 1.02  CALCIUM 8.5* 8.2* 8.5*   LFT Recent Labs    12/09/23 0834  PROT 6.5  ALBUMIN 2.8*  AST 116*  ALT 150*  ALKPHOS 496*  BILITOT 11.3*   PT/INR No results for input(s): "LABPROT", "INR" in the last 72 hours.   Imaging:  DG ERCP CLINICAL DATA:  Biliary colic.  EXAM: ERCP  TECHNIQUE: Multiple spot images obtained  with the fluoroscopic device and submitted for interpretation post-procedure.  FLUOROSCOPY: Radiation Exposure Index (as provided by the fluoroscopic device): 167.5 mGy Kerma  COMPARISON:  MRI 12/05/2023  FINDINGS: Common bile duct and main pancreatic duct were cannulated with wires. Retrograde cholangiogram demonstrates small filling defects in the bile ducts. Cystic duct is patent. Evidence for a balloon sweep. Probable contrast within the main pancreatic duct.  IMPRESSION: Small filling defects in the biliary system could represent small stones.  These images were submitted for radiologic interpretation only.  Electronically Signed   By: Richarda Overlie M.D.   On: 12/07/2023 11:04     Scheduled inpatient medications:  Continuous inpatient infusions:   ceFEPime (MAXIPIME) IV 2 g (12/08/23 2306)   metronidazole 500 mg (12/08/23 2351)   PRN inpatient medications: acetaminophen **OR** acetaminophen, ondansetron **OR** ondansetron (ZOFRAN) IV, mouth rinse  Vital signs in last 24 hours: Temp:  [97.4 F (36.3 C)-98.6 F (37 C)] 97.4 F (36.3 C) (02/27 0522) Pulse Rate:  [56-57] 56 (02/27 0522) Resp:  [16-18] 18 (02/27 0522) BP: (116-126)/(68-71) 126/71 (02/27 0522) SpO2:  [96 %-98 %] 96 % (02/27 0522) Last BM Date : 12/05/23  Intake/Output Summary (Last 24 hours) at 12/09/2023 1051 Last data filed at 12/09/2023 0500 Gross per 24 hour  Intake 1020 ml  Output 350 ml  Net 670 ml    Intake/Output from previous day: 02/26 0701 - 02/27  0700 In: 1020 [P.O.:420; IV Piggyback:600] Out: 350 [Urine:350] Intake/Output this shift: No intake/output data recorded.   Physical Exam:  General: Alert male in NAD Heart:  Regular rate and rhythm.  Pulmonary: Normal respiratory effort Abdomen: Soft, nondistended, nontender. Normal bowel sounds. Extremities: No lower extremity edema  Neurologic: Alert and oriented Psych: Pleasant. Cooperative. Insight appears normal.      LOS: 3  days   Willette Cluster ,NP 12/09/2023, 10:51 AM  GI ATTENDING  Interval history data reviewed.  Patient seen and examined.  Agree with interval progress note.  Patient with progressively elevated liver test.  Etiology unclear.  Question edema at the ampulla.  Question other.  Agree with plans to hold off on surgery for now.  Also, agree with plans for repeat MRCP to determine if he may need a repeat ERCP.  Will follow.  Wilhemina Bonito. Eda Keys., M.D. [facility] Division of Gastroenterology

## 2023-12-09 NOTE — Progress Notes (Signed)
 Progress Note   Patient: Chad Fisher NGE:[id number] DOB: 1933-03-16 DOA: 12/05/2023     3 DOS: the patient was seen and examined on 12/09/2023   Brief hospital course: 89 y.o. male with a PMH significant for skin cancer, right inguinal hernia, cataract, hypothyroidism, hyperlipidemia CKD, senile purpura, hematochezia, colon cancer, anemia, urolithiasis who presented to the emergency department with complaints of abdominal pain associated with chills, nausea and emesis that woke him up from sleep. He has been having on and off abdominal pain with workup showing abnormal LFTs with hyperbilirubinemia for over a month.  His symptoms and labs were improving until he started having these episodic pain.  Came to ER and MRCP showed 2 mm stone in distal and common bile duct.  With elevated temp and white count.  Admitted to [facility] hospitalist and GI consulted.  Status post ERCP and stone retrieval/sphincterotomy 2/24.  Surgery following for possible chole if bili normalizes. GI following as he continues to have elevated bili.  Assessment and Plan: Choledocholithiasis No evidence of cholecystitis. S/p ERCP 2/24 with biliary sphincterotomy. Elevated LFTs, bili still high today at 11. Tolerating clears. GI follow-up appreciated. MRCP ordered. General surgery plan to take him to lap chole once LFTs trend down.  Acute kidney injury- Improved with IV fluids. Encourage oral fluids.   Avoid nephrotoxic drugs.  Hyperlipidemia Hold statins due to elevated LFTs.  Hypothyroidism Continue home dose levothyroxine.  History of pernicious anemia Hemoglobin stable, B12 normal.  Stage IIIb cecal adenocarcinoma status post right hemicolectomy, chemotherapy. Outpatient oncology follow-up advised.    Out of bed to chair. Incentive spirometry. Nursing supportive care. Fall, aspiration precautions. DVT prophylaxis   Code Status: Full Code  Subjective: Patient is seen and examined today morning. Frustrated  about his stay as bili still high and he is on clears since Saturday. He is aware of surgery plan. No nausea or vomiting. Wishes to go home.  Physical Exam: Vitals:   12/08/23 1202 12/08/23 2048 12/09/23 0522 12/09/23 1210  BP: 116/71 125/68 126/71 136/61  Pulse: (!) 57 (!) 56 (!) 56 (!) 56  Resp: 16 18 18 18   Temp: 98.6 F (37 C) 98.5 F (36.9 C) (!) 97.4 F (36.3 C) (!) 97.4 F (36.3 C)  TempSrc:  Oral Oral Oral  SpO2: 97% 98% 96% 100%  Weight:      Height:        General - Elderly thin built Caucasian male, no apparent distress HEENT - PERRLA, EOMI, icteric conjunctiva, atraumatic head, non tender sinuses. Lung - Clear, no rales, rhonchi, wheezes. Heart - S1, S2 heard, no murmurs, rubs, no pedal edema. Abdomen - Soft, non tender, bowel sounds good Neuro - Alert, awake and oriented x 3, non focal exam. Skin - icteric skin, warm and dry.  Data Reviewed:      Latest Ref Rng & Units 12/08/2023    4:44 AM 12/07/2023    4:21 AM 12/06/2023    4:09 AM  CBC  WBC 4.0 - 10.5 K/uL 6.9  9.1  12.9   Hemoglobin 13.0 - 17.0 g/dL 40.1  02.7  25.3   Hematocrit 39.0 - 52.0 % 36.6  35.0  35.1   Platelets 150 - 400 K/uL 144  136  152       Latest Ref Rng & Units 12/09/2023    8:34 AM 12/08/2023    4:44 AM 12/07/2023    4:21 AM  BMP  Glucose 70 - 99 mg/dL 97  95  664  BUN 8 - 23 mg/dL 13  14  20    Creatinine 0.61 - 1.24 mg/dL 3.08  6.57  8.46   Sodium 135 - 145 mmol/L 135  137  138   Potassium 3.5 - 5.1 mmol/L 4.1  4.2  4.6   Chloride 98 - 111 mmol/L 102  105  104   CO2 22 - 32 mmol/L 26  25  25    Calcium 8.9 - 10.3 mg/dL 8.5  8.2  8.5    No results found.  Family Communication: Discussed with patient, he understand and agree. All questions answereed.  Disposition: Status is: Inpatient Remains inpatient appropriate because: awaiting MRCP, GI, surgical follow up.  Planned Discharge Destination: Home with Home Health     Time spent: 38 minutes  Author: Marcelino Duster, MD 12/09/2023 6:11 PM Secure chat 7am to 7pm For on call review [email or web address].

## 2023-12-09 NOTE — Progress Notes (Signed)
 Patient ID: Chad Fisher, male   DOB: 22-Oct-1932, 89 y.o.   MRN: [id number] Bili up to 11. Surgery on hold. Will ask GI to see again

## 2023-12-10 ENCOUNTER — Inpatient Hospital Stay (HOSPITAL_COMMUNITY): Payer: Medicare Other | Admitting: Anesthesiology

## 2023-12-10 ENCOUNTER — Other Ambulatory Visit: Payer: Self-pay

## 2023-12-10 ENCOUNTER — Encounter (HOSPITAL_COMMUNITY)
Admission: EM | Disposition: A | Discharge status: Home / Self Care | Payer: Self-pay | Source: Home / Self Care | Attending: Student

## 2023-12-10 DIAGNOSIS — R7989 Other specified abnormal findings of blood chemistry: Secondary | ICD-10-CM | POA: Diagnosis not present

## 2023-12-10 DIAGNOSIS — K8021 Calculus of gallbladder without cholecystitis with obstruction: Secondary | ICD-10-CM | POA: Diagnosis not present

## 2023-12-10 DIAGNOSIS — D7589 Other specified diseases of blood and blood-forming organs: Secondary | ICD-10-CM | POA: Diagnosis not present

## 2023-12-10 DIAGNOSIS — K8010 Calculus of gallbladder with chronic cholecystitis without obstruction: Secondary | ICD-10-CM | POA: Diagnosis not present

## 2023-12-10 DIAGNOSIS — R1011 Right upper quadrant pain: Secondary | ICD-10-CM

## 2023-12-10 HISTORY — PX: CHOLECYSTECTOMY: SHX55

## 2023-12-10 LAB — COMPREHENSIVE METABOLIC PANEL
ALT: 112 U/L — ABNORMAL HIGH (ref 0–44)
AST: 68 U/L — ABNORMAL HIGH (ref 15–41)
Albumin: 2.6 g/dL — ABNORMAL LOW (ref 3.5–5.0)
Alkaline Phosphatase: 469 U/L — ABNORMAL HIGH (ref 38–126)
Anion gap: 7 (ref 5–15)
BUN: 15 mg/dL (ref 8–23)
CO2: 26 mmol/L (ref 22–32)
Calcium: 8.4 mg/dL — ABNORMAL LOW (ref 8.9–10.3)
Chloride: 104 mmol/L (ref 98–111)
Creatinine, Ser: 1.13 mg/dL (ref 0.61–1.24)
GFR, Estimated: >60 mL/min (ref >60)
Glucose, Bld: 96 mg/dL (ref 70–99)
Potassium: 4.1 mmol/L (ref 3.5–5.1)
Sodium: 137 mmol/L (ref 135–145)
Total Bilirubin: 7.3 mg/dL — ABNORMAL HIGH (ref 0.0–1.2)
Total Protein: 5.9 g/dL — ABNORMAL LOW (ref 6.5–8.1)

## 2023-12-10 SURGERY — LAPAROSCOPIC CHOLECYSTECTOMY
Anesthesia: General | Site: Abdomen

## 2023-12-10 MED ORDER — 0.9 % SODIUM CHLORIDE (POUR BTL) OPTIME
TOPICAL | Status: DC | PRN
  Administered 2023-12-10: 1000 mL

## 2023-12-10 MED ORDER — OXYCODONE HCL 5 MG PO TABS
5.0000 mg | ORAL_TABLET | ORAL | Status: DC | PRN
  Administered 2023-12-10 – 2023-12-12 (×5): 5 mg via ORAL
  Filled 2023-12-10 (×6): qty 1

## 2023-12-10 MED ORDER — LIDOCAINE HCL (PF) 2 % IJ SOLN
INTRAMUSCULAR | Status: AC
  Filled 2023-12-10: qty 5

## 2023-12-10 MED ORDER — FENTANYL CITRATE (PF) 100 MCG/2ML IJ SOLN
INTRAMUSCULAR | Status: AC
  Filled 2023-12-10: qty 2

## 2023-12-10 MED ORDER — OXYCODONE HCL 5 MG/5ML PO SOLN: 5.0000 mg | Freq: Once | ORAL | Status: DC | PRN

## 2023-12-10 MED ORDER — DEXAMETHASONE SODIUM PHOSPHATE 10 MG/ML IJ SOLN
INTRAMUSCULAR | Status: AC
  Filled 2023-12-10: qty 1

## 2023-12-10 MED ORDER — SUCCINYLCHOLINE CHLORIDE 200 MG/10ML IV SOSY
PREFILLED_SYRINGE | INTRAVENOUS | Status: DC | PRN
  Administered 2023-12-10: 120 mg via INTRAVENOUS

## 2023-12-10 MED ORDER — LIDOCAINE HCL (CARDIAC) PF 100 MG/5ML IV SOSY
PREFILLED_SYRINGE | INTRAVENOUS | Status: DC | PRN
  Administered 2023-12-10: 60 mg via INTRAVENOUS

## 2023-12-10 MED ORDER — LACTATED RINGERS IV SOLN: INTRAVENOUS | Status: DC

## 2023-12-10 MED ORDER — ROCURONIUM BROMIDE 10 MG/ML (PF) SYRINGE
PREFILLED_SYRINGE | INTRAVENOUS | Status: AC
  Filled 2023-12-10: qty 10

## 2023-12-10 MED ORDER — CHLORHEXIDINE GLUCONATE 0.12 % MT SOLN
15.0000 mL | Freq: Once | OROMUCOSAL | Status: AC
  Administered 2023-12-10: 15 mL via OROMUCOSAL

## 2023-12-10 MED ORDER — LACTATED RINGERS IV SOLN
INTRAVENOUS | Status: AC | PRN
Start: 2023-12-10 — End: 2023-12-10
  Administered 2023-12-10: 1000 mL

## 2023-12-10 MED ORDER — FENTANYL CITRATE PF 50 MCG/ML IJ SOSY
25.0000 ug | PREFILLED_SYRINGE | INTRAMUSCULAR | Status: DC | PRN
  Administered 2023-12-10: 25 ug via INTRAVENOUS

## 2023-12-10 MED ORDER — EPHEDRINE SULFATE-NACL 50-0.9 MG/10ML-% IV SOSY
PREFILLED_SYRINGE | INTRAVENOUS | Status: DC | PRN
  Administered 2023-12-10: 10 mg via INTRAVENOUS

## 2023-12-10 MED ORDER — ORAL CARE MOUTH RINSE: 15.0000 mL | Freq: Once | OROMUCOSAL | Status: AC

## 2023-12-10 MED ORDER — FENTANYL CITRATE PF 50 MCG/ML IJ SOSY
PREFILLED_SYRINGE | INTRAMUSCULAR | Status: AC
  Administered 2023-12-10: 25 ug via INTRAVENOUS
  Filled 2023-12-10: qty 1

## 2023-12-10 MED ORDER — FENTANYL CITRATE (PF) 100 MCG/2ML IJ SOLN
INTRAMUSCULAR | Status: DC | PRN
  Administered 2023-12-10 (×3): 50 ug via INTRAVENOUS

## 2023-12-10 MED ORDER — PROPOFOL 10 MG/ML IV BOLUS
INTRAVENOUS | Status: DC | PRN
  Administered 2023-12-10: 120 mg via INTRAVENOUS

## 2023-12-10 MED ORDER — SUGAMMADEX SODIUM 200 MG/2ML IV SOLN
INTRAVENOUS | Status: AC
  Filled 2023-12-10: qty 2

## 2023-12-10 MED ORDER — PROPOFOL 10 MG/ML IV BOLUS
INTRAVENOUS | Status: AC
  Filled 2023-12-10: qty 20

## 2023-12-10 MED ORDER — PHENYLEPHRINE 80 MCG/ML (10ML) SYRINGE FOR IV PUSH (FOR BLOOD PRESSURE SUPPORT)
PREFILLED_SYRINGE | INTRAVENOUS | Status: DC | PRN
  Administered 2023-12-10: 120 ug via INTRAVENOUS

## 2023-12-10 MED ORDER — ROCURONIUM BROMIDE 100 MG/10ML IV SOLN
INTRAVENOUS | Status: DC | PRN
  Administered 2023-12-10: 40 mg via INTRAVENOUS
  Administered 2023-12-10: 10 mg via INTRAVENOUS

## 2023-12-10 MED ORDER — BUPIVACAINE-EPINEPHRINE (PF) 0.25% -1:200000 IJ SOLN
INTRAMUSCULAR | Status: AC
  Filled 2023-12-10: qty 30

## 2023-12-10 MED ORDER — ONDANSETRON HCL 4 MG/2ML IJ SOLN
INTRAMUSCULAR | Status: AC
  Filled 2023-12-10: qty 2

## 2023-12-10 MED ORDER — MORPHINE SULFATE (PF) 2 MG/ML IV SOLN
1.0000 mg | INTRAVENOUS | Status: DC | PRN
Start: 2023-12-10 — End: 2023-12-12
  Administered 2023-12-10 (×3): 2 mg via INTRAVENOUS
  Filled 2023-12-10 (×3): qty 1

## 2023-12-10 MED ORDER — BUPIVACAINE-EPINEPHRINE 0.25% -1:200000 IJ SOLN
INTRAMUSCULAR | Status: DC | PRN
  Administered 2023-12-10: 24 mL

## 2023-12-10 MED ORDER — SUGAMMADEX SODIUM 200 MG/2ML IV SOLN
INTRAVENOUS | Status: DC | PRN
  Administered 2023-12-10 (×2): 200 mg via INTRAVENOUS

## 2023-12-10 MED ORDER — DEXAMETHASONE SODIUM PHOSPHATE 10 MG/ML IJ SOLN
INTRAMUSCULAR | Status: DC | PRN
  Administered 2023-12-10: 6 mg via INTRAVENOUS

## 2023-12-10 MED ORDER — OXYCODONE HCL 5 MG PO TABS: 5.0000 mg | ORAL_TABLET | Freq: Once | ORAL | Status: DC | PRN

## 2023-12-10 MED ORDER — DEXMEDETOMIDINE HCL IN NACL 80 MCG/20ML IV SOLN
INTRAVENOUS | Status: DC | PRN
  Administered 2023-12-10: 6 ug via INTRAVENOUS

## 2023-12-10 MED ORDER — AMISULPRIDE (ANTIEMETIC) 5 MG/2ML IV SOLN: 10.0000 mg | Freq: Once | INTRAVENOUS | Status: DC | PRN

## 2023-12-10 SURGICAL SUPPLY — 37 items
APPLIER CLIP 5 13 M/L LIGAMAX5 (MISCELLANEOUS) ×1 IMPLANT
BAG COUNTER SPONGE SURGICOUNT (BAG) IMPLANT
CABLE HIGH FREQUENCY MONO STRZ (ELECTRODE) ×2 IMPLANT
CATH REDDICK CHOLANGI 4FR 50CM (CATHETERS) IMPLANT
CHLORAPREP W/TINT 26 (MISCELLANEOUS) ×2 IMPLANT
CLIP APPLIE 5 13 M/L LIGAMAX5 (MISCELLANEOUS) ×2 IMPLANT
COVER MAYO STAND STRL (DRAPES) ×2 IMPLANT
COVER MAYO STAND XLG (MISCELLANEOUS) IMPLANT
COVER SURGICAL LIGHT HANDLE (MISCELLANEOUS) ×2 IMPLANT
DERMABOND ADVANCED .7 DNX12 (GAUZE/BANDAGES/DRESSINGS) ×2 IMPLANT
DEVICE TROCAR PUNCTURE CLOSURE (ENDOMECHANICALS) IMPLANT
DRAPE C-ARM 42X120 X-RAY (DRAPES) IMPLANT
ELECT REM PT RETURN 15FT ADLT (MISCELLANEOUS) ×2 IMPLANT
ENDOLOOP SUT PDS II 0 18 (SUTURE) IMPLANT
GLOVE BIO SURGEON STRL SZ7.5 (GLOVE) ×2 IMPLANT
GOWN STRL REUS W/ TWL XL LVL3 (GOWN DISPOSABLE) ×2 IMPLANT
HEMOSTAT SURGICEL 4X8 (HEMOSTASIS) ×2 IMPLANT
IRRIG SUCT STRYKERFLOW 2 WTIP (MISCELLANEOUS) ×1 IMPLANT
IRRIGATION SUCT STRKRFLW 2 WTP (MISCELLANEOUS) ×2 IMPLANT
IV CATH 14GX2 1/4 (CATHETERS) IMPLANT
KIT BASIN OR (CUSTOM PROCEDURE TRAY) ×2 IMPLANT
KIT TURNOVER KIT A (KITS) IMPLANT
PAD POSITIONING PINK XL (MISCELLANEOUS) IMPLANT
PROTECTOR NERVE ULNAR (MISCELLANEOUS) IMPLANT
SCISSORS LAP 5X35 DISP (ENDOMECHANICALS) ×2 IMPLANT
SET TUBE SMOKE EVAC HIGH FLOW (TUBING) ×2 IMPLANT
SLEEVE Z-THREAD 5X100MM (TROCAR) ×4 IMPLANT
SPIKE FLUID TRANSFER (MISCELLANEOUS) ×2 IMPLANT
SUT MNCRL AB 4-0 PS2 18 (SUTURE) ×2 IMPLANT
SYS BAG RETRIEVAL 10MM (BASKET) IMPLANT
SYSTEM BAG RETRIEVAL 10MM (BASKET) IMPLANT
TAPE CLOTH 4X10 WHT NS (GAUZE/BANDAGES/DRESSINGS) IMPLANT
TOWEL OR 17X26 10 PK STRL BLUE (TOWEL DISPOSABLE) ×2 IMPLANT
TRAY LAPAROSCOPIC (CUSTOM PROCEDURE TRAY) ×2 IMPLANT
TROCAR 11X100 Z THREAD (TROCAR) IMPLANT
TROCAR BALLN 12MMX100 BLUNT (TROCAR) ×2 IMPLANT
TROCAR Z-THREAD OPTICAL 5X100M (TROCAR) ×2 IMPLANT

## 2023-12-10 NOTE — Op Note (Signed)
 12/05/2023 - 12/10/2023  11:03 AM  PATIENT:  Chad Fisher  89 y.o. male  PRE-OPERATIVE DIAGNOSIS:  cholecystitis with cholelithiasis  POST-OPERATIVE DIAGNOSIS:  cholecystitis with cholelithiasis  PROCEDURE:  Procedure(s): LAPAROSCOPIC CHOLECYSTECTOMY (N/A)  SURGEON:  Surgeons and Role:    Griselda Miner, MD - Primary  PHYSICIAN ASSISTANT:   ASSISTANTS: none   ANESTHESIA:   local and general  EBL:  20 mL   BLOOD ADMINISTERED:none  DRAINS: none   LOCAL MEDICATIONS USED:  MARCAINE     SPECIMEN:  Source of Specimen:  gallbladder  DISPOSITION OF SPECIMEN:  PATHOLOGY  COUNTS:  YES  TOURNIQUET:  * No tourniquets in log *  DICTATION: .Dragon Dictation  After informed consent was obtained the patient was brought to the operating room and placed in the supine position on the operating table.  After adequate induction of general anesthesia the patient's abdomen was prepped with ChloraPrep, allowed to dry, and draped in usual sterile manner.  An appropriate timeout was performed.  A site was chosen to access the abdominal cavity in the left upper quadrant.  This area was infiltrated with quarter percent Marcaine.  A small stab incision was made with a 15 blade knife.  A 5 mm Optiview port and camera were used to bluntly dissected the layers of the abdominal wall under direct vision until access was gained to the abdominal cavity.  The abdomen was then insufflated with carbon dioxide without difficulty.  The abdomen was inspected.  There was some omental adhesions to the midline but the right upper quadrant was free.  I then placed three 5 mm ports across the right and mid abdomen under direct vision away from the adhesions.  I also placed a 10 mm port through the epigastric region also under direct vision.  A blunt grasper was placed through the lateralmost 5 mm port and used to grasp the dome of the gallbladder and elevated anteriorly and superiorly.  Care was taken to avoid injury to  the liver since it was fairly stiff and had some appearance of cirrhosis.  Another blunt grasper was placed through the other 5 mm port on the right and used to retract on the body and neck of the gallbladder.  The camera was moved to the 5 mm port near the midline.  A blunt dissector was placed through the epigastric port and the peritoneal reflection at the gallbladder neck was opened sharply with the electrocautery.  Blunt dissection was then carried out in this area until the gallbladder neck cystic duct junction was readily identified and a good critical window was created.  The gallbladder was very inflamed and the tissue was fairly thick.  There are dissection stayed right on the neck of the gallbladder.  With this amount of inflammation I felt it would be difficult to get a cholangiogram and he has had an ERCP recently that clean the common duct.  At this point I divided the neck of the gallbladder sharply with laparoscopic scissors.  I controlled the cystic duct with a Endoloop.  The gallbladder was then separated from the liver bed by sharp dissection with the hook cautery.  Once the gallbladder was detached from the liver bed it was then placed in a bag and the bag was sealed.  The gallbladder was then removed with the bag through the epigastric port without difficulty.  The abdomen was then irrigated with copious amounts of saline.  The liver bed was fulgurated with the cautery until it  was completely hemostatic.  I did elect to place a piece of Surgicel on the liver bed as well.  At this point the epigastric port was removed and the fascial defect was closed with interrupted 0 Vicryl stitches.  The rest of the ports were then removed under direct vision and the gas was allowed to escape.  No other abnormalities were noted on general inspection of the abdomen.  The skin incisions were closed with interrupted 4-0 Monocryl subcuticular stitches.  Dermabond dressings were applied.  The patient tolerated the  procedure well.  At the end of the case all needle sponge and instrument counts were correct.  The patient was then awakened and taken to recovery in stable condition.  PLAN OF CARE: Admit to inpatient   PATIENT DISPOSITION:  PACU - hemodynamically stable.   Delay start of Pharmacological VTE agent (>24hrs) due to surgical blood loss or risk of bleeding: no

## 2023-12-10 NOTE — Anesthesia Procedure Notes (Signed)
 Procedure Name: Intubation Date/Time: 12/10/2023 9:52 AM  Performed by: Randa Evens, CRNAPre-anesthesia Checklist: Patient identified, Emergency Drugs available, Suction available and Patient being monitored Patient Re-evaluated:Patient Re-evaluated prior to induction Oxygen Delivery Method: Circle System Utilized Preoxygenation: Pre-oxygenation with 100% oxygen Induction Type: IV induction Ventilation: Mask ventilation without difficulty Laryngoscope Size: Glidescope and 4 (LoPro S4) Tube type: Oral Number of attempts: 1 Airway Equipment and Method: Stylet and Oral airway Placement Confirmation: ETT inserted through vocal cords under direct vision, positive ETCO2 and breath sounds checked- equal and bilateral Secured at: 21 cm Tube secured with: Tape Dental Injury: Teeth and Oropharynx as per pre-operative assessment  Difficulty Due To: Difficulty was anticipated

## 2023-12-10 NOTE — Progress Notes (Signed)
 * Day of Surgery *   Subjective/Chief Complaint: No complaints   Objective: Vital signs in last 24 hours: Temp:  [97.4 F (36.3 C)-98.6 F (37 C)] 98.6 F (37 C) (02/28 0421) Pulse Rate:  [53-56] 53 (02/28 0421) Resp:  [16-18] 16 (02/28 0421) BP: (109-136)/(61-74) 116/69 (02/28 0421) SpO2:  [96 %-100 %] 96 % (02/28 0421) Last BM Date : 12/05/23  Intake/Output from previous day: 02/27 0701 - 02/28 0700 In: 660.7 [P.O.:360; IV Piggyback:300.7] Out: 250 [Urine:250] Intake/Output this shift: No intake/output data recorded.  General appearance: alert and cooperative Resp: clear to auscultation bilaterally Cardio: regular rate and rhythm GI: soft, nontender  Lab Results:  Recent Labs    12/08/23 0444  WBC 6.9  HGB 12.0*  HCT 36.6*  PLT 144*   BMET Recent Labs    12/09/23 0834 12/10/23 0414  NA 135 137  K 4.1 4.1  CL 102 104  CO2 26 26  GLUCOSE 97 96  BUN 13 15  CREATININE 1.02 1.13  CALCIUM 8.5* 8.4*   PT/INR No results for input(s): "LABPROT", "INR" in the last 72 hours. ABG No results for input(s): "PHART", "HCO3" in the last 72 hours.  Invalid input(s): "PCO2", "PO2"  Studies/Results: MR ABDOMEN MRCP W WO CONTAST Result Date: 12/09/2023 CLINICAL DATA:  Jaundice, rising bilirubin EXAM: MRI ABDOMEN WITHOUT AND WITH CONTRAST (INCLUDING MRCP) TECHNIQUE: Multiplanar multisequence MR imaging of the abdomen was performed both before and after the administration of intravenous contrast. Heavily T2-weighted images of the biliary and pancreatic ducts were obtained, and three-dimensional MRCP images were rendered by post processing. CONTRAST:  6mL GADAVIST GADOBUTROL 1 MMOL/ML IV SOLN COMPARISON:  12/05/2023 FINDINGS: Lower chest: Trace pleural effusions and associated atelectasis or consolidation. Hepatobiliary: No solid liver abnormality is seen. Minimal air within the gallbladder, presumably secondary to instrumentation (series 23, image 23). Tiny gallstones or  gravel in the dependent gallbladder (series 23, image 22). No persistent calculi within the common bile duct. Common bile duct remains at the upper limit of normal in caliber measuring 0.7 cm (series 23, image 24). Pancreas: Unremarkable. No pancreatic ductal dilatation or surrounding inflammatory changes. Spleen: Normal in size without significant abnormality. Adrenals/Urinary Tract: Adrenal glands are unremarkable. Simple and thinly septated benign bilateral renal cortical cysts, for which no specific further follow-up or characterization is required. Kidneys are otherwise normal, without renal calculi, solid lesion, or hydronephrosis. Stomach/Bowel: Stomach is within normal limits. No evidence of bowel wall thickening, distention, or inflammatory changes. Vascular/Lymphatic: No significant vascular findings are present. No enlarged abdominal lymph nodes. Other: No abdominal wall hernia or abnormality. No ascites. Musculoskeletal: No acute or significant osseous findings. IMPRESSION: 1. Tiny gallstones or gravel in the dependent gallbladder. Minimal air within the gallbladder, presumably secondary to instrumentation. 2. No persistent calculi within the common bile duct. Common bile duct remains at the upper limit of normal in caliber measuring 0.7 cm. 3. Trace pleural effusions and associated atelectasis or consolidation. Electronically Signed   By: Jearld Lesch M.D.   On: 12/09/2023 22:09   MR 3D Recon At Scanner Result Date: 12/09/2023 CLINICAL DATA:  Jaundice, rising bilirubin EXAM: MRI ABDOMEN WITHOUT AND WITH CONTRAST (INCLUDING MRCP) TECHNIQUE: Multiplanar multisequence MR imaging of the abdomen was performed both before and after the administration of intravenous contrast. Heavily T2-weighted images of the biliary and pancreatic ducts were obtained, and three-dimensional MRCP images were rendered by post processing. CONTRAST:  6mL GADAVIST GADOBUTROL 1 MMOL/ML IV SOLN COMPARISON:  12/05/2023 FINDINGS:  Lower chest:  Trace pleural effusions and associated atelectasis or consolidation. Hepatobiliary: No solid liver abnormality is seen. Minimal air within the gallbladder, presumably secondary to instrumentation (series 23, image 23). Tiny gallstones or gravel in the dependent gallbladder (series 23, image 22). No persistent calculi within the common bile duct. Common bile duct remains at the upper limit of normal in caliber measuring 0.7 cm (series 23, image 24). Pancreas: Unremarkable. No pancreatic ductal dilatation or surrounding inflammatory changes. Spleen: Normal in size without significant abnormality. Adrenals/Urinary Tract: Adrenal glands are unremarkable. Simple and thinly septated benign bilateral renal cortical cysts, for which no specific further follow-up or characterization is required. Kidneys are otherwise normal, without renal calculi, solid lesion, or hydronephrosis. Stomach/Bowel: Stomach is within normal limits. No evidence of bowel wall thickening, distention, or inflammatory changes. Vascular/Lymphatic: No significant vascular findings are present. No enlarged abdominal lymph nodes. Other: No abdominal wall hernia or abnormality. No ascites. Musculoskeletal: No acute or significant osseous findings. IMPRESSION: 1. Tiny gallstones or gravel in the dependent gallbladder. Minimal air within the gallbladder, presumably secondary to instrumentation. 2. No persistent calculi within the common bile duct. Common bile duct remains at the upper limit of normal in caliber measuring 0.7 cm. 3. Trace pleural effusions and associated atelectasis or consolidation. Electronically Signed   By: Jearld Lesch M.D.   On: 12/09/2023 22:09    Anti-infectives: Anti-infectives (From admission, onward)    Start     Dose/Rate Route Frequency Ordered Stop   12/07/23 1145  ceFEPIme (MAXIPIME) 2 g in sodium chloride 0.9 % 100 mL IVPB        2 g 200 mL/hr over 30 Minutes Intravenous Every 12 hours 12/07/23 1048      12/07/23 1145  metroNIDAZOLE (FLAGYL) IVPB 500 mg        500 mg 100 mL/hr over 60 Minutes Intravenous Every 12 hours 12/07/23 1048     12/05/23 1400  piperacillin-tazobactam (ZOSYN) IVPB 3.375 g  Status:  Discontinued        3.375 g 100 mL/hr over 30 Minutes Intravenous Every 8 hours 12/05/23 1026 12/05/23 1028   12/05/23 1200  piperacillin-tazobactam (ZOSYN) IVPB 3.375 g  Status:  Discontinued        3.375 g 12.5 mL/hr over 240 Minutes Intravenous Every 8 hours 12/05/23 1028 12/07/23 1048       Assessment/Plan: s/p Procedure(s): LAPAROSCOPIC CHOLECYSTECTOMY POSSIBLE OPEN (N/A) LFTs are finally improving.  I feel he is ready for surgery.  We will plan for laparoscopic cholecystectomy but he does have high risk of needing an open operation given his recent right colectomy.  I have discussed with him in detail the risk and benefits of the operation as well as some of the technical aspects and he understands and wishes to proceed.  LOS: 4 days    Chad Fisher 12/10/2023

## 2023-12-10 NOTE — Transfer of Care (Signed)
 Immediate Anesthesia Transfer of Care Note  Patient: Chad Fisher  Procedure(s) Performed: LAPAROSCOPIC CHOLECYSTECTOMY (Abdomen)  Patient Location: PACU  Anesthesia Type:General  Level of Consciousness: sedated  Airway & Oxygen Therapy: Patient Spontanous Breathing  Post-op Assessment: Report given to RN and Post -op Vital signs reviewed and stable  Post vital signs: Reviewed and stable  Last Vitals:  Vitals Value Taken Time  BP 185/73 12/10/23 1115  Temp    Pulse 51 12/10/23 1116  Resp 22 12/10/23 1116  SpO2 100 % 12/10/23 1116  Vitals shown include unfiled device data.  Last Pain:  Vitals:   12/10/23 0855  TempSrc: Oral  PainSc: 0-No pain         Complications:  Encounter Notable Events  Notable Event Outcome Phase Comment  Difficult to intubate - expected  Intraprocedure Filed from anesthesia note documentation.

## 2023-12-10 NOTE — Anesthesia Preprocedure Evaluation (Addendum)
 Anesthesia Evaluation  Patient identified by MRN, date of birth, ID band Patient awake    Reviewed: Allergy & Precautions, NPO status , Patient's Chart, lab work & pertinent test results  History of Anesthesia Complications Negative for: history of anesthetic complications  Airway Mallampati: III  TM Distance: >3 FB Neck ROM: Full   Comment: Previous grade I view with glidescope 3, easy mask. Also previous grade I view with MAC 4. Dental  (+) Dental Advisory Given All teeth have crowns:   Pulmonary neg pulmonary ROS   Pulmonary exam normal breath sounds clear to auscultation       Cardiovascular (-) hypertension(-) angina (-) Past MI, (-) Cardiac Stents and (-) CABG (-) dysrhythmias  Rhythm:Regular Rate:Normal  HLD   Neuro/Psych negative neurological ROS     GI/Hepatic negative GI ROS,,,Elevated LFTs   Endo/Other  Hypothyroidism    Renal/GU CRFRenal disease     Musculoskeletal   Abdominal   Peds  Hematology  (+) Blood dyscrasia, anemia Lab Results      Component                Value               Date                      WBC                      6.9                 12/08/2023                HGB                      12.0 (L)            12/08/2023                HCT                      36.6 (L)            12/08/2023                MCV                      107.6 (H)           12/08/2023                PLT                      144 (L)             12/08/2023              Anesthesia Other Findings jaundice  Reproductive/Obstetrics                             Anesthesia Physical Anesthesia Plan  ASA: 2  Anesthesia Plan: General   Post-op Pain Management:    Induction: Intravenous  PONV Risk Score and Plan: 2 and Dexamethasone, Treatment may vary due to age or medical condition and Ondansetron  Airway Management Planned: Oral ETT  Additional Equipment:   Intra-op Plan:    Post-operative Plan: Extubation in OR  Informed Consent: I have reviewed the patients History and Physical, chart, labs and discussed the  procedure including the risks, benefits and alternatives for the proposed anesthesia with the patient or authorized representative who has indicated his/her understanding and acceptance.     Dental advisory given  Plan Discussed with: CRNA and Anesthesiologist  Anesthesia Plan Comments: (Risks of general anesthesia discussed including, but not limited to, sore throat, hoarse voice, chipped/damaged teeth, injury to vocal cords, nausea and vomiting, allergic reactions, lung infection, heart attack, stroke, and death. All questions answered. )        Anesthesia Quick Evaluation

## 2023-12-10 NOTE — Progress Notes (Signed)
 Home. PROGRESS NOTE  FAROUK VIVERO ZOX:[id number] DOB: 1932/12/04   PCP: Shelva Majestic, MD  Patient is from: Home.  DOA: 12/05/2023 LOS: 4  Chief complaints Chief Complaint  Patient presents with   Abdominal Pain   Chills     Brief Narrative / Interim history: 89 year old M with PMH of colon cancer, CKD-3B, hypothyroidism, anemia and right inguinal hernia presented to ED with complaints of abdominal pain, nausea, emesis and chills, and admitted with working diagnosis of obstructive jaundice in the setting of choledocholithiasis and cholelithiasis.  MRCP showed 2 mm stone in distal and common bile duct.  He had elevated temperature with leukocytosis.  He was started on broad-spectrum antibiotics.  He underwent ERCP with stone retrieval and sphincterectomy on 2/24.  However, patient's bilirubin remained elevated delaying laparoscopic cholecystectomy until 12/09/2022.  Subjective: Seen and examined this afternoon.  Patient had laparoscopic cholecystectomy this morning.  Patient's wife at bedside.  Has no complaints other than some soreness for which she received some pain medication.  He is hoping to go home in the morning.  Objective: Vitals:   12/10/23 1200 12/10/23 1215 12/10/23 1232 12/10/23 1412  BP: (!) 160/76 (!) 163/67 (!) 178/68 (!) 146/75  Pulse: (!) 52 (!) 52 (!) 49 (!) 57  Resp: 16 16    Temp:  97.7 F (36.5 C) (!) 97.4 F (36.3 C) (!) 97.4 F (36.3 C)  TempSrc:   Oral Oral  SpO2: 99% 92% 96% 95%  Weight:      Height:        Examination:  GENERAL: No apparent distress.  Nontoxic. HEENT: MMM.  Vision and hearing grossly intact.  NECK: Supple.  No apparent JVD.  RESP:  No IWOB.  Fair aeration bilaterally. CVS:  RRR. Heart sounds normal.  ABD/GI/GU: BS+. Abd soft.  Appropriately tender. MSK/EXT:  Moves extremities. No apparent deformity. No edema.  SKIN: no apparent skin lesion or wound NEURO: Awake, alert and oriented appropriately.  No apparent focal neuro  deficit. PSYCH: Calm. Normal affect.   Procedures:  2/24-ERCP with stone retrieval and sphincterectomy 2/28-laparoscopic cholecystectomy  Microbiology summarized: 2/23-COVID-19, influenza and RSV PCR nonreactive  Assessment and plan: Cholelithiasis/choledocholithiasis Elevated liver enzymes/hyperbilirubinemia-likely due to choledocholithiasis -S/p ERCP with stone retrieval and sphincterectomy on 12/06/2023 -S/p laparoscopic cholecystectomy on 12/10/2023 -Monitor LFT -Continue cefepime and Flagyl for now   Acute kidney injury-baseline Cr ~1.0-1.1.  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   Macrocytic anemia: Anemia panel with some iron deficiency in 03/2023. -Recheck anemia panel  Body mass index is 21.9 kg/m.          DVT prophylaxis:  SCDs Start: 12/05/23 8119  Code Status: Full code Family Communication: Updated patient's wife at bedside Level of care: Med-Surg Status is: Inpatient Remains inpatient appropriate because: Choledocholithiasis   Final disposition: Home Consultants:  General surgery Gastroenterology  35 minutes with more than 50% spent in reviewing records, counseling patient/family and coordinating care.   Sch Meds:  Scheduled Meds: Continuous Infusions:  ceFEPime (MAXIPIME) IV Stopped (12/10/23 1235)  metronidazole 500 mg (12/09/23 2328)   PRN Meds:.acetaminophen **OR** acetaminophen, morphine injection, ondansetron **OR** ondansetron (ZOFRAN) IV, mouth rinse,  oxyCODONE  Antimicrobials: Anti-infectives (From admission, onward)    Start     Dose/Rate Route Frequency Ordered Stop   12/07/23 1145  ceFEPIme (MAXIPIME) 2 g in sodium chloride 0.9 % 100 mL IVPB        2 g 200 mL/hr over 30 Minutes Intravenous Every 12 hours 12/07/23 1048     12/07/23 1145  metroNIDAZOLE (FLAGYL) IVPB 500 mg        500 mg 100 mL/hr over 60 Minutes Intravenous Every 12 hours 12/07/23 1048     12/05/23 1400  piperacillin-tazobactam (ZOSYN) IVPB 3.375 g  Status:  Discontinued        3.375 g 100 mL/hr over 30 Minutes Intravenous Every 8 hours 12/05/23 1026 12/05/23 1028   12/05/23 1200  piperacillin-tazobactam (ZOSYN) IVPB 3.375 g  Status:  Discontinued        3.375 g 12.5 mL/hr over 240 Minutes Intravenous Every 8 hours 12/05/23 1028 12/07/23 1048        I have personally reviewed the following labs and images: CBC: Recent Labs  Lab 12/05/23 0355 12/06/23 0409 12/07/23 0421 12/08/23 0444  WBC 12.2* 12.9* 9.1 6.9  NEUTROABS 11.5*  --   --   --   HGB 13.9 11.7* 11.9* 12.0*  HCT 41.8 35.1* 35.0* 36.6*  MCV 107.7* 110.0* 107.0* 107.6*  PLT 213 152 136* 144*   BMP &GFR Recent Labs  Lab 12/06/23 0409 12/07/23 0421 12/08/23 0444 12/09/23 0834 12/10/23 0414  NA 137 138 137 135 137  K 4.7 4.6 4.2 4.1 4.1  CL 104 104 105 102 104  CO2 26 25 25 26 26   GLUCOSE 92 105* 95 97 96  BUN 20 20 14 13 15   CREATININE 1.24 1.42* 1.28* 1.02 1.13  CALCIUM 8.3* 8.5* 8.2* 8.5* 8.4*   Estimated Creatinine Clearance: 41.7 mL/min (by C-G formula based on SCr of 1.13 mg/dL). Liver & Pancreas: Recent Labs  Lab 12/06/23 0409 12/07/23 0421 12/08/23 0444 12/09/23 0834 12/10/23 0414  AST 164* 91* 107* 116* 68*  ALT 215* 154* 146* 150* 112*  ALKPHOS 178* 234* 327* 496* 469*  BILITOT 7.3* 6.8* 8.1* 11.3* 7.3*  PROT 5.5* 5.6* 5.5* 6.5 5.9*  ALBUMIN 2.7* 2.7* 2.5* 2.8* 2.6*   Recent Labs  Lab 12/05/23 0355 12/06/23 0409  LIPASE 56* 29   No results for input(s):  "AMMONIA" in the last 168 hours. Diabetic: No results for input(s): "HGBA1C" in the last 72 hours. No results for input(s): "GLUCAP" in the last 168 hours. Cardiac Enzymes: No results for input(s): "CKTOTAL", "CKMB", "CKMBINDEX", "TROPONINI" in the last 168 hours. No results for input(s): "PROBNP" in the last 8760 hours. Coagulation Profile: No results for input(s): "INR", "PROTIME" in the last 168 hours. Thyroid Function Tests: No results for input(s): "TSH", "T4TOTAL", "FREET4", "T3FREE", "THYROIDAB" in the last 72 hours. Lipid Profile: No results for input(s): "CHOL", "HDL", "LDLCALC", "TRIG", "CHOLHDL", "LDLDIRECT" in the last 72 hours. Anemia Panel: No results for input(s): "VITAMINB12", "FOLATE", "FERRITIN", "TIBC", "IRON", "RETICCTPCT" in the last 72 hours. Urine analysis:    Component Value Date/Time   COLORURINE YELLOW 12/05/2023 0335   APPEARANCEUR CLEAR 12/05/2023 0335   LABSPEC 1.017 12/05/2023 0335   PHURINE 6.0 12/05/2023 0335   GLUCOSEU NEGATIVE 12/05/2023 0335   GLUCOSEU >=1000 (A) 11/25/2023 1027   HGBUR SMALL (A) 12/05/2023 0335   HGBUR negative 09/24/2010 1004  BILIRUBINUR NEGATIVE 12/05/2023 0335   BILIRUBINUR positive 11/25/2023 0936   KETONESUR NEGATIVE 12/05/2023 0335   PROTEINUR NEGATIVE 12/05/2023 0335   UROBILINOGEN 0.2 11/25/2023 1027   UROBILINOGEN 2.0 (A) 11/25/2023 0936   NITRITE NEGATIVE 12/05/2023 0335   LEUKOCYTESUR NEGATIVE 12/05/2023 0335   LEUKOCYTESUR Negative 01/24/2018 0000   Sepsis Labs: Invalid input(s): "PROCALCITONIN", "LACTICIDVEN"  Microbiology: Recent Results (from the past 240 hours)  Resp panel by RT-PCR (RSV, Flu A&B, Covid) Anterior Nasal Swab     Status: None   Collection Time: 12/05/23  3:36 AM   Specimen: Anterior Nasal Swab  Result Value Ref Range Status   SARS Coronavirus 2 by RT PCR NEGATIVE NEGATIVE Final    Comment: (NOTE) SARS-CoV-2 target nucleic acids are NOT DETECTED.  The SARS-CoV-2 RNA is generally  detectable in upper respiratory specimens during the acute phase of infection. The lowest concentration of SARS-CoV-2 viral copies this assay can detect is 138 copies/mL. A negative result does not preclude SARS-Cov-2 infection and should not be used as the sole basis for treatment or other patient management decisions. A negative result may occur with  improper specimen collection/handling, submission of specimen other than nasopharyngeal swab, presence of viral mutation(s) within the areas targeted by this assay, and inadequate number of viral copies(<138 copies/mL). A negative result must be combined with clinical observations, patient history, and epidemiological information. The expected result is Negative.  Fact Sheet for Patients:  BloggerCourse.com  Fact Sheet for Healthcare Providers:  SeriousBroker.it  This test is no t yet approved or cleared by the [place] FDA and  has been authorized for detection and/or diagnosis of SARS-CoV-2 by FDA under an Emergency Use Authorization (EUA). This EUA will remain  in effect (meaning this test can be used) for the duration of the COVID-19 declaration under Section 564(b)(1) of the Act, 21 U.S.C.section 360bbb-3(b)(1), unless the authorization is terminated  or revoked sooner.       Influenza A by PCR NEGATIVE NEGATIVE Final   Influenza B by PCR NEGATIVE NEGATIVE Final    Comment: (NOTE) The Xpert Xpress SARS-CoV-2/FLU/RSV plus assay is intended as an aid in the diagnosis of influenza from Nasopharyngeal swab specimens and should not be used as a sole basis for treatment. Nasal washings and aspirates are unacceptable for Xpert Xpress SARS-CoV-2/FLU/RSV testing.  Fact Sheet for Patients: BloggerCourse.com  Fact Sheet for Healthcare Providers: SeriousBroker.it  This test is not yet approved or cleared by the [place] FDA  and has been authorized for detection and/or diagnosis of SARS-CoV-2 by FDA under an Emergency Use Authorization (EUA). This EUA will remain in effect (meaning this test can be used) for the duration of the COVID-19 declaration under Section 564(b)(1) of the Act, 21 U.S.C. section 360bbb-3(b)(1), unless the authorization is terminated or revoked.     Resp Syncytial Virus by PCR NEGATIVE NEGATIVE Final    Comment: (NOTE) Fact Sheet for Patients: BloggerCourse.com  Fact Sheet for Healthcare Providers: SeriousBroker.it  This test is not yet approved or cleared by the [place] FDA and has been authorized for detection and/or diagnosis of SARS-CoV-2 by FDA under an Emergency Use Authorization (EUA). This EUA will remain in effect (meaning this test can be used) for the duration of the COVID-19 declaration under Section 564(b)(1) of the Act, 21 U.S.C. section 360bbb-3(b)(1), unless the authorization is terminated or revoked.  Performed at [facility], [address], [place], [place] [address]     Radiology Studies: No results found.  Rand Etchison T. Jencarlo Bonadonna [facility]  If 7PM-7AM, please contact night-coverage [email or web address] 12/10/2023, 3:46 PM

## 2023-12-10 NOTE — Anesthesia Postprocedure Evaluation (Signed)
 Anesthesia Post Note  Patient: Chad Fisher  Procedure(s) Performed: LAPAROSCOPIC CHOLECYSTECTOMY (Abdomen)     Patient location during evaluation: PACU Anesthesia Type: General Level of consciousness: awake Pain management: pain level controlled Vital Signs Assessment: post-procedure vital signs reviewed and stable Respiratory status: spontaneous breathing, nonlabored ventilation and respiratory function stable Cardiovascular status: blood pressure returned to baseline and stable Postop Assessment: no apparent nausea or vomiting Anesthetic complications: yes   Encounter Notable Events  Notable Event Outcome Phase Comment  Difficult to intubate - expected  Intraprocedure Filed from anesthesia note documentation.    Last Vitals:  Vitals:   12/10/23 1200 12/10/23 1215  BP: (!) 160/76 (!) 163/67  Pulse: (!) 52 (!) 52  Resp: 16 16  Temp:  36.5 C  SpO2: 99% 92%    Last Pain:  Vitals:   12/10/23 1215  TempSrc:   PainSc: Asleep                 Linton Rump

## 2023-12-10 NOTE — Discharge Instructions (Signed)

## 2023-12-11 ENCOUNTER — Encounter (HOSPITAL_COMMUNITY): Payer: Self-pay | Admitting: General Surgery

## 2023-12-11 DIAGNOSIS — D7589 Other specified diseases of blood and blood-forming organs: Secondary | ICD-10-CM | POA: Diagnosis not present

## 2023-12-11 DIAGNOSIS — K8050 Calculus of bile duct without cholangitis or cholecystitis without obstruction: Secondary | ICD-10-CM | POA: Diagnosis not present

## 2023-12-11 DIAGNOSIS — K7460 Unspecified cirrhosis of liver: Secondary | ICD-10-CM | POA: Insufficient documentation

## 2023-12-11 DIAGNOSIS — K8021 Calculus of gallbladder without cholecystitis with obstruction: Secondary | ICD-10-CM | POA: Diagnosis not present

## 2023-12-11 DIAGNOSIS — R7989 Other specified abnormal findings of blood chemistry: Secondary | ICD-10-CM | POA: Diagnosis not present

## 2023-12-11 DIAGNOSIS — K8042 Calculus of bile duct with acute cholecystitis without obstruction: Secondary | ICD-10-CM | POA: Diagnosis not present

## 2023-12-11 DIAGNOSIS — K8001 Calculus of gallbladder with acute cholecystitis with obstruction: Secondary | ICD-10-CM

## 2023-12-11 LAB — RETICULOCYTES
Immature Retic Fract: 13.7 % (ref 2.3–15.9)
RBC.: 3.8 MIL/uL — ABNORMAL LOW (ref 4.22–5.81)
Retic Count, Absolute: 42.9 10*3/uL (ref 19.0–186.0)
Retic Ct Pct: 1.1 % (ref 0.4–3.1)

## 2023-12-11 LAB — COMPREHENSIVE METABOLIC PANEL
ALT: 95 U/L — ABNORMAL HIGH (ref 0–44)
AST: 60 U/L — ABNORMAL HIGH (ref 15–41)
Albumin: 2.8 g/dL — ABNORMAL LOW (ref 3.5–5.0)
Alkaline Phosphatase: 447 U/L — ABNORMAL HIGH (ref 38–126)
Anion gap: 9 (ref 5–15)
BUN: 16 mg/dL (ref 8–23)
CO2: 24 mmol/L (ref 22–32)
Calcium: 8.6 mg/dL — ABNORMAL LOW (ref 8.9–10.3)
Chloride: 104 mmol/L (ref 98–111)
Creatinine, Ser: 1.11 mg/dL (ref 0.61–1.24)
GFR, Estimated: >60 mL/min (ref >60)
Glucose, Bld: 119 mg/dL — ABNORMAL HIGH (ref 70–99)
Potassium: 4.1 mmol/L (ref 3.5–5.1)
Sodium: 137 mmol/L (ref 135–145)
Total Bilirubin: 5.1 mg/dL — ABNORMAL HIGH (ref 0.0–1.2)
Total Protein: 6.6 g/dL (ref 6.5–8.1)

## 2023-12-11 LAB — VITAMIN B12: Vitamin B-12: 606 pg/mL (ref 180–914)

## 2023-12-11 LAB — CBC
HCT: 39.1 % (ref 39.0–52.0)
Hemoglobin: 13.3 g/dL (ref 13.0–17.0)
MCH: 35.6 pg — ABNORMAL HIGH (ref 26.0–34.0)
MCHC: 34 g/dL (ref 30.0–36.0)
MCV: 104.5 fL — ABNORMAL HIGH (ref 80.0–100.0)
Platelets: 184 10*3/uL (ref 150–400)
RBC: 3.74 MIL/uL — ABNORMAL LOW (ref 4.22–5.81)
RDW: 14.8 % (ref 11.5–15.5)
WBC: 16 10*3/uL — ABNORMAL HIGH (ref 4.0–10.5)
nRBC: 0 % (ref 0.0–0.2)

## 2023-12-11 LAB — IRON AND TIBC
Iron: 45 ug/dL (ref 45–182)
Saturation Ratios: 13 % — ABNORMAL LOW (ref 17.9–39.5)
TIBC: 336 ug/dL (ref 250–450)
UIBC: 291 ug/dL

## 2023-12-11 LAB — MAGNESIUM: Magnesium: 1.8 mg/dL (ref 1.7–2.4)

## 2023-12-11 LAB — PHOSPHORUS: Phosphorus: 2.9 mg/dL (ref 2.5–4.6)

## 2023-12-11 LAB — FOLATE: Folate: 13.7 ng/mL (ref >5.9)

## 2023-12-11 LAB — FERRITIN: Ferritin: 268 ng/mL (ref 24–336)

## 2023-12-11 MED ORDER — SALINE SPRAY 0.65 % NA SOLN: 1.0000 | Freq: Four times a day (QID) | NASAL | Status: DC | PRN

## 2023-12-11 MED ORDER — MAGIC MOUTHWASH: 15.0000 mL | Freq: Four times a day (QID) | ORAL | Status: DC | PRN

## 2023-12-11 MED ORDER — PHENOL 1.4 % MT LIQD: 2.0000 | OROMUCOSAL | Status: DC | PRN

## 2023-12-11 MED ORDER — MENTHOL 3 MG MT LOZG
1.0000 | LOZENGE | OROMUCOSAL | Status: DC | PRN
Start: 2023-12-11 — End: 2023-12-12

## 2023-12-11 MED ORDER — METHOCARBAMOL 500 MG PO TABS: 1000.0000 mg | ORAL_TABLET | Freq: Four times a day (QID) | ORAL | Status: DC | PRN

## 2023-12-11 MED ORDER — ALUM & MAG HYDROXIDE-SIMETH 200-200-20 MG/5ML PO SUSP: 30.0000 mL | Freq: Four times a day (QID) | ORAL | Status: DC | PRN

## 2023-12-11 MED ORDER — BISACODYL 10 MG RE SUPP
10.0000 mg | Freq: Every day | RECTAL | Status: DC
  Administered 2023-12-11: 10 mg via RECTAL
  Filled 2023-12-11: qty 1

## 2023-12-11 MED ORDER — NAPHAZOLINE-GLYCERIN 0.012-0.25 % OP SOLN: 1.0000 [drp] | Freq: Four times a day (QID) | OPHTHALMIC | Status: DC | PRN

## 2023-12-11 MED ORDER — SODIUM CHLORIDE 0.9% FLUSH: 3.0000 mL | INTRAVENOUS | Status: DC | PRN

## 2023-12-11 MED ORDER — SODIUM CHLORIDE 0.9% FLUSH
3.0000 mL | Freq: Two times a day (BID) | INTRAVENOUS | Status: DC
  Administered 2023-12-11 (×2): 3 mL via INTRAVENOUS

## 2023-12-11 MED ORDER — SODIUM CHLORIDE 0.9 % IV SOLN: 250.0000 mL | INTRAVENOUS | Status: DC | PRN

## 2023-12-11 MED ORDER — METHOCARBAMOL 1000 MG/10ML IJ SOLN
1000.0000 mg | Freq: Four times a day (QID) | INTRAMUSCULAR | Status: DC | PRN
  Administered 2023-12-11 (×2): 1000 mg via INTRAVENOUS
  Filled 2023-12-11 (×2): qty 10

## 2023-12-11 MED ORDER — LACTATED RINGERS IV BOLUS: 1000.0000 mL | Freq: Three times a day (TID) | INTRAVENOUS | Status: DC | PRN

## 2023-12-11 MED ORDER — SIMETHICONE 40 MG/0.6ML PO SUSP: 80.0000 mg | Freq: Four times a day (QID) | ORAL | Status: DC | PRN

## 2023-12-11 MED ORDER — CALCIUM POLYCARBOPHIL 625 MG PO TABS
625.0000 mg | ORAL_TABLET | Freq: Two times a day (BID) | ORAL | Status: DC
  Administered 2023-12-11 (×2): 625 mg via ORAL
  Filled 2023-12-11 (×2): qty 1

## 2023-12-11 NOTE — Progress Notes (Signed)
 12/11/2023  Chad Fisher [id number] August 09, 1933  CARE TEAM: PCP: Shelva Majestic, MD  Outpatient Care Team: Patient Care Team: Shelva Majestic, MD as PCP - General (Family Medicine) Marcine Matar, MD as Consulting Physician (Urology) Arminda Resides, MD as Consulting Physician (Dermatology) Mateo Flow, MD as Consulting Physician (Ophthalmology) Jerrell Mylar, Jacquelyne Balint, RN as Oncology Nurse Navigator  Inpatient Treatment Team: Treatment Team:  Almon Hercules, MD Sabra Heck, [place] Lynann Bologna, MD Bishop Limbo, MD Lawrence Santiago, MD Candiss Norse, RN Liliane Bade, RN [name], [name], [name], [name], RN Redmond Baseman, RN   Problem List:   Principal Problem:   Hyperbilirubinemia Active Problems:   Cirrhosis of liver (HCC)   Hyperlipidemia   Hypothyroidism   CKD (chronic kidney disease), stage III (HCC)   Macrocytosis   Choledocholithiasis   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    Plan:  Following liquids.  Agree with soft diet.  Solid diet as tolerated  Try to adjust oral pain control  LFTs still elevated but trending downwards guardedly hopeful sign Dr. Carolynne Edouard thought patient had cirrhosis macronodular on surgery may explain that  Follow-up in pathology.  Most likely consistent with cholecystitis  Surgical drain for now.  Hopefully can remove later hospitalization and improved and no evidence of leak/LFTs were improving.  IV antibiotics postop but most likely do not need to continue at discharge.  -VTE prophylaxis- SCDs,  etc  -mobilize as tolerated to help recovery  -Disposition: Patient I think close to being discharged but would be good for him to have return of bowel function with flatus and bowel movements.  Will add fiber bowel regimen.  Hopefully home later today but most likely tomorrow.  Discussed with Dr. Alanda Slim with hospitalist who agrees       I reviewed nursing notes, hospitalist notes, last 24 h vitals and pain scores, last 48 h intake and output, last 24 h labs and trends, and last 24 h imaging results.  I have reviewed this patient's available data, including medical history, events of note, test results, etc as part of my evaluation.   A significant portion of that time was spent in counseling. Care during the described time interval was provided by me.  This care required moderate level of medical decision making.  12/11/2023    Subjective: (Chief complaint)  Patient walking better.  Tolerated some clear liquids.  No flatus or bowel movements.  No more nausea.  Hoping to go home soon  Objective:  Vital signs:  Vitals:   12/10/23 1412 12/10/23 2019 12/11/23 0002 12/11/23 0527  BP: (!) 146/75 123/71 125/74 133/67  Pulse: (!) 57 62 61 (!) 57  Resp:  17 17 17   Temp: (!) 97.4 F (36.3 C) 98.1 F (36.7 C) 97.8 F (36.6 C) 97.9 F (36.6 C)  TempSrc: Oral Oral Oral Oral  SpO2: 95% 95% 95% 95%  Weight:      Height:        Last BM Date : 12/05/23  Intake/Output   Yesterday:  02/28 0701 - 03/01 0700 In: 607.7 [I.V.:274.3; IV Piggyback:333.3]  Out: 20 [Blood:20] This shift:  Total I/O In: 360 [P.O.:360] Out: -   Bowel function:  Flatus: No  BM:  No  Drain: Serosanguinous   Physical Exam:  General: Pt awake/alert in no acute distress Eyes: PERRL, normal EOM.  Sclera clear.  Obvious jaundice and icterus. Neuro: CN II-XII intact w/o focal sensory/motor deficits. Lymph: No head/neck/groin lymphadenopathy Psych:  No delerium/psychosis/paranoia.  Oriented x  4 HENT: Normocephalic, Mucus membranes moist.  No thrush Neck: Supple, No tracheal deviation.  No obvious thyromegaly Chest: No pain to chest wall compression.  Good respiratory excursion.  No audible wheezing CV:  Pulses intact.  Regular rhythm.  No major extremity edema MS: Normal AROM mjr joints.  No obvious deformity  Abdomen: Soft.  Nondistended.  Nontender.  No evidence of peritonitis.  No incarcerated hernias.  Ext:   No deformity.  No mjr edema.  No cyanosis Skin: No petechiae / purpurea.  No major sores.  Warm and dry    Results:   Cultures: Recent Results (from the past 720 hours)  Urine Culture     Status: None   Collection Time: 11/25/23 10:12 AM   Specimen: Urine  Result Value Ref Range Status   MICRO NUMBER: [id number]  Final   SPECIMEN QUALITY: Adequate  Final   Sample Source URINE  Final   STATUS: FINAL  Final   Result: No Growth  Final  Resp panel by RT-PCR (RSV, Flu A&B, Covid) Anterior Nasal Swab     Status: None   Collection Time: 12/05/23  3:36 AM   Specimen: Anterior Nasal Swab  Result Value Ref Range Status   SARS Coronavirus 2 by RT PCR NEGATIVE NEGATIVE Final    Comment: (NOTE) SARS-CoV-2 target nucleic acids are NOT DETECTED.  The SARS-CoV-2 RNA is generally detectable in upper respiratory specimens during the acute phase of infection. The lowest concentration of SARS-CoV-2 viral copies this assay can detect is 138 copies/mL. A negative result does not preclude SARS-Cov-2 infection and should not be used as the sole basis for treatment or other patient management decisions. A negative result may occur with  improper specimen collection/handling, submission of specimen other than nasopharyngeal swab, presence of viral mutation(s) within the areas targeted by this assay, and inadequate number of viral copies(<138 copies/mL). A negative result must be combined with clinical observations, patient history, and epidemiological information. The expected  result is Negative.  Fact Sheet for Patients:  BloggerCourse.com  Fact Sheet for Healthcare Providers:  SeriousBroker.it  This test is no t yet approved or cleared by the [place] FDA and  has been authorized for detection and/or diagnosis of SARS-CoV-2 by FDA under an Emergency Use Authorization (EUA). This EUA will remain  in effect (meaning this test can be used) for the duration of the COVID-19 declaration under Section 564(b)(1) of the Act, 21 U.S.C.section 360bbb-3(b)(1), unless the authorization is terminated  or revoked sooner.       Influenza A by PCR NEGATIVE NEGATIVE Final   Influenza B by PCR NEGATIVE NEGATIVE Final    Comment: (NOTE) The Xpert Xpress SARS-CoV-2/FLU/RSV plus assay is intended as an aid in the diagnosis of influenza from Nasopharyngeal swab specimens and should not be used as a sole basis for treatment. Nasal washings and aspirates are unacceptable for Xpert Xpress SARS-CoV-2/FLU/RSV testing.  Fact Sheet for Patients: BloggerCourse.com  Fact Sheet for Healthcare Providers: SeriousBroker.it  This test is not yet approved or cleared by the [place] and has been authorized for  detection and/or diagnosis of SARS-CoV-2 by FDA under an Emergency Use Authorization (EUA). This EUA will remain in effect (meaning this test can be used) for the duration of the COVID-19 declaration under Section 564(b)(1) of the Act, 21 U.S.C. section 360bbb-3(b)(1), unless the authorization is terminated or revoked.     Resp Syncytial Virus by PCR NEGATIVE NEGATIVE Final    Comment: (NOTE) Fact Sheet for Patients: BloggerCourse.com  Fact Sheet for Healthcare Providers: SeriousBroker.it  This test is not yet approved or cleared by the [place] FDA and has been authorized for detection and/or diagnosis of  SARS-CoV-2 by FDA under an Emergency Use Authorization (EUA). This EUA will remain in effect (meaning this test can be used) for the duration of the COVID-19 declaration under Section 564(b)(1) of the Act, 21 U.S.C. section 360bbb-3(b)(1), unless the authorization is terminated or revoked.  Performed at [facility], [address], [place], [place] [address]     Labs: Results for orders placed or performed during the hospital encounter of 12/05/23 (from the past 48 hours)  Comprehensive metabolic panel     Status: Abnormal   Collection Time: 12/10/23  4:14 AM  Result Value Ref Range   Sodium 137 135 - 145 mmol/L   Potassium 4.1 3.5 - 5.1 mmol/L   Chloride 104 98 - 111 mmol/L   CO2 26 22 - 32 mmol/L   Glucose, Bld 96 70 - 99 mg/dL    Comment: Glucose reference range applies only to samples taken after fasting for at least 8 hours.   BUN 15 8 - 23 mg/dL   Creatinine, Ser 3.66 0.61 - 1.24 mg/dL   Calcium 8.4 (L) 8.9 - 10.3 mg/dL   Total Protein 5.9 (L) 6.5 - 8.1 g/dL   Albumin 2.6 (L) 3.5 - 5.0 g/dL   AST 68 (H) 15 - 41 U/L   ALT 112 (H) 0 - 44 U/L   Alkaline Phosphatase 469 (H) 38 - 126 U/L   Total Bilirubin 7.3 (H) 0.0 - 1.2 mg/dL    Comment: DELTA CHECK NOTED   GFR, Estimated >60 >60 mL/min    Comment: (NOTE) Calculated using the CKD-EPI Creatinine Equation (2021)    Anion gap 7 5 - 15    Comment: Performed at [facility], [address], [place], [place] [address]  Comprehensive metabolic panel     Status: Abnormal   Collection Time: 12/11/23  4:54 AM  Result Value Ref Range   Sodium 137 135 - 145 mmol/L   Potassium 4.1 3.5 - 5.1 mmol/L   Chloride 104 98 - 111 mmol/L   CO2 24 22 - 32 mmol/L   Glucose, Bld 119 (H) 70 - 99 mg/dL    Comment: Glucose reference range applies only to samples taken after fasting for at least 8 hours.   BUN 16 8 - 23 mg/dL   Creatinine, Ser 7.42 0.61 - 1.24 mg/dL   Calcium 8.6 (L) 8.9 - 10.3 mg/dL    Total Protein 6.6 6.5 - 8.1 g/dL   Albumin 2.8 (L) 3.5 - 5.0 g/dL   AST 60 (H) 15 - 41 U/L   ALT 95 (H) 0 - 44 U/L   Alkaline Phosphatase 447 (H) 38 - 126 U/L   Total Bilirubin 5.1 (H) 0.0 - 1.2 mg/dL   GFR, Estimated >59 >56 mL/min    Comment: (NOTE) Calculated using the CKD-EPI Creatinine Equation (2021)    Anion gap 9 5 - 15    Comment:  Performed at [facility], [address], [place], [place] [address]  CBC     Status: Abnormal   Collection Time: 12/11/23  4:54 AM  Result Value Ref Range   WBC 16.0 (H) 4.0 - 10.5 K/uL   RBC 3.74 (L) 4.22 - 5.81 MIL/uL   Hemoglobin 13.3 13.0 - 17.0 g/dL   HCT 56.2 13.0 - 86.5 %   MCV 104.5 (H) 80.0 - 100.0 fL   MCH 35.6 (H) 26.0 - 34.0 pg   MCHC 34.0 30.0 - 36.0 g/dL   RDW 78.4 69.6 - 29.5 %   Platelets 184 150 - 400 K/uL   nRBC 0.0 0.0 - 0.2 %    Comment: Performed at [facility], [address] [place], [place] [address]  Phosphorus     Status: None   Collection Time: 12/11/23  4:54 AM  Result Value Ref Range   Phosphorus 2.9 2.5 - 4.6 mg/dL    Comment: Performed at [facility], [address], [place], [place] [address]  Magnesium     Status: None   Collection Time: 12/11/23  4:54 AM  Result Value Ref Range   Magnesium 1.8 1.7 - 2.4 mg/dL    Comment: Performed at [facility], [address] [place], [place] [address]  Vitamin B12     Status: None   Collection Time: 12/11/23  4:54 AM  Result Value Ref Range   Vitamin B-12 606 180 - 914 pg/mL    Comment: (NOTE) This assay is not validated for testing neonatal or myeloproliferative syndrome specimens for Vitamin B12 levels. Performed at [facility], [address], [place], [place] [address]   Folate     Status: None   Collection Time: 12/11/23  4:54 AM  Result Value Ref Range   Folate 13.7 >5.9 ng/mL    Comment: Performed at [facility], [address],  [place], [place] [address]  Iron and TIBC     Status: Abnormal   Collection Time: 12/11/23  4:54 AM  Result Value Ref Range   Iron 45 45 - 182 ug/dL   TIBC 259 563 - 875 ug/dL   Saturation Ratios 13 (L) 17.9 - 39.5 %   UIBC 291 ug/dL    Comment: Performed at [facility], [address], [place], [place] [address]  Ferritin     Status: None   Collection Time: 12/11/23  4:54 AM  Result Value Ref Range   Ferritin 268 24 - 336 ng/mL    Comment: Performed at [facility], [address] [place], [place] [address]  Reticulocytes     Status: Abnormal   Collection Time: 12/11/23  4:54 AM  Result Value Ref Range   Retic Ct Pct 1.1 0.4 - 3.1 %   RBC. 3.80 (L) 4.22 - 5.81 MIL/uL   Retic Count, Absolute 42.9 19.0 - 186.0 K/uL   Immature Retic Fract 13.7 2.3 - 15.9 %    Comment: Performed at [facility], [address] [place], [place] [address]    Imaging / Studies: MR ABDOMEN MRCP W WO CONTAST Result Date: 12/09/2023 CLINICAL DATA:  Jaundice, rising bilirubin EXAM: MRI ABDOMEN WITHOUT AND WITH CONTRAST (INCLUDING MRCP) TECHNIQUE: Multiplanar multisequence MR imaging of the abdomen was performed both before and after the administration of intravenous contrast. Heavily T2-weighted images of the biliary and pancreatic ducts were obtained, and three-dimensional MRCP images were rendered by post  processing. CONTRAST:  6mL GADAVIST GADOBUTROL 1 MMOL/ML IV SOLN COMPARISON:  12/05/2023 FINDINGS: Lower chest: Trace pleural effusions and associated atelectasis or consolidation. Hepatobiliary: No solid liver abnormality is seen. Minimal air within the gallbladder, presumably secondary to instrumentation (series 23, image 23). Tiny gallstones or gravel in the dependent gallbladder (series 23, image 22). No persistent calculi within the common bile duct. Common bile duct remains at the upper limit of normal in caliber measuring 0.7 cm (series 23, image 24).  Pancreas: Unremarkable. No pancreatic ductal dilatation or surrounding inflammatory changes. Spleen: Normal in size without significant abnormality. Adrenals/Urinary Tract: Adrenal glands are unremarkable. Simple and thinly septated benign bilateral renal cortical cysts, for which no specific further follow-up or characterization is required. Kidneys are otherwise normal, without renal calculi, solid lesion, or hydronephrosis. Stomach/Bowel: Stomach is within normal limits. No evidence of bowel wall thickening, distention, or inflammatory changes. Vascular/Lymphatic: No significant vascular findings are present. No enlarged abdominal lymph nodes. Other: No abdominal wall hernia or abnormality. No ascites. Musculoskeletal: No acute or significant osseous findings. IMPRESSION: 1. Tiny gallstones or gravel in the dependent gallbladder. Minimal air within the gallbladder, presumably secondary to instrumentation. 2. No persistent calculi within the common bile duct. Common bile duct remains at the upper limit of normal in caliber measuring 0.7 cm. 3. Trace pleural effusions and associated atelectasis or consolidation. Electronically Signed   By: Jearld Lesch M.D.   On: 12/09/2023 22:09   MR 3D Recon At Scanner Result Date: 12/09/2023 CLINICAL DATA:  Jaundice, rising bilirubin EXAM: MRI ABDOMEN WITHOUT AND WITH CONTRAST (INCLUDING MRCP) TECHNIQUE: Multiplanar multisequence MR imaging of the abdomen was performed both before and after the administration of intravenous contrast. Heavily T2-weighted images of the biliary and pancreatic ducts were obtained, and three-dimensional MRCP images were rendered by post processing. CONTRAST:  6mL GADAVIST GADOBUTROL 1 MMOL/ML IV SOLN COMPARISON:  12/05/2023 FINDINGS: Lower chest: Trace pleural effusions and associated atelectasis or consolidation. Hepatobiliary: No solid liver abnormality is seen. Minimal air within the gallbladder, presumably secondary to instrumentation (series  23, image 23). Tiny gallstones or gravel in the dependent gallbladder (series 23, image 22). No persistent calculi within the common bile duct. Common bile duct remains at the upper limit of normal in caliber measuring 0.7 cm (series 23, image 24). Pancreas: Unremarkable. No pancreatic ductal dilatation or surrounding inflammatory changes. Spleen: Normal in size without significant abnormality. Adrenals/Urinary Tract: Adrenal glands are unremarkable. Simple and thinly septated benign bilateral renal cortical cysts, for which no specific further follow-up or characterization is required. Kidneys are otherwise normal, without renal calculi, solid lesion, or hydronephrosis. Stomach/Bowel: Stomach is within normal limits. No evidence of bowel wall thickening, distention, or inflammatory changes. Vascular/Lymphatic: No significant vascular findings are present. No enlarged abdominal lymph nodes. Other: No abdominal wall hernia or abnormality. No ascites. Musculoskeletal: No acute or significant osseous findings. IMPRESSION: 1. Tiny gallstones or gravel in the dependent gallbladder. Minimal air within the gallbladder, presumably secondary to instrumentation. 2. No persistent calculi within the common bile duct. Common bile duct remains at the upper limit of normal in caliber measuring 0.7 cm. 3. Trace pleural effusions and associated atelectasis or consolidation. Electronically Signed   By: Jearld Lesch M.D.   On: 12/09/2023 22:09    Medications / Allergies: per chart  Antibiotics: Anti-infectives (From admission, onward)    Start     Dose/Rate Route Frequency Ordered Stop   12/07/23 1145  ceFEPIme (MAXIPIME) 2 g in sodium chloride 0.9 % 100 mL  IVPB        2 g 200 mL/hr over 30 Minutes Intravenous Every 12 hours 12/07/23 1048     12/07/23 1145  metroNIDAZOLE (FLAGYL) IVPB 500 mg        500 mg 100 mL/hr over 60 Minutes Intravenous Every 12 hours 12/07/23 1048     12/05/23 1400  piperacillin-tazobactam  (ZOSYN) IVPB 3.375 g  Status:  Discontinued        3.375 g 100 mL/hr over 30 Minutes Intravenous Every 8 hours 12/05/23 1026 12/05/23 1028   12/05/23 1200  piperacillin-tazobactam (ZOSYN) IVPB 3.375 g  Status:  Discontinued        3.375 g 12.5 mL/hr over 240 Minutes Intravenous Every 8 hours 12/05/23 1028 12/07/23 1048         Note: Portions of this report may have been transcribed using voice recognition software. Every effort was made to ensure accuracy; however, inadvertent computerized transcription errors may be present.   Any transcriptional errors that result from this process are unintentional.    Ardeth Sportsman, MD, FACS, MASCRS Esophageal, Gastrointestinal & Colorectal Surgery Robotic and Minimally Invasive Surgery  [facility] [address] [place], [place] [address] [phone] Fax [phone] Main  CONTACT INFORMATION: Weekday (9AM-5PM): Call [facility] main office at [phone] Weeknight (5PM-9AM) or Weekend/Holiday: Check EPIC "Web Links" tab & use "AMION" (password " [email or web address]") for General Surgery [facility] coverage  Please, DO NOT use SecureChat  (it is not reliable communication to reach operating surgeons & will lead to a delay in care).   Epic staff messaging available for outptient concerns needing 1-2 business day response.      12/11/2023  8:44 AM

## 2023-12-11 NOTE — Progress Notes (Signed)
 HISTORY OF PRESENT ILLNESS:  Chad Fisher is a 89 y.o. male who presented with calculus cholecystitis and choledocholithiasis.  Status post ERCP with sphincterotomy and stone clearance.  Underwent cholecystectomy yesterday.  Appreciate general surgery's help.  Had acute cholecystitis.  Little sore today but otherwise stable.  Liver test slowly coming down.  Bilirubin 5.1.  He is anxious to go home but understands that he needs to eat without difficulty and have a bowel movement.  REVIEW OF SYSTEMS:  All non-GI ROS.  Past Medical History:  Diagnosis Date   Cancer [facility])    skin   Hernia 1987   right inguinal     Past Surgical History:  Procedure Laterality Date   BIOPSY  04/06/2023   Procedure: BIOPSY;  Surgeon: Hilarie Fredrickson, MD;  Location: [facility];  Service: Gastroenterology;;   CATARACT EXTRACTION, BILATERAL     CHOLECYSTECTOMY N/A 12/10/2023   Procedure: LAPAROSCOPIC CHOLECYSTECTOMY;  Surgeon: Griselda Miner, MD;  Location: [facility];  Service: General;  Laterality: N/A;   COLONOSCOPY N/A 04/06/2023   Procedure: COLONOSCOPY;  Surgeon: Hilarie Fredrickson, MD;  Location: [facility];  Service: Gastroenterology;  Laterality: N/A;   ERCP N/A 12/06/2023   Procedure: ENDOSCOPIC RETROGRADE CHOLANGIOPANCREATOGRAPHY (ERCP);  Surgeon: Hilarie Fredrickson, MD;  Location: [facility];  Service: Gastroenterology;  Laterality: N/A;   HERNIA REPAIR     LAPAROTOMY N/A 04/08/2023   Procedure: EXPLORATORY LAPAROTOMY WITH RIGHT HEMICOLECTOMY;  Surgeon: Diamantina Monks, MD;  Location: [facility];  Service: General;  Laterality: N/A;   MOHS SURGERY     left ear   POLYPECTOMY  04/06/2023   Procedure: POLYPECTOMY;  Surgeon: Hilarie Fredrickson, MD;  Location: [facility];  Service: Gastroenterology;;   REMOVAL OF STONES  12/06/2023   Procedure: REMOVAL OF STONES;  Surgeon: Hilarie Fredrickson, MD;  Location: [facility];  Service: Gastroenterology;;   Dennison Mascot  12/06/2023   Procedure: Dennison Mascot;  Surgeon: Hilarie Fredrickson, MD;  Location: [facility];  Service: Gastroenterology;;   SUBMUCOSAL TATTOO INJECTION  04/06/2023   Procedure: SUBMUCOSAL TATTOO INJECTION;  Surgeon: Hilarie Fredrickson, MD;  Location: [facility];  Service: Gastroenterology;;    Social History Chad Fisher  reports that he has never smoked. He has never used smokeless tobacco. He reports that he does not drink alcohol and does not use drugs.  family history includes Anemia in his father; Benign prostatic hyperplasia in his brother; Breast cancer in his daughter; Cancer in his daughter; Kidney cancer in his son.  No Known Allergies     PHYSICAL EXAMINATION: Vital signs: BP (!) 150/82 (BP Location: Right Arm)   Pulse 72   Temp 97.8 F (36.6 C)   Resp 16   Ht 5\' 8"  (1.727 m)   Wt 65.3 kg   SpO2 95%   BMI 21.90 kg/m  General: Well-developed, well-nourished, no acute distress.  Jaundiced HEENT: Sclerae are icteric, conjunctiva pink. Oral mucosa intact Lungs: Clear Heart: Regular Abdomen: soft, tenderness at surgical incision sites, nondistended, no obvious ascites, no peritoneal signs, normal bowel sounds. No organomegaly. Extremities: No edema Psychiatric: alert and oriented x3. Cooperative   ASSESSMENT:  1.  Choledocholithiasis status post ERCP with sphincterotomy and stone extraction 2.  Acute cholecystitis s/p cholecystectomy 3.  Elevated liver test secondary to the same   PLAN:  1.  Postop management per general surgery. GI will sign off.  Wilhemina Bonito. Eda Keys., M.D. [facility] Division of Gastroenterology

## 2023-12-11 NOTE — Progress Notes (Signed)
 Home. PROGRESS NOTE  Chad Fisher:[id number] DOB: 1933/04/06   PCP: Shelva Majestic, MD  Patient is from: Home.  DOA: 12/05/2023 LOS: 5  Chief complaints Chief Complaint  Patient presents with   Abdominal Pain   Chills     Brief Narrative / Interim history: 89 year old M with PMH of colon cancer, CKD-3B, hypothyroidism, anemia and right inguinal hernia presented to ED with complaints of abdominal pain, nausea, emesis and chills, and admitted with working diagnosis of obstructive jaundice in the setting of choledocholithiasis and cholelithiasis.  MRCP showed 2 mm stone in distal and common bile duct.  He had elevated temperature with leukocytosis.  He was started on broad-spectrum antibiotics.  He underwent ERCP with stone retrieval and sphincterectomy on 2/24.  However, patient's bilirubin remained elevated delaying laparoscopic cholecystectomy until 12/09/2022.  Subjective: Seen and examined this afternoon.  No major events overnight of this morning.  No complaints.  Working on full liquid diet.  Eager to go home.  Has not a bowel movement or flatus yet.  Objective: Vitals:   12/10/23 1412 12/10/23 2019 12/11/23 0002 12/11/23 0527  BP: (!) 146/75 123/71 125/74 133/67  Pulse: (!) 57 62 61 (!) 57  Resp:  17 17 17   Temp: (!) 97.4 F (36.3 C) 98.1 F (36.7 C) 97.8 F (36.6 C) 97.9 F (36.6 C)  TempSrc: Oral Oral Oral Oral  SpO2: 95% 95% 95% 95%  Weight:      Height:        Examination:  GENERAL: No apparent distress.  Nontoxic. HEENT: MMM.  Vision and hearing grossly intact.  NECK: Supple.  No apparent JVD.  RESP:  No IWOB.  Fair aeration bilaterally. CVS:  RRR. Heart sounds normal.  ABD/GI/GU: BS+. Abd soft.  Appropriately tender. MSK/EXT:  Moves extremities. No apparent deformity. No edema.  SKIN: Laparoscopic wounds DCI. NEURO: Awake, alert and oriented appropriately.  No apparent focal neuro deficit. PSYCH: Calm. Normal affect.   Procedures:  2/24-ERCP with  stone retrieval and sphincterectomy 2/28-laparoscopic cholecystectomy  Microbiology summarized: 2/23-COVID-19, influenza and RSV PCR nonreactive  Assessment and plan: Calculus cholecystitis with choledocholithiasis Elevated liver enzymes/hyperbilirubinemia-likely due to the above.  Improved. -S/p ERCP with stone retrieval and sphincterectomy on 12/06/2023 -S/p laparoscopic cholecystectomy on 12/10/2023 -Monitor LFT-improving -Continue cefepime and Flagyl for now -Advance diet to soft -Pain control    Acute kidney injury-baseline Cr ~1.0-1.1.  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   Macrocytosis: Anemia panel without nutritional deficiency.  Hgb improved.  Body mass index is 21.9 kg/m.          DVT prophylaxis:  SCDs Start: 12/05/23 5284  Code Status: Full code Family Communication: None at bedside today. Level of care: Med-Surg Status is: Inpatient Remains inpatient appropriate because: Acute calculus cholecystitis with choledocholithiasis   Final disposition: Home on 3/2 Consultants:  General surgery Gastroenterology  35 minutes with more than 50% spent in reviewing records, counseling patient/family and coordinating care.   Sch Meds:  Scheduled Meds:  bisacodyl  10 mg Rectal Daily  polycarbophil  625 mg Oral BID   sodium chloride flush  3 mL Intravenous Q12H   Continuous Infusions:  sodium chloride     ceFEPime (MAXIPIME) IV 2 g (12/10/23 2317)   lactated ringers      metronidazole 500 mg (12/11/23 0002)   PRN Meds:.sodium chloride, acetaminophen **OR** acetaminophen, alum & mag hydroxide-simeth, lactated ringers, magic mouthwash, menthol-cetylpyridinium, methocarbamol (ROBAXIN) injection, methocarbamol, morphine injection, naphazoline-glycerin, ondansetron **OR** ondansetron (ZOFRAN) IV, mouth rinse, oxyCODONE, phenol, simethicone, sodium chloride, sodium chloride flush  Antimicrobials: Anti-infectives (From admission, onward)    Start     Dose/Rate Route Frequency Ordered Stop   12/07/23 1145  ceFEPIme (MAXIPIME) 2 g in sodium chloride 0.9 % 100 mL IVPB        2 g 200 mL/hr over 30 Minutes Intravenous Every 12 hours 12/07/23 1048     12/07/23 1145  metroNIDAZOLE (FLAGYL) IVPB 500 mg        500 mg 100 mL/hr over 60 Minutes Intravenous Every 12 hours 12/07/23 1048     12/05/23 1400  piperacillin-tazobactam (ZOSYN) IVPB 3.375 g  Status:  Discontinued        3.375 g 100 mL/hr over 30 Minutes Intravenous Every 8 hours 12/05/23 1026 12/05/23 1028   12/05/23 1200  piperacillin-tazobactam (ZOSYN) IVPB 3.375 g  Status:  Discontinued        3.375 g 12.5 mL/hr over 240 Minutes Intravenous Every 8 hours 12/05/23 1028 12/07/23 1048        I have personally reviewed the following labs and images: CBC: Recent Labs  Lab 12/05/23 0355 12/06/23 0409 12/07/23 0421 12/08/23 0444 12/11/23 0454  WBC 12.2* 12.9* 9.1 6.9 16.0*  NEUTROABS 11.5*  --   --   --   --   HGB 13.9 11.7* 11.9* 12.0* 13.3  HCT 41.8 35.1* 35.0* 36.6* 39.1  MCV 107.7* 110.0* 107.0* 107.6* 104.5*  PLT 213 152 136* 144* 184   BMP &GFR Recent Labs  Lab 12/07/23 0421 12/08/23 0444 12/09/23 0834 12/10/23 0414 12/11/23 0454  NA 138 137 135 137 137  K 4.6 4.2 4.1 4.1 4.1  CL 104 105 102 104 104  CO2 25 25 26 26 24   GLUCOSE 105* 95 97 96 119*  BUN 20 14 13 15 16   CREATININE 1.42* 1.28* 1.02 1.13 1.11  CALCIUM 8.5* 8.2* 8.5* 8.4* 8.6*  MG  --   --   --   --  1.8  PHOS  --   --   --    --  2.9   Estimated Creatinine Clearance: 42.5 mL/min (by C-G formula based on SCr of 1.11 mg/dL). Liver & Pancreas: Recent Labs  Lab 12/07/23 0421 12/08/23 0444 12/09/23 0834 12/10/23 0414 12/11/23 0454  AST 91* 107* 116* 68* 60*  ALT 154* 146* 150* 112* 95*  ALKPHOS 234* 327* 496* 469* 447*  BILITOT 6.8* 8.1* 11.3* 7.3* 5.1*  PROT 5.6* 5.5* 6.5 5.9* 6.6  ALBUMIN 2.7* 2.5* 2.8* 2.6* 2.8*   Recent Labs  Lab 12/05/23 0355 12/06/23 0409  LIPASE 56* 29   No results for input(s): "AMMONIA" in the last 168 hours. Diabetic: No results for input(s): "HGBA1C" in the last 72 hours. No results for input(s): "GLUCAP" in the last 168 hours. Cardiac Enzymes: No results for input(s): "CKTOTAL", "CKMB", "CKMBINDEX", "TROPONINI" in the last 168 hours. No results for input(s): "PROBNP" in the last 8760 hours. Coagulation Profile: No results for input(s): "INR", "PROTIME" in the last 168 hours. Thyroid Function Tests: No results  for input(s): "TSH", "T4TOTAL", "FREET4", "T3FREE", "THYROIDAB" in the last 72 hours. Lipid Profile: No results for input(s): "CHOL", "HDL", "LDLCALC", "TRIG", "CHOLHDL", "LDLDIRECT" in the last 72 hours. Anemia Panel: Recent Labs    12/11/23 0454  VITAMINB12 606  FOLATE 13.7  FERRITIN 268  TIBC 336  IRON 45  RETICCTPCT 1.1   Urine analysis:    Component Value Date/Time   COLORURINE YELLOW 12/05/2023 0335   APPEARANCEUR CLEAR 12/05/2023 0335   LABSPEC 1.017 12/05/2023 0335   PHURINE 6.0 12/05/2023 0335   GLUCOSEU NEGATIVE 12/05/2023 0335   GLUCOSEU >=1000 (A) 11/25/2023 1027   HGBUR SMALL (A) 12/05/2023 0335   HGBUR negative 09/24/2010 1004   BILIRUBINUR NEGATIVE 12/05/2023 0335   BILIRUBINUR positive 11/25/2023 0936   KETONESUR NEGATIVE 12/05/2023 0335   PROTEINUR NEGATIVE 12/05/2023 0335   UROBILINOGEN 0.2 11/25/2023 1027   UROBILINOGEN 2.0 (A) 11/25/2023 0936   NITRITE NEGATIVE 12/05/2023 0335   LEUKOCYTESUR NEGATIVE 12/05/2023 0335    LEUKOCYTESUR Negative 01/24/2018 0000   Sepsis Labs: Invalid input(s): "PROCALCITONIN", "LACTICIDVEN"  Microbiology: Recent Results (from the past 240 hours)  Resp panel by RT-PCR (RSV, Flu A&B, Covid) Anterior Nasal Swab     Status: None   Collection Time: 12/05/23  3:36 AM   Specimen: Anterior Nasal Swab  Result Value Ref Range Status   SARS Coronavirus 2 by RT PCR NEGATIVE NEGATIVE Final    Comment: (NOTE) SARS-CoV-2 target nucleic acids are NOT DETECTED.  The SARS-CoV-2 RNA is generally detectable in upper respiratory specimens during the acute phase of infection. The lowest concentration of SARS-CoV-2 viral copies this assay can detect is 138 copies/mL. A negative result does not preclude SARS-Cov-2 infection and should not be used as the sole basis for treatment or other patient management decisions. A negative result may occur with  improper specimen collection/handling, submission of specimen other than nasopharyngeal swab, presence of viral mutation(s) within the areas targeted by this assay, and inadequate number of viral copies(<138 copies/mL). A negative result must be combined with clinical observations, patient history, and epidemiological information. The expected result is Negative.  Fact Sheet for Patients:  BloggerCourse.com  Fact Sheet for Healthcare Providers:  SeriousBroker.it  This test is no t yet approved or cleared by the [place] FDA and  has been authorized for detection and/or diagnosis of SARS-CoV-2 by FDA under an Emergency Use Authorization (EUA). This EUA will remain  in effect (meaning this test can be used) for the duration of the COVID-19 declaration under Section 564(b)(1) of the Act, 21 U.S.C.section 360bbb-3(b)(1), unless the authorization is terminated  or revoked sooner.       Influenza A by PCR NEGATIVE NEGATIVE Final   Influenza B by PCR NEGATIVE NEGATIVE Final    Comment:  (NOTE) The Xpert Xpress SARS-CoV-2/FLU/RSV plus assay is intended as an aid in the diagnosis of influenza from Nasopharyngeal swab specimens and should not be used as a sole basis for treatment. Nasal washings and aspirates are unacceptable for Xpert Xpress SARS-CoV-2/FLU/RSV testing.  Fact Sheet for Patients: BloggerCourse.com  Fact Sheet for Healthcare Providers: SeriousBroker.it  This test is not yet approved or cleared by the [place] FDA and has been authorized for detection and/or diagnosis of SARS-CoV-2 by FDA under an Emergency Use Authorization (EUA). This EUA will remain in effect (meaning this test can be used) for the duration of the COVID-19 declaration under Section 564(b)(1) of the Act, 21 U.S.C. section 360bbb-3(b)(1), unless the authorization is terminated or revoked.  Resp Syncytial Virus by PCR NEGATIVE NEGATIVE Final    Comment: (NOTE) Fact Sheet for Patients: BloggerCourse.com  Fact Sheet for Healthcare Providers: SeriousBroker.it  This test is not yet approved or cleared by the [place] FDA and has been authorized for detection and/or diagnosis of SARS-CoV-2 by FDA under an Emergency Use Authorization (EUA). This EUA will remain in effect (meaning this test can be used) for the duration of the COVID-19 declaration under Section 564(b)(1) of the Act, 21 U.S.C. section 360bbb-3(b)(1), unless the authorization is terminated or revoked.  Performed at [facility], [address], [place], [place] [address]     Radiology Studies: No results found.    Codee Tutson T. Jerrilynn Mikowski [facility]  If 7PM-7AM, please contact night-coverage [email or web address] 12/11/2023, 10:40 AM

## 2023-12-12 DIAGNOSIS — R7989 Other specified abnormal findings of blood chemistry: Secondary | ICD-10-CM | POA: Diagnosis not present

## 2023-12-12 DIAGNOSIS — K8050 Calculus of bile duct without cholangitis or cholecystitis without obstruction: Secondary | ICD-10-CM | POA: Diagnosis not present

## 2023-12-12 DIAGNOSIS — K8010 Calculus of gallbladder with chronic cholecystitis without obstruction: Secondary | ICD-10-CM | POA: Diagnosis not present

## 2023-12-12 DIAGNOSIS — K8001 Calculus of gallbladder with acute cholecystitis with obstruction: Secondary | ICD-10-CM

## 2023-12-12 LAB — COMPREHENSIVE METABOLIC PANEL
ALT: 69 U/L — ABNORMAL HIGH (ref 0–44)
AST: 38 U/L (ref 15–41)
Albumin: 2.8 g/dL — ABNORMAL LOW (ref 3.5–5.0)
Alkaline Phosphatase: 314 U/L — ABNORMAL HIGH (ref 38–126)
Anion gap: 9 (ref 5–15)
BUN: 15 mg/dL (ref 8–23)
CO2: 24 mmol/L (ref 22–32)
Calcium: 8.5 mg/dL — ABNORMAL LOW (ref 8.9–10.3)
Chloride: 104 mmol/L (ref 98–111)
Creatinine, Ser: 1.04 mg/dL (ref 0.61–1.24)
GFR, Estimated: >60 mL/min (ref >60)
Glucose, Bld: 144 mg/dL — ABNORMAL HIGH (ref 70–99)
Potassium: 4.3 mmol/L (ref 3.5–5.1)
Sodium: 137 mmol/L (ref 135–145)
Total Bilirubin: 3.5 mg/dL — ABNORMAL HIGH (ref 0.0–1.2)
Total Protein: 6 g/dL — ABNORMAL LOW (ref 6.5–8.1)

## 2023-12-12 LAB — CBC
HCT: 36.2 % — ABNORMAL LOW (ref 39.0–52.0)
Hemoglobin: 11.5 g/dL — ABNORMAL LOW (ref 13.0–17.0)
MCH: 35 pg — ABNORMAL HIGH (ref 26.0–34.0)
MCHC: 31.8 g/dL (ref 30.0–36.0)
MCV: 110 fL — ABNORMAL HIGH (ref 80.0–100.0)
Platelets: 150 10*3/uL (ref 150–400)
RBC: 3.29 MIL/uL — ABNORMAL LOW (ref 4.22–5.81)
RDW: 15.2 % (ref 11.5–15.5)
WBC: 10.5 10*3/uL (ref 4.0–10.5)
nRBC: 0 % (ref 0.0–0.2)

## 2023-12-12 LAB — MAGNESIUM: Magnesium: 1.9 mg/dL (ref 1.7–2.4)

## 2023-12-12 MED ORDER — OXYCODONE HCL 5 MG PO TABS: 5.0000 mg | ORAL_TABLET | Freq: Four times a day (QID) | ORAL | 0 refills | Status: DC | PRN

## 2023-12-12 MED ORDER — ACETAMINOPHEN 325 MG PO TABS: 650.0000 mg | ORAL_TABLET | Freq: Four times a day (QID) | ORAL | Status: AC | PRN

## 2023-12-12 NOTE — Plan of Care (Signed)

## 2023-12-12 NOTE — Progress Notes (Signed)
 12/12/2023  Chad Fisher [id number] 11/11/1932  CARE TEAM: PCP: Shelva Majestic, MD  Outpatient Care Team: Patient Care Team: Shelva Majestic, MD as PCP - General (Family Medicine) Marcine Matar, MD as Consulting Physician (Urology) Arminda Resides, MD as Consulting Physician (Dermatology) Mateo Flow, MD as Consulting Physician (Ophthalmology) Jerrell Mylar, Jacquelyne Balint, RN as Oncology Nurse Navigator  Inpatient Treatment Team: Treatment Team:  Almon Hercules, MD Sabra Heck, NT [facility], Md, MD Lawrence Santiago, MD Redmond Baseman, RN [place], Essence, NT Ailene Rud, RN Bess Kinds, RN Barry Dienes, November, RN   Problem List:   Principal Problem:   Hyperbilirubinemia Active Problems:   Cirrhosis of liver (HCC)   Hyperlipidemia   Hypothyroidism   CKD (chronic kidney disease), stage III (HCC)   Macrocytosis   Choledocholithiasis   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    Plan:  Solid diet as tolerated  LFTs still elevated but trending downwards - hopeful sign Dr. Carolynne Edouard noted pt had cirrhosis macronodular on surgery =  may explain that  Follow-up in pathology.  Most likely consistent with cholecystitis  Surgical drain for now.  Hopefully can remove later hospitalization and improved and no evidence of leak/LFTs were improving.  IV antibiotics postop but most likely do not need to continue at discharge.  -VTE prophylaxis- SCDs, etc  -mobilize as tolerated to help  recovery  -Disposition: Safe for discharge from surgery standpoint    I reviewed nursing notes, hospitalist notes, last 24 h vitals and pain scores, last 48 h intake and output, last 24 h labs and trends, and last 24 h imaging results.  I have reviewed this patient's available data, including medical history, events of note, test results, etc as part of my evaluation.   A significant portion of that time was spent in counseling. Care during the described time interval was provided by me.  This care required moderate level of medical decision making.  12/12/2023    Subjective: (Chief complaint)  Patient feeling better.  Minimal soreness getting up.  Narcotic helps but usually using milder medicines.  Walking hallways.  Tolerate solid food.  Wants to go home.  Objective:  Vital signs:  Vitals:   12/11/23 0527 12/11/23 1221 12/11/23 2020 12/12/23 0518  BP: 133/67 (!) 150/82 117/63 (!) 146/70  Pulse: (!) 57 72 (!) 58 (!) 51  Resp: 17 16 16 16   Temp: 97.9 F (36.6 C) 97.8 F (36.6 C) 98.3 F (36.8 C) 98 F (36.7 C)  TempSrc: Oral     SpO2: 95% 95% 98% 98%  Weight:      Height:        Last BM Date : 12/05/23  Intake/Output   Yesterday:  03/01 0701 - 03/02 0700 In: 360 [P.O.:360] Out: 200 [Urine:200] This shift:  No intake/output data recorded.  Bowel function:  Flatus: YES  BM:  YES  Drain: Serosanguinous   Physical Exam:  General: Pt awake/alert in no acute distress Eyes: PERRL, normal EOM.  Sclera clear.  Obvious jaundice and icterus. Neuro: CN  II-XII intact w/o focal sensory/motor deficits. Lymph: No head/neck/groin lymphadenopathy Psych:  No delerium/psychosis/paranoia.  Oriented x 4 HENT: Normocephalic, Mucus membranes moist.  No thrush Neck: Supple, No tracheal deviation.  No obvious thyromegaly Chest: No pain to chest wall compression.  Good respiratory excursion.  No audible wheezing CV:  Pulses intact.  Regular rhythm.  No major extremity  edema MS: Normal AROM mjr joints.  No obvious deformity  Abdomen: Soft.  Nondistended.   Incisions clean dry intact.  Minimal tenderness. .  No evidence of peritonitis.  No incarcerated hernias.  Ext:   No deformity.  No mjr edema.  No cyanosis Skin: No petechiae / purpurea.  No major sores.  Warm and dry    Results:   Cultures: Recent Results (from the past 720 hours)  Urine Culture     Status: None   Collection Time: 11/25/23 10:12 AM   Specimen: Urine  Result Value Ref Range Status   MICRO NUMBER: [id number]  Final   SPECIMEN QUALITY: Adequate  Final   Sample Source URINE  Final   STATUS: FINAL  Final   Result: No Growth  Final  Resp panel by RT-PCR (RSV, Flu A&B, Covid) Anterior Nasal Swab     Status: None   Collection Time: 12/05/23  3:36 AM   Specimen: Anterior Nasal Swab  Result Value Ref Range Status   SARS Coronavirus 2 by RT PCR NEGATIVE NEGATIVE Final    Comment: (NOTE) SARS-CoV-2 target nucleic acids are NOT DETECTED.  The SARS-CoV-2 RNA is generally detectable in upper respiratory specimens during the acute phase of infection. The lowest concentration of SARS-CoV-2 viral copies this assay can detect is 138 copies/mL. A negative result does not preclude SARS-Cov-2 infection and should not be used as the sole basis for treatment or other patient management decisions. A negative result may occur with  improper specimen collection/handling, submission of specimen other than nasopharyngeal swab, presence of viral mutation(s) within the areas targeted by this assay, and inadequate number of viral copies(<138 copies/mL). A negative result must be combined with clinical observations, patient history, and epidemiological information. The expected result is Negative.  Fact Sheet for Patients:  BloggerCourse.com  Fact Sheet for Healthcare Providers:  SeriousBroker.it  This test is no t yet approved or cleared by the  [place] FDA and  has been authorized for detection and/or diagnosis of SARS-CoV-2 by FDA under an Emergency Use Authorization (EUA). This EUA will remain  in effect (meaning this test can be used) for the duration of the COVID-19 declaration under Section 564(b)(1) of the Act, 21 U.S.C.section 360bbb-3(b)(1), unless the authorization is terminated  or revoked sooner.       Influenza A by PCR NEGATIVE NEGATIVE Final   Influenza B by PCR NEGATIVE NEGATIVE Final    Comment: (NOTE) The Xpert Xpress SARS-CoV-2/FLU/RSV plus assay is intended as an aid in the diagnosis of influenza from Nasopharyngeal swab specimens and should not be used as a sole basis for treatment. Nasal washings and aspirates are unacceptable for Xpert Xpress SARS-CoV-2/FLU/RSV testing.  Fact Sheet for Patients: BloggerCourse.com  Fact Sheet for Healthcare Providers: SeriousBroker.it  This test is not yet approved or cleared by the [place] FDA and has been authorized for detection and/or diagnosis of SARS-CoV-2 by FDA under an Emergency Use Authorization (EUA). This EUA will remain in effect (meaning this test can be used) for the duration of the COVID-19 declaration under Section 564(b)(1) of the Act, 21 U.S.C. section 360bbb-3(b)(1), unless the authorization  is terminated or revoked.     Resp Syncytial Virus by PCR NEGATIVE NEGATIVE Final    Comment: (NOTE) Fact Sheet for Patients: BloggerCourse.com  Fact Sheet for Healthcare Providers: SeriousBroker.it  This test is not yet approved or cleared by the [place] FDA and has been authorized for detection and/or diagnosis of SARS-CoV-2 by FDA under an Emergency Use Authorization (EUA). This EUA will remain in effect (meaning this test can be used) for the duration of the COVID-19 declaration under Section 564(b)(1) of the Act, 21 U.S.C. section  360bbb-3(b)(1), unless the authorization is terminated or revoked.  Performed at [facility], [address] [place], [place] [address]     Labs: Results for orders placed or performed during the hospital encounter of 12/05/23 (from the past 48 hours)  Comprehensive metabolic panel     Status: Abnormal   Collection Time: 12/11/23  4:54 AM  Result Value Ref Range   Sodium 137 135 - 145 mmol/L   Potassium 4.1 3.5 - 5.1 mmol/L   Chloride 104 98 - 111 mmol/L   CO2 24 22 - 32 mmol/L   Glucose, Bld 119 (H) 70 - 99 mg/dL    Comment: Glucose reference range applies only to samples taken after fasting for at least 8 hours.   BUN 16 8 - 23 mg/dL   Creatinine, Ser 0.98 0.61 - 1.24 mg/dL   Calcium 8.6 (L) 8.9 - 10.3 mg/dL   Total Protein 6.6 6.5 - 8.1 g/dL   Albumin 2.8 (L) 3.5 - 5.0 g/dL   AST 60 (H) 15 - 41 U/L   ALT 95 (H) 0 - 44 U/L   Alkaline Phosphatase 447 (H) 38 - 126 U/L   Total Bilirubin 5.1 (H) 0.0 - 1.2 mg/dL   GFR, Estimated >11 >91 mL/min    Comment: (NOTE) Calculated using the CKD-EPI Creatinine Equation (2021)    Anion gap 9 5 - 15    Comment: Performed at [facility], [address], [place], [place] [address]  CBC     Status: Abnormal   Collection Time: 12/11/23  4:54 AM  Result Value Ref Range   WBC 16.0 (H) 4.0 - 10.5 K/uL   RBC 3.74 (L) 4.22 - 5.81 MIL/uL   Hemoglobin 13.3 13.0 - 17.0 g/dL   HCT 56.2 13.0 - 86.5 %   MCV 104.5 (H) 80.0 - 100.0 fL   MCH 35.6 (H) 26.0 - 34.0 pg   MCHC 34.0 30.0 - 36.0 g/dL   RDW 78.4 69.6 - 29.5 %   Platelets 184 150 - 400 K/uL   nRBC 0.0 0.0 - 0.2 %    Comment: Performed at [facility], [address] [place], [place] [address]  Phosphorus     Status: None   Collection Time: 12/11/23  4:54 AM  Result Value Ref Range   Phosphorus 2.9 2.5 - 4.6 mg/dL    Comment: Performed at [facility], [address] [place], [place] [address]  Magnesium      Status: None   Collection Time: 12/11/23  4:54 AM  Result Value Ref Range   Magnesium 1.8 1.7 - 2.4 mg/dL    Comment: Performed at [facility], [address] [place], [place] [address]  Vitamin B12     Status: None   Collection Time: 12/11/23  4:54 AM  Result Value Ref Range   Vitamin B-12 606 180 - 914 pg/mL    Comment: (NOTE)  This assay is not validated for testing neonatal or myeloproliferative syndrome specimens for Vitamin B12 levels. Performed at [facility], [address] [place], [place] [address]   Folate     Status: None   Collection Time: 12/11/23  4:54 AM  Result Value Ref Range   Folate 13.7 >5.9 ng/mL    Comment: Performed at [facility], [address], [place], [place] [address]  Iron and TIBC     Status: Abnormal   Collection Time: 12/11/23  4:54 AM  Result Value Ref Range   Iron 45 45 - 182 ug/dL   TIBC 098 119 - 147 ug/dL   Saturation Ratios 13 (L) 17.9 - 39.5 %   UIBC 291 ug/dL    Comment: Performed at [facility], [address] [place], [place] [address]  Ferritin     Status: None   Collection Time: 12/11/23  4:54 AM  Result Value Ref Range   Ferritin 268 24 - 336 ng/mL    Comment: Performed at [facility], [address], [place], [place] [address]  Reticulocytes     Status: Abnormal   Collection Time: 12/11/23  4:54 AM  Result Value Ref Range   Retic Ct Pct 1.1 0.4 - 3.1 %   RBC. 3.80 (L) 4.22 - 5.81 MIL/uL   Retic Count, Absolute 42.9 19.0 - 186.0 K/uL   Immature Retic Fract 13.7 2.3 - 15.9 %    Comment: Performed at [facility], [address], [place], [place] [address]    Imaging / Studies: No results found.   Medications / Allergies: per chart  Antibiotics: Anti-infectives (From admission, onward)    Start     Dose/Rate Route Frequency Ordered Stop   12/07/23 1145  ceFEPIme (MAXIPIME) 2 g in sodium chloride 0.9 %  100 mL IVPB        2 g 200 mL/hr over 30 Minutes Intravenous Every 12 hours 12/07/23 1048     12/07/23 1145  metroNIDAZOLE (FLAGYL) IVPB 500 mg        500 mg 100 mL/hr over 60 Minutes Intravenous Every 12 hours 12/07/23 1048     12/05/23 1400  piperacillin-tazobactam (ZOSYN) IVPB 3.375 g  Status:  Discontinued        3.375 g 100 mL/hr over 30 Minutes Intravenous Every 8 hours 12/05/23 1026 12/05/23 1028   12/05/23 1200  piperacillin-tazobactam (ZOSYN) IVPB 3.375 g  Status:  Discontinued        3.375 g 12.5 mL/hr over 240 Minutes Intravenous Every 8 hours 12/05/23 1028 12/07/23 1048         Note: Portions of this report may have been transcribed using voice recognition software. Every effort was made to ensure accuracy; however, inadvertent computerized transcription errors may be present.   Any transcriptional errors that result from this process are unintentional.    Ardeth Sportsman, MD, FACS, MASCRS Esophageal, Gastrointestinal & Colorectal Surgery Robotic and Minimally Invasive Surgery  [facility] [address], [address] [place], [place] [address] [phone] Fax [phone] Main  CONTACT INFORMATION: Weekday (9AM-5PM): Call [facility] main office at [phone] Weeknight (5PM-9AM) or Weekend/Holiday: Check EPIC "Web Links" tab & use "AMION" (password " [email or web address]") for General Surgery [facility] coverage  Please, DO NOT use SecureChat  (it is not reliable communication to reach operating surgeons & will lead to a delay in care).   Epic staff messaging available for  outptient concerns needing 1-2 business day response.      12/12/2023  7:57 AM

## 2023-12-12 NOTE — Discharge Summary (Signed)
 Physician Discharge Summary  OCIE TINO QIO:[id number] DOB: 1933/09/03 DOA: 12/05/2023  PCP: Shelva Majestic, MD  Admit date: 12/05/2023 Discharge date: 12/12/23  Admitted From: Home Disposition: Home Recommendations for Outpatient Follow-up:  Outpatient follow-up with PCP and general surgery as below Check CMP and CBC at follow-up Please follow up on the following pending results: None  Home Health: No need identified  Equipment/Devices: No need identified  Discharge Condition: Stable CODE STATUS: Full code  Follow-up Information     Maczis, Hedda Slade, PA-C Follow up.   Specialty: General Surgery Why: our office is scheduling you for post-operative follow up in 3-4 weeks. call to confirm appointment date/time. Contact information: [address] [place] [address] [phone]         Shelva Majestic, MD. Schedule an appointment as soon as possible for a visit in 1 week(s).   Specialty: Family Medicine Contact information: [address] [place] [address] [phone]                 Hospital course 89 year old M with PMH of colon cancer, CKD-3B, hypothyroidism, anemia and right inguinal hernia presented to ED with complaints of abdominal pain, nausea, emesis and chills, and admitted with working diagnosis of obstructive jaundice in the setting of choledocholithiasis and cholelithiasis. MRCP showed 2 mm stone in distal and common bile duct. He had elevated temperature with leukocytosis. He was started on broad-spectrum antibiotics. He underwent ERCP with stone retrieval and sphincterectomy on 2/24. However, patient's bilirubin remained elevated delaying laparoscopic cholecystectomy until 12/09/2022.   On the day of discharge, patient tolerated regular diet and has had bowel movements.  Cleared for discharge by general surgery for outpatient follow-up.  See individual problem list below for more.   Problems addressed during this  hospitalization Calculus cholecystitis with choledocholithiasis Elevated liver enzymes/hyperbilirubinemia-likely due to the above.  Improved. -S/p ERCP with stone retrieval and sphincterectomy on 12/06/2023 -S/p laparoscopic cholecystectomy on 12/10/2023 -IV Zosyn 2/23>> cefepime and Flagyl 2/25-3/2 -Tylenol and oxycodone for pain -Check CMP at follow-up -Outpatient follow-up as above     Acute kidney injury-baseline Cr ~1.0-1.1.  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  Hgb improved. -Recheck CBC at follow-up       Time spent 35 minutes  Vital signs Vitals:   12/11/23 0527 12/11/23 1221 12/11/23 2020 12/12/23 0518  BP: 133/67 (!) 150/82 117/63 (!) 146/70  Pulse: (!) 57 72 (!) 58 (!) 51  Temp: 97.9 F (36.6 C) 97.8 F (36.6 C) 98.3 F (36.8 C) 98 F (36.7 C)  Resp: 17 16 16 16   Height:      Weight:      SpO2: 95% 95% 98% 98%  TempSrc: Oral     BMI (Calculated):         Discharge exam  GENERAL: No apparent distress.  Nontoxic. HEENT: MMM.  Vision and hearing grossly intact.  NECK: Supple.  No apparent JVD.  RESP:  No IWOB.  Fair aeration bilaterally. CVS:  RRR. Heart sounds normal.  ABD/GI/GU: BS+. Abd soft, NTND.  Laparoscopic wounds DCI. MSK/EXT:  Moves extremities. No apparent deformity. No edema.  SKIN: Laparoscopic wounds DCI. NEURO: Awake and alert. Oriented appropriately.  No apparent focal neuro deficit. PSYCH: Calm. Normal affect.   Discharge Instructions  Discharge Instructions     Diet - low sodium heart healthy   Complete by: As directed    Discharge instructions   Complete by: As directed    It has been a pleasure taking care of you!  You were hospitalized due to gallstone with obstruction for  which you had clean up of your biliary duct and surgical removal of gallbladder.  Your symptoms and your liver enzymes improved.  Follow-up with your primary care doctor in 1 to 2 weeks or sooner if needed.  Follow-up with your surgeon per their recommendation.   Take care,   Increase activity slowly   Complete by: As directed       Allergies as of 12/12/2023   No Known Allergies      Medication List     PAUSE taking these medications    simvastatin 20 MG tablet Wait to take this until: December 20, 2023 Commonly known as: ZOCOR TAKE 1 TABLET BY MOUTH DAILY       STOP taking these medications    nitrofurantoin (macrocrystal-monohydrate) 100 MG capsule Commonly known as: Macrobid       TAKE these medications    acetaminophen 325 MG tablet Commonly known as: TYLENOL Take 2 tablets (650 mg total) by mouth every 6 (six) hours as needed for up to 5 days for mild pain (pain score 1-3) (or Fever >/= 101).   cyanocobalamin 1000 MCG/ML injection Commonly known as: VITAMIN B12 INJECT 1mL INTRAMUSCULARLY EVERY 30 DAYS What changed:  how much to take how to take this when to take this additional instructions   levothyroxine 75 MCG tablet Commonly known as: SYNTHROID TAKE 1 TABLET BY MOUTH DAILY   oxyCODONE 5 MG immediate release tablet Commonly known as: Oxy IR/ROXICODONE Take 1 tablet (5 mg total) by mouth every 6 (six) hours as needed for moderate pain (pain score 4-6).   Potassium Citrate 15 MEQ (1620 MG) Tbcr Take 1 tablet by mouth daily.        Consultations: Gastroenterology General surgery  Procedures/Studies: 2/24-ERCP with stone retrieval and sphincterectomy 2/28-laparoscopic cholecystectomy   MR ABDOMEN MRCP W WO CONTAST Result Date: 12/09/2023 CLINICAL DATA:  Jaundice, rising bilirubin EXAM: MRI ABDOMEN WITHOUT AND WITH CONTRAST (INCLUDING MRCP) TECHNIQUE: Multiplanar multisequence MR imaging of the abdomen was performed both before and after the  administration of intravenous contrast. Heavily T2-weighted images of the biliary and pancreatic ducts were obtained, and three-dimensional MRCP images were rendered by post processing. CONTRAST:  6mL GADAVIST GADOBUTROL 1 MMOL/ML IV SOLN COMPARISON:  12/05/2023 FINDINGS: Lower chest: Trace pleural effusions and associated atelectasis or consolidation. Hepatobiliary: No solid liver abnormality is seen. Minimal air within the gallbladder, presumably secondary to instrumentation (series 23, image 23). Tiny gallstones or gravel in the dependent gallbladder (series 23, image 22). No persistent calculi within the common bile duct. Common bile duct remains at the upper limit of normal in caliber measuring 0.7 cm (series 23, image 24). Pancreas: Unremarkable. No pancreatic ductal dilatation or surrounding inflammatory changes. Spleen: Normal in size without significant abnormality. Adrenals/Urinary Tract: Adrenal glands are unremarkable. Simple and thinly septated benign bilateral renal cortical cysts, for which no specific further follow-up or characterization is required. Kidneys are otherwise normal, without renal calculi, solid lesion, or hydronephrosis. Stomach/Bowel: Stomach is within normal limits. No evidence of bowel wall thickening, distention, or inflammatory changes. Vascular/Lymphatic: No significant vascular findings are present. No enlarged abdominal lymph nodes. Other: No abdominal wall hernia or abnormality. No ascites. Musculoskeletal: No acute or  significant osseous findings. IMPRESSION: 1. Tiny gallstones or gravel in the dependent gallbladder. Minimal air within the gallbladder, presumably secondary to instrumentation. 2. No persistent calculi within the common bile duct. Common bile duct remains at the upper limit of normal in caliber measuring 0.7 cm. 3. Trace pleural effusions and associated atelectasis or consolidation. Electronically Signed   By: Jearld Lesch M.D.   On: 12/09/2023 22:09   MR 3D  Recon At Scanner Result Date: 12/09/2023 CLINICAL DATA:  Jaundice, rising bilirubin EXAM: MRI ABDOMEN WITHOUT AND WITH CONTRAST (INCLUDING MRCP) TECHNIQUE: Multiplanar multisequence MR imaging of the abdomen was performed both before and after the administration of intravenous contrast. Heavily T2-weighted images of the biliary and pancreatic ducts were obtained, and three-dimensional MRCP images were rendered by post processing. CONTRAST:  6mL GADAVIST GADOBUTROL 1 MMOL/ML IV SOLN COMPARISON:  12/05/2023 FINDINGS: Lower chest: Trace pleural effusions and associated atelectasis or consolidation. Hepatobiliary: No solid liver abnormality is seen. Minimal air within the gallbladder, presumably secondary to instrumentation (series 23, image 23). Tiny gallstones or gravel in the dependent gallbladder (series 23, image 22). No persistent calculi within the common bile duct. Common bile duct remains at the upper limit of normal in caliber measuring 0.7 cm (series 23, image 24). Pancreas: Unremarkable. No pancreatic ductal dilatation or surrounding inflammatory changes. Spleen: Normal in size without significant abnormality. Adrenals/Urinary Tract: Adrenal glands are unremarkable. Simple and thinly septated benign bilateral renal cortical cysts, for which no specific further follow-up or characterization is required. Kidneys are otherwise normal, without renal calculi, solid lesion, or hydronephrosis. Stomach/Bowel: Stomach is within normal limits. No evidence of bowel wall thickening, distention, or inflammatory changes. Vascular/Lymphatic: No significant vascular findings are present. No enlarged abdominal lymph nodes. Other: No abdominal wall hernia or abnormality. No ascites. Musculoskeletal: No acute or significant osseous findings. IMPRESSION: 1. Tiny gallstones or gravel in the dependent gallbladder. Minimal air within the gallbladder, presumably secondary to instrumentation. 2. No persistent calculi within the  common bile duct. Common bile duct remains at the upper limit of normal in caliber measuring 0.7 cm. 3. Trace pleural effusions and associated atelectasis or consolidation. Electronically Signed   By: Jearld Lesch M.D.   On: 12/09/2023 22:09   DG ERCP Result Date: 12/07/2023 CLINICAL DATA:  Biliary colic. EXAM: ERCP TECHNIQUE: Multiple spot images obtained with the fluoroscopic device and submitted for interpretation post-procedure. FLUOROSCOPY: Radiation Exposure Index (as provided by the fluoroscopic device): 167.5 mGy Kerma COMPARISON:  MRI 12/05/2023 FINDINGS: Common bile duct and main pancreatic duct were cannulated with wires. Retrograde cholangiogram demonstrates small filling defects in the bile ducts. Cystic duct is patent. Evidence for a balloon sweep. Probable contrast within the main pancreatic duct. IMPRESSION: Small filling defects in the biliary system could represent small stones. These images were submitted for radiologic interpretation only. Electronically Signed   By: Richarda Overlie M.D.   On: 12/07/2023 11:04   DG C-Arm 1-60 Min-No Report Result Date: 12/06/2023 Fluoroscopy was utilized by the requesting physician.  No radiographic interpretation.   MR 3D Recon At Scanner Result Date: 12/06/2023 CLINICAL DATA:  Generalized abdominal pain.  Nausea and vomiting. EXAM: MRI ABDOMEN WITHOUT AND WITH CONTRAST (INCLUDING MRCP) TECHNIQUE: Multiplanar multisequence MR imaging of the abdomen was performed both before and after the administration of intravenous contrast. Heavily T2-weighted images of the biliary and pancreatic ducts were obtained, and three-dimensional MRCP images were rendered by post processing. CONTRAST:  6mL GADAVIST GADOBUTROL 1 MMOL/ML IV SOLN COMPARISON:  Abdomen and pelvis  CT earlier same day. Ultrasound exam earlier same day. Chest abdomen pelvis CT 04/06/2023. FINDINGS: Lower chest: Subsegmental atelectasis noted left lung base. Hepatobiliary: Diffuse loss of signal  intensity in the liver parenchyma on out of phase T1 imaging is compatible with fatty deposition. No suspicious focal abnormality within the liver parenchyma. Gallbladder wall thickness upper normal at 3 mm with increased signal in the wall the gallbladder suggesting edema. MRCP imaging is markedly motion degraded. Within this limitation, tiny stone identified in the neck of the gallbladder (coronal MRCP image 23 of series 10 and axial T2 image 22 of series 4). Tiny 2 mm stone identified in the distal common bile duct (coronal MRCP image 23/10 and axial T2 haste image 24/26 and axial T2 image 25/4). No substantial intrahepatic biliary duct dilatation. Common bile duct measures 6 mm in the head of the pancreas. Pancreas: No focal mass lesion. No dilatation of the main duct. No intraparenchymal cyst. No peripancreatic edema. Spleen:  No splenomegaly. No suspicious focal mass lesion. Adrenals/Urinary Tract: No adrenal nodule or mass. Bilateral simple renal cysts noted measuring up to 6.8 cm in the right kidney and 5.3 cm in the left kidney. Additional tiny T2 hyperintensities in both kidneys show no definite enhancement but are technically too small to characterize. Likely benign. No followup imaging is recommended. Stomach/Bowel: Stomach is moderately distended with food. Duodenum is normally positioned as is the ligament of Treitz. No small bowel or colonic dilatation within the visualized abdomen. Vascular/Lymphatic: No abdominal aortic aneurysm. No abdominal lymphadenopathy Other:  No intraperitoneal free fluid. Musculoskeletal: No focal suspicious marrow enhancement within the visualized bony anatomy. IMPRESSION: 1. Tiny 2 mm stone in the distal common bile duct. No substantial intrahepatic biliary duct dilatation. Common bile duct measures 6 mm in the head of the pancreas. 2. Tiny stone in the neck of the gallbladder. Gallbladder wall thickness upper normal at 3 mm with increased signal in the wall the  gallbladder suggesting edema. Imaging features raise concern for acute cholecystitis. 3. Hepatic steatosis. 4. Bilateral simple renal cysts. Electronically Signed   By: Kennith Center M.D.   On: 12/06/2023 08:16   MR ABDOMEN MRCP W WO CONTAST Result Date: 12/05/2023 CLINICAL DATA:  Generalized abdominal pain.  Nausea and vomiting. EXAM: MRI ABDOMEN WITHOUT AND WITH CONTRAST (INCLUDING MRCP) TECHNIQUE: Multiplanar multisequence MR imaging of the abdomen was performed both before and after the administration of intravenous contrast. Heavily T2-weighted images of the biliary and pancreatic ducts were obtained, and three-dimensional MRCP images were rendered by post processing. CONTRAST:  6mL GADAVIST GADOBUTROL 1 MMOL/ML IV SOLN COMPARISON:  Abdomen and pelvis CT earlier same day. Ultrasound exam earlier same day. Chest abdomen pelvis CT 04/06/2023. FINDINGS: Lower chest: Subsegmental atelectasis noted left lung base. Hepatobiliary: Diffuse loss of signal intensity in the liver parenchyma on out of phase T1 imaging is compatible with fatty deposition. No suspicious focal abnormality within the liver parenchyma. Gallbladder wall thickness upper normal at 3 mm with increased signal in the wall the gallbladder suggesting edema. MRCP imaging is markedly motion degraded. Within this limitation, tiny stone identified in the neck of the gallbladder (coronal MRCP image 23 of series 10 and axial T2 image 22 of series 4). Tiny 2 mm stone identified in the distal common bile duct (coronal MRCP image 23/10 and axial T2 haste image 24/26 and axial T2 image 25/4). No substantial intrahepatic biliary duct dilatation. Common bile duct measures 6 mm in the head of the pancreas. Pancreas: No focal mass  lesion. No dilatation of the main duct. No intraparenchymal cyst. No peripancreatic edema. Spleen:  No splenomegaly. No suspicious focal mass lesion. Adrenals/Urinary Tract: No adrenal nodule or mass. Bilateral simple renal cysts noted  measuring up to 6.8 cm in the right kidney and 5.3 cm in the left kidney. Additional tiny T2 hyperintensities in both kidneys show no definite enhancement but are technically too small to characterize. Likely benign. No followup imaging is recommended. Stomach/Bowel: Stomach is moderately distended with food. Duodenum is normally positioned as is the ligament of Treitz. No small bowel or colonic dilatation within the visualized abdomen. Vascular/Lymphatic: No abdominal aortic aneurysm. No abdominal lymphadenopathy Other:  No intraperitoneal free fluid. Musculoskeletal: No focal suspicious marrow enhancement within the visualized bony anatomy. IMPRESSION: 1. Tiny 2 mm stone in the distal common bile duct. No substantial intrahepatic biliary duct dilatation. Common bile duct measures 6 mm in the head of the pancreas. 2. Tiny stone in the neck of the gallbladder. Gallbladder wall thickness upper normal at 3 mm with increased signal in the wall the gallbladder suggesting edema. Imaging features raise concern for acute cholecystitis. 3. Hepatic steatosis. 4. Bilateral simple renal cysts. Electronically Signed   By: Kennith Center M.D.   On: 12/05/2023 09:31   CT ABDOMEN PELVIS W CONTRAST Result Date: 12/05/2023 CLINICAL DATA:  89 year old male status post right hemicolectomy for colon cancer on 04/08/2023. Abdominal pain which woke him at 0230 hours. EXAM: CT ABDOMEN AND PELVIS WITH CONTRAST TECHNIQUE: Multidetector CT imaging of the abdomen and pelvis was performed using the standard protocol following bolus administration of intravenous contrast. RADIATION DOSE REDUCTION: This exam was performed according to the departmental dose-optimization program which includes automated exposure control, adjustment of the mA and/or kV according to patient size and/or use of iterative reconstruction technique. CONTRAST:  OMNIPAQUE IOHEXOL 300 MG/ML  SOLN COMPARISON:  Preoperative CT Abdomen and Pelvis 04/06/2023 FINDINGS:  Lower chest: Heart size remains normal. Calcified coronary artery atherosclerosis. No pericardial or pleural effusion. Chronic lung base atelectasis and scarring, progressive enhancing left lung base atelectasis since June. No lung base nodule. Hepatobiliary: Stable and negative for age CT appearance of the liver and gallbladder. Pancreas: Negative. Spleen: Negative. Adrenals/Urinary Tract: Normal adrenal glands. Chronic simple and benign appearing renal cysts (no follow up imaging recommended). Nonobstructed kidneys with symmetric renal enhancement and contrast excretion. Multifocal right nephrolithiasis. No obstructive uropathy. No left-side renal calculus. Decompressed ureters and bladder. Stomach/Bowel: Interval right hemicolectomy. Redundant splenic flexure and sigmoid colon. Small to large bowel anastomosis on coronal image 32 with no adverse features. No dilated loops. Increased small bowel containing left inguinal hernia since June (coronal image 44), but no evidence of incarceration or inflammation there. Other postoperative changes to the ventral abdominal wall with no adverse features. Retained fluid in the stomach. Duodenum is decompressed. No free air, free fluid, or mesenteric inflammation identified. Vascular/Lymphatic: Extensive Aortoiliac calcified atherosclerosis. Major arterial structures in the abdomen and pelvis remain patent. Tortuous aortoiliac vessels with no abdominal aortic aneurysm. Portal venous system appears to be patent. No lymphadenopathy is identified. Reproductive: Bilateral fat containing umbilical hernias. Increased herniation of small bowel loops on the left since June (coronal images 44 and 45). Other: No pelvis free fluid. Musculoskeletal: Stable visualized osseous structures. No acute or suspicious osseous lesion identified. IMPRESSION: 1. Interval Right hemicolectomy with no adverse features. No metastatic disease is identified. 2. Chronic small inguinal hernias, that on the  left now include so more small-bowel since June, but no evidence of  hernia incarceration. No evidence of obstruction. 3. Acute on chronic lung base atelectasis, scarring. Aortic Atherosclerosis (ICD10-I70.0). Electronically Signed   By: Odessa Fleming M.D.   On: 12/05/2023 07:12   US Abdomen Limited RUQ (LIVER/GB) Result Date: 12/05/2023 CLINICAL DATA:  Elevated LFTs. EXAM: ULTRASOUND ABDOMEN LIMITED RIGHT UPPER QUADRANT COMPARISON:  CT with IV and oral contrast 04/06/2023. FINDINGS: Gallbladder: No gallstones or wall thickening visualized. No sonographic Murphy sign noted by sonographer. Common bile duct: Diameter: 2.7 mm.  No intrahepatic biliary dilatation. Liver: No focal lesion identified. The liver is diffusely echogenic consistent with steatosis. Portal vein is patent on color Doppler imaging with normal direction of blood flow towards the liver. Other: 6.4 cm simple cyst again noted upper pole right kidney. IMPRESSION: 1. No evidence of gallstones, wall thickening or biliary dilatation. 2. Hepatic steatosis. 3. 6.4 cm simple cyst upper pole right kidney. Electronically Signed   By: Almira Bar M.D.   On: 12/05/2023 05:49       The results of significant diagnostics from this hospitalization (including imaging, microbiology, ancillary and laboratory) are listed below for reference.     Microbiology: Recent Results (from the past 240 hours)  Resp panel by RT-PCR (RSV, Flu A&B, Covid) Anterior Nasal Swab     Status: None   Collection Time: 12/05/23  3:36 AM   Specimen: Anterior Nasal Swab  Result Value Ref Range Status   SARS Coronavirus 2 by RT PCR NEGATIVE NEGATIVE Final    Comment: (NOTE) SARS-CoV-2 target nucleic acids are NOT DETECTED.  The SARS-CoV-2 RNA is generally detectable in upper respiratory specimens during the acute phase of infection. The lowest concentration of SARS-CoV-2 viral copies this assay can detect is 138 copies/mL. A negative result does not preclude  SARS-Cov-2 infection and should not be used as the sole basis for treatment or other patient management decisions. A negative result may occur with  improper specimen collection/handling, submission of specimen other than nasopharyngeal swab, presence of viral mutation(s) within the areas targeted by this assay, and inadequate number of viral copies(<138 copies/mL). A negative result must be combined with clinical observations, patient history, and epidemiological information. The expected result is Negative.  Fact Sheet for Patients:  BloggerCourse.com  Fact Sheet for Healthcare Providers:  SeriousBroker.it  This test is no t yet approved or cleared by the [place] FDA and  has been authorized for detection and/or diagnosis of SARS-CoV-2 by FDA under an Emergency Use Authorization (EUA). This EUA will remain  in effect (meaning this test can be used) for the duration of the COVID-19 declaration under Section 564(b)(1) of the Act, 21 U.S.C.section 360bbb-3(b)(1), unless the authorization is terminated  or revoked sooner.       Influenza A by PCR NEGATIVE NEGATIVE Final   Influenza B by PCR NEGATIVE NEGATIVE Final    Comment: (NOTE) The Xpert Xpress SARS-CoV-2/FLU/RSV plus assay is intended as an aid in the diagnosis of influenza from Nasopharyngeal swab specimens and should not be used as a sole basis for treatment. Nasal washings and aspirates are unacceptable for Xpert Xpress SARS-CoV-2/FLU/RSV testing.  Fact Sheet for Patients: BloggerCourse.com  Fact Sheet for Healthcare Providers: SeriousBroker.it  This test is not yet approved or cleared by the [place] FDA and has been authorized for detection and/or diagnosis of SARS-CoV-2 by FDA under an Emergency Use Authorization (EUA). This EUA will remain in effect (meaning this test can be used) for the duration of  the COVID-19 declaration under Section 564(b)(1) of the  Act, 21 U.S.C. section 360bbb-3(b)(1), unless the authorization is terminated or revoked.     Resp Syncytial Virus by PCR NEGATIVE NEGATIVE Final    Comment: (NOTE) Fact Sheet for Patients: BloggerCourse.com  Fact Sheet for Healthcare Providers: SeriousBroker.it  This test is not yet approved or cleared by the [place] FDA and has been authorized for detection and/or diagnosis of SARS-CoV-2 by FDA under an Emergency Use Authorization (EUA). This EUA will remain in effect (meaning this test can be used) for the duration of the COVID-19 declaration under Section 564(b)(1) of the Act, 21 U.S.C. section 360bbb-3(b)(1), unless the authorization is terminated or revoked.  Performed at [facility], [address], [place], [place] [address]      Labs:  CBC: Recent Labs  Lab 12/06/23 [phone] 12/07/23 0421 12/08/23 0444 12/11/23 0454 12/12/23 0859  WBC 12.9* 9.1 6.9 16.0* 10.5  HGB 11.7* 11.9* 12.0* 13.3 11.5*  HCT 35.1* 35.0* 36.6* 39.1 36.2*  MCV 110.0* 107.0* 107.6* 104.5* 110.0*  PLT 152 136* 144* 184 150   BMP &GFR Recent Labs  Lab 12/08/23 0444 12/09/23 0834 12/10/23 0414 12/11/23 0454 12/12/23 0859  NA 137 135 137 137 137  K 4.2 4.1 4.1 4.1 4.3  CL 105 102 104 104 104  CO2 25 26 26 24 24   GLUCOSE 95 97 96 119* 144*  BUN 14 13 15 16 15   CREATININE 1.28* 1.02 1.13 1.11 1.04  CALCIUM 8.2* 8.5* 8.4* 8.6* 8.5*  MG  --   --   --  1.8 1.9  PHOS  --   --   --  2.9  --    Estimated Creatinine Clearance: 45.3 mL/min (by C-G formula based on SCr of 1.04 mg/dL). Liver & Pancreas: Recent Labs  Lab 12/08/23 0444 12/09/23 0834 12/10/23 0414 12/11/23 0454 12/12/23 0859  AST 107* 116* 68* 60* 38  ALT 146* 150* 112* 95* 69*  ALKPHOS 327* 496* 469* 447* 314*  BILITOT 8.1* 11.3* 7.3* 5.1* 3.5*  PROT 5.5* 6.5 5.9* 6.6 6.0*  ALBUMIN 2.5*  2.8* 2.6* 2.8* 2.8*   Recent Labs  Lab 12/06/23 0409  LIPASE 29   No results for input(s): "AMMONIA" in the last 168 hours. Diabetic: No results for input(s): "HGBA1C" in the last 72 hours. No results for input(s): "GLUCAP" in the last 168 hours. Cardiac Enzymes: No results for input(s): "CKTOTAL", "CKMB", "CKMBINDEX", "TROPONINI" in the last 168 hours. No results for input(s): "PROBNP" in the last 8760 hours. Coagulation Profile: No results for input(s): "INR", "PROTIME" in the last 168 hours. Thyroid Function Tests: No results for input(s): "TSH", "T4TOTAL", "FREET4", "T3FREE", "THYROIDAB" in the last 72 hours. Lipid Profile: No results for input(s): "CHOL", "HDL", "LDLCALC", "TRIG", "CHOLHDL", "LDLDIRECT" in the last 72 hours. Anemia Panel: Recent Labs    12/11/23 0454  VITAMINB12 606  FOLATE 13.7  FERRITIN 268  TIBC 336  IRON 45  RETICCTPCT 1.1   Urine analysis:    Component Value Date/Time   COLORURINE YELLOW 12/05/2023 0335   APPEARANCEUR CLEAR 12/05/2023 0335   LABSPEC 1.017 12/05/2023 0335   PHURINE 6.0 12/05/2023 0335   GLUCOSEU NEGATIVE 12/05/2023 0335   GLUCOSEU >=1000 (A) 11/25/2023 1027   HGBUR SMALL (A) 12/05/2023 0335   HGBUR negative 09/24/2010 1004   BILIRUBINUR NEGATIVE 12/05/2023 0335   BILIRUBINUR positive 11/25/2023 0936   KETONESUR NEGATIVE 12/05/2023 0335   PROTEINUR NEGATIVE 12/05/2023 0335   UROBILINOGEN 0.2 11/25/2023 1027   UROBILINOGEN 2.0 (A) 11/25/2023 2956  NITRITE NEGATIVE 12/05/2023 0335   LEUKOCYTESUR NEGATIVE 12/05/2023 0335   LEUKOCYTESUR Negative 01/24/2018 0000   Sepsis Labs: Invalid input(s): "PROCALCITONIN", "LACTICIDVEN"   SIGNED:  Almon Hercules, MD  [facility] 12/12/2023, 3:28 PM

## 2023-12-13 LAB — SURGICAL PATHOLOGY

## 2023-12-20 ENCOUNTER — Telehealth: Payer: Self-pay

## 2023-12-20 NOTE — Telephone Encounter (Signed)
 See below.  Copied from CRM [id number]. Topic: Clinical - Request for Lab/Test Order >> Dec 20, 2023  9:00 AM Almira Coaster wrote: Reason for CRM: [facility] is calling because they received an ultrasound abdomen order but while reviewing patient's chart they noticed he had one done is [facility] [place] [facility] on 12/05/2023. They would like to confirm if Dr.Hunter wants it repeated or if it doesn't need to be done. Their  best call back number is [phone] option 1,then option 5

## 2023-12-20 NOTE — Telephone Encounter (Signed)
 No can cancel the one ordered outpatient since he had hospital visit and multiple tests done including needed test

## 2023-12-21 ENCOUNTER — Encounter: Payer: Self-pay | Admitting: Family Medicine

## 2023-12-21 ENCOUNTER — Ambulatory Visit: Payer: Self-pay | Admitting: Family Medicine

## 2023-12-21 ENCOUNTER — Ambulatory Visit (INDEPENDENT_AMBULATORY_CARE_PROVIDER_SITE_OTHER): Admitting: Family Medicine

## 2023-12-21 ENCOUNTER — Telehealth: Payer: Self-pay | Admitting: Family Medicine

## 2023-12-21 VITALS — BP 100/64 | HR 83 | Ht 68.0 in | Wt 138.2 lb

## 2023-12-21 DIAGNOSIS — R197 Diarrhea, unspecified: Secondary | ICD-10-CM | POA: Diagnosis not present

## 2023-12-21 DIAGNOSIS — R112 Nausea with vomiting, unspecified: Secondary | ICD-10-CM

## 2023-12-21 DIAGNOSIS — Z9049 Acquired absence of other specified parts of digestive tract: Secondary | ICD-10-CM | POA: Diagnosis not present

## 2023-12-21 NOTE — Progress Notes (Signed)
 Phone [phone] In person visit   Subjective:   Chad Fisher is a 89 y.o. year old very pleasant male patient who presents for/with See problem oriented charting Chief Complaint  Patient presents with   Emesis    Pt c/o vomiting and diarrhea since last night.    Past Medical History-  Patient Active Problem List   Diagnosis Date Noted   CKD (chronic kidney disease), stage III (HCC) 11/20/2019    Priority: Medium    History of kidney stones 02/03/2016    Priority: Medium    Hypothyroidism 08/02/2013    Priority: Medium    Pernicious anemia 08/02/2013    Priority: Medium    Hyperlipidemia 12/20/2007    Priority: Medium    Personal history of urinary calculi 11/11/2022    Priority: Low   Senile purpura (HCC) 11/17/2017    Priority: Low   SKIN CANCER, HX OF 05/04/2007    Priority: Low   Cirrhosis of liver (HCC) 12/11/2023   Calculus of gallbladder with acute cholecystitis and obstruction 12/11/2023   Elevated LFTs 12/06/2023   Hyperbilirubinemia 12/05/2023   Macrocytosis 12/05/2023   Choledocholithiasis 12/05/2023   Chronic calculous cholecystitis s/p lap cholecysetomy 12/10/2023 12/05/2023   RUQ abdominal pain 12/05/2023   Malnutrition of moderate degree 04/09/2023   Adenocarcinoma of colon (HCC) 04/07/2023   Abnormal CT of the abdomen 04/07/2023   Mass of colon 04/06/2023   Normocytic anemia 04/06/2023   Hematochezia 04/04/2023    Medications- reviewed and updated Current Outpatient Medications  Medication Sig Dispense Refill   cyanocobalamin (VITAMIN B12) 1000 MCG/ML injection INJECT 1mL INTRAMUSCULARLY EVERY 30 DAYS (Patient taking differently: Inject 1,000 mcg into the muscle every 30 (thirty) days.) 10 mL 3   levothyroxine (SYNTHROID) 75 MCG tablet TAKE 1 TABLET BY MOUTH DAILY 100 tablet 2   Potassium Citrate 15 MEQ (1620 MG) TBCR Take 1 tablet by mouth daily.     simvastatin (ZOCOR) 20 MG tablet TAKE 1 TABLET BY MOUTH DAILY 100 tablet 2   No current  facility-administered medications for this visit.     Objective:  BP 100/64   Pulse 83   Ht 5\' 8"  (1.727 m)   Wt 138 lb 3.2 oz (62.7 kg)   SpO2 96%   BMI 21.01 kg/m  Gen: NAD, resting comfortably Mucous membranes are dry.  Tongue very dry CV: RRR no murmurs rubs or gallops Lungs: CTAB no crackles, wheeze, rhonchi Abdomen: soft/nontender/nondistended/normal bowel sounds. No rebound or guarding.  Ext: no edema Skin: warm, dry but skin tenting noted on the hands suggesting dehydration Neuro: Walks slowly with cane    Assessment and Plan    # Nausea/diarrhea/vomiting S:patient with laparoscopic cholecystectomy 12/09/22 due to obstructive jaundice due to choledocholithiasis and cholelithiasis not responsive to ERCP with stone retrieval and sphincterectomy on 12/06/23. He was finally discharged on 12/12/23 -he was treated with zosyn initially but later changed to cefepime and flagyl and completed course on on 12/12/23 for calculus cholecystitis -had AKI but resolved -statin held for a week   Started last night with diarrhea and vomiting. Has had 5-6 episodes of diarrhea and and about 5-6 of vomiting- last episode 2-3 am. Had some sw[place] meatballs yesterday- wife did ok but it bothered him.  Did have some chills last night - had been careful with foods up to that point- had bene doing liquid diet. Was given Zofran Oral Dissolving Tablet (ODT) at 4 mg. Had not had issues like that since being home Sunday before last.  No abdominal pain. No recnet sick contacts with stomach bug. Some apple sauce today. Ok to do bananas, rice, toast.  A/P: Patient with nausea/diarrhea/vomiting starting yesterday after having [place] meatballs after recent cholecystectomy with symptom potentially related to food but also could be viral gastroenteritis.  Fortunately has not had symptoms since 2 or 3 AM of diarrhea or vomiting and was able to connect with Dr. Carolynne Edouard who did his surgery who recommended could do brat type  diet which she has started this afternoon and he has had some liquids but limited.  Still appears dehydrated encourage pushing fluids at least 60 ounces over 24 hours but ideally more to help rehydrate - He was already scheduled to see me on Thursday morning we opted to keep this visit to reassess - Our lab was closed at the time of our visit but offered to have him come back for labs tomorrow he prefers to hold off until Thursday -I am okay with Imodium for diarrhea or Zofran which she has available now for nausea or vomiting - Diarrhea also more likely due to right colectomy history (wife is not sick yet and had same [place] meatballs but has never had colectomy and did have recent cholecystectomy)   Recommended follow up: Return for next already scheduled visit or sooner if needed. Future Appointments  Date Time Provider Department Center  12/23/2023  8:00 AM Shelva Majestic, MD [facility]  01/03/2024  3:00 PM Clerance Lav, DPM [facility]  03/20/2024 10:00 AM [facility] PHLEBOTOMIST [facility] None  03/20/2024 10:20 AM Ladene Artist, MD [facility] None    Lab/Order associations:   ICD-10-CM   1. Nausea and vomiting, unspecified vomiting type  R11.2     2. Diarrhea, unspecified type  R19.7     3. History of cholecystectomy  Z90.49       Time Spent: 28 minutes of total time (4:42 PM-5:10 PM) was spent on the date of the encounter performing the following actions: chart review prior to seeing the patient including reviewing recent discharge summary briefly, obtaining history, performing a medically necessary exam, counseling on the treatment plan particularly pushing fluids and only gradually advancing diet, and documenting in our EHR.    Return precautions advised.  Tana Conch, MD

## 2023-12-21 NOTE — Telephone Encounter (Signed)
 Pt is scheduled already for Thursday. Do you want to see him sooner or should he contact the surgeon?

## 2023-12-21 NOTE — Telephone Encounter (Signed)
 We could work him in at 420 but the issue is we would likely not have lab available and may have to come back another day for that but I can see him and evaluate him and see what labs he might need or evaluate if dehydration is significant enough that he needs to go to the hospital.  I could also talk to him about medicine that may help with nausea

## 2023-12-21 NOTE — Telephone Encounter (Signed)
 RN called - Patient's wife states patient had gallbladder surgery 2 weeks ago. He's been vomiting recently and now he's scared to drink anything for fear of vomiting again. Wife is concerned why he would be doing this after surgery and mentioned that Dr. Durene Cal would see him no matter what. It does not look like he has any openings today which is why I'm sending this to you guys.

## 2023-12-21 NOTE — Telephone Encounter (Signed)
 See below

## 2023-12-21 NOTE — Telephone Encounter (Signed)
 Previous message sent to Dr. Durene Cal already.

## 2023-12-21 NOTE — Telephone Encounter (Signed)
 I reached out to the number and options below and never got connected with anyone, will try again later.

## 2023-12-21 NOTE — Patient Instructions (Addendum)
-  I am okay with Imodium for diarrhea or Zofran which she has available now for nausea or vomiting  Recommended follow up: Return for next already scheduled visit or sooner if needed.

## 2023-12-21 NOTE — Telephone Encounter (Signed)
  Chief Complaint: vomiting Symptoms: tongue dry  Frequency: last night  Pertinent Negatives: Patient denies nausea and abdominal pain Disposition: [] ED /[] Urgent Care (no appt availability in office) / [] Appointment(In office/virtual)/ []  [place] [place] [facility]/ [] Home Care/ [] Refused Recommended Disposition /[] [place] [facility]/ [x]  Follow-up with PCP Additional Notes: called off toi see if can work pt in per pt's wife "Dr Durene Cal stated he could see [place] anytime " Was advised by Cassie she  will send message to clinical team and CMA will call back. Advised pt's wife of the same, Advised needs to notify Dr Carolynne Edouard and call transferred to his office. Copied from CRM [id number]. Topic: Clinical - Red Word Triage >> Dec 21, 2023  8:37 AM Gurney Maxin H wrote: Red Word that prompted transfer to Nurse Triage: Vomiting and diarrhea gallstones and gallbladder surgery on 2/28 Answer Assessment - Initial Assessment Questions 1. VOMITING SEVERITY: "How many times have you vomited in the past 24 hours?"     - MILD:  1 - 2 times/day    - MODERATE: 3 - 5 times/day, decreased oral intake without significant weight loss or symptoms of dehydration    - SEVERE: 6 or more times/day, vomits everything or nearly everything, with significant weight loss, symptoms of dehydration      severe 2. ONSET: "When did the vomiting begin?"     Last night  3. FLUIDS: "What fluids or food have you vomited up today?" "Have you been able to keep any fluids down?"     Hasn't tried any fluids down  4. ABDOMEN PAIN: "Are your having any abdomen pain?" If Yes : "How bad is it and what does it feel like?" (e.g., crampy, dull, intermittent, constant)      none 5. DIARRHEA: "Is there any diarrhea?" If Yes, ask: "How many times today?"      Yes - 6 times watery  6. CONTACTS: "Is there anyone else in the family with the same symptoms?"      no 7. CAUSE: "What do you think is causing your vomiting?"     unknown 8. HYDRATION STATUS: "Any  signs of dehydration?" (e.g., dry mouth [not only dry lips], too weak to stand) "When did you last urinate?"     Dehydrated -thios am  9. OTHER SYMPTOMS: "Do you have any other symptoms?" (e.g., fever, headache, vertigo, vomiting blood or coffee grounds, recent head injury)     pale 10. PREGNANCY: "Is there any chance you are pregnant?" "When was your last menstrual period?"       N/a  Protocols used: Vomiting-A-AH

## 2023-12-23 ENCOUNTER — Ambulatory Visit (INDEPENDENT_AMBULATORY_CARE_PROVIDER_SITE_OTHER): Payer: Medicare Other | Admitting: Family Medicine

## 2023-12-23 ENCOUNTER — Encounter: Payer: Self-pay | Admitting: Family Medicine

## 2023-12-23 VITALS — BP 110/72 | HR 66 | Temp 97.0°F | Ht 68.0 in | Wt 140.6 lb

## 2023-12-23 DIAGNOSIS — Z0000 Encounter for general adult medical examination without abnormal findings: Secondary | ICD-10-CM | POA: Diagnosis not present

## 2023-12-23 DIAGNOSIS — D510 Vitamin B12 deficiency anemia due to intrinsic factor deficiency: Secondary | ICD-10-CM | POA: Diagnosis not present

## 2023-12-23 DIAGNOSIS — N1832 Chronic kidney disease, stage 3b: Secondary | ICD-10-CM

## 2023-12-23 DIAGNOSIS — E782 Mixed hyperlipidemia: Secondary | ICD-10-CM

## 2023-12-23 DIAGNOSIS — E039 Hypothyroidism, unspecified: Secondary | ICD-10-CM

## 2023-12-23 LAB — CBC WITH DIFFERENTIAL/PLATELET
Basophils Absolute: 0 10*3/uL (ref 0.0–0.1)
Basophils Relative: 0.6 % (ref 0.0–3.0)
Eosinophils Absolute: 0.1 10*3/uL (ref 0.0–0.7)
Eosinophils Relative: 2.6 % (ref 0.0–5.0)
HCT: 40.3 % (ref 39.0–52.0)
Hemoglobin: 13.5 g/dL (ref 13.0–17.0)
Lymphocytes Relative: 33.3 % (ref 12.0–46.0)
Lymphs Abs: 1.5 10*3/uL (ref 0.7–4.0)
MCHC: 33.4 g/dL (ref 30.0–36.0)
MCV: 105.4 fl — ABNORMAL HIGH (ref 78.0–100.0)
Monocytes Absolute: 0.6 10*3/uL (ref 0.1–1.0)
Monocytes Relative: 14.4 % — ABNORMAL HIGH (ref 3.0–12.0)
Neutro Abs: 2.2 10*3/uL (ref 1.4–7.7)
Neutrophils Relative %: 49.1 % (ref 43.0–77.0)
Platelets: 274 10*3/uL (ref 150.0–400.0)
RBC: 3.83 Mil/uL — ABNORMAL LOW (ref 4.22–5.81)
RDW: 15.2 % (ref 11.5–15.5)
WBC: 4.5 10*3/uL (ref 4.0–10.5)

## 2023-12-23 LAB — COMPREHENSIVE METABOLIC PANEL
ALT: 47 U/L (ref 0–53)
AST: 45 U/L — ABNORMAL HIGH (ref 0–37)
Albumin: 3.7 g/dL (ref 3.5–5.2)
Alkaline Phosphatase: 200 U/L — ABNORMAL HIGH (ref 39–117)
BUN: 27 mg/dL — ABNORMAL HIGH (ref 6–23)
CO2: 30 meq/L (ref 19–32)
Calcium: 9.3 mg/dL (ref 8.4–10.5)
Chloride: 100 meq/L (ref 96–112)
Creatinine, Ser: 1.23 mg/dL (ref 0.40–1.50)
GFR: 52.41 mL/min — ABNORMAL LOW (ref >60.00)
Glucose, Bld: 98 mg/dL (ref 70–99)
Potassium: 5.3 meq/L — ABNORMAL HIGH (ref 3.5–5.1)
Sodium: 137 meq/L (ref 135–145)
Total Bilirubin: 1.9 mg/dL — ABNORMAL HIGH (ref 0.2–1.2)
Total Protein: 7.2 g/dL (ref 6.0–8.3)

## 2023-12-23 LAB — LIPID PANEL
Cholesterol: 163 mg/dL (ref 0–200)
HDL: 36.4 mg/dL — ABNORMAL LOW (ref >39.00)
LDL Cholesterol: 101 mg/dL — ABNORMAL HIGH (ref 0–99)
NonHDL: 126.78
Total CHOL/HDL Ratio: 4
Triglycerides: 129 mg/dL (ref 0.0–149.0)
VLDL: 25.8 mg/dL (ref 0.0–40.0)

## 2023-12-23 LAB — TSH: TSH: 44.62 u[IU]/mL — ABNORMAL HIGH (ref 0.35–5.50)

## 2023-12-23 NOTE — Patient Instructions (Addendum)
 Try imodium -if still having issues next week let us know  Consider Tetanus, Diphtheria, and Pertussis (Tdap) once you heal up- they only cover that at the pharmacy- let me know when you get it  Please stop by lab before you go If you have mychart- we will send your results within 3 business days of [place] receiving them.  If you do not have mychart- we will call you about results within 5 business days of [place] receiving them.  *please also note that you will see labs on mychart as soon as they post. I will later go in and write notes on them- will say "notes from Dr. Durene Cal"   Recommended follow up: Return in about 1 year (around 12/22/2024) for physical or sooner if needed.Schedule b4 you leave.

## 2023-12-23 NOTE — Progress Notes (Signed)
 Phone: [phone]   Subjective:  Patient presents today for their annual physical. Chief complaint-noted.   See problem oriented charting- ROS- full  review of systems was completed and negative  except for: diarrhea but no blood in it and no melena, icterus improving  The following were reviewed and entered/updated in epic: Past Medical History:  Diagnosis Date   Cancer (HCC)    skin   Hernia 1987   right inguinal    Patient Active Problem List   Diagnosis Date Noted   Adenocarcinoma of colon (HCC) 04/07/2023    Priority: High   Chronic calculous cholecystitis s/p lap cholecysetomy 12/10/2023 12/05/2023    Priority: Medium    CKD (chronic kidney disease), stage III (HCC) 11/20/2019    Priority: Medium    History of kidney stones 02/03/2016    Priority: Medium    Hypothyroidism 08/02/2013    Priority: Medium    Pernicious anemia 08/02/2013    Priority: Medium    Hyperlipidemia 12/20/2007    Priority: Medium    Hyperbilirubinemia 12/05/2023    Priority: Low   Macrocytosis 12/05/2023    Priority: Low   Personal history of urinary calculi 11/11/2022    Priority: Low   SKIN CANCER, HX OF 05/04/2007    Priority: Low   Cirrhosis of liver (HCC) 12/11/2023   Normocytic anemia 04/06/2023   Past Surgical History:  Procedure Laterality Date   BIOPSY  04/06/2023   Procedure: BIOPSY;  Surgeon: Hilarie Fredrickson, MD;  Location: [facility];  Service: Gastroenterology;;   CATARACT EXTRACTION, BILATERAL     CHOLECYSTECTOMY N/A 12/10/2023   Procedure: LAPAROSCOPIC CHOLECYSTECTOMY;  Surgeon: Griselda Miner, MD;  Location: [facility];  Service: General;  Laterality: N/A;   COLONOSCOPY N/A 04/06/2023   Procedure: COLONOSCOPY;  Surgeon: Hilarie Fredrickson, MD;  Location: [facility];  Service: Gastroenterology;  Laterality: N/A;   ERCP N/A 12/06/2023   Procedure: ENDOSCOPIC RETROGRADE CHOLANGIOPANCREATOGRAPHY (ERCP);  Surgeon: Hilarie Fredrickson, MD;  Location: [facility];  Service:  Gastroenterology;  Laterality: N/A;   HERNIA REPAIR     LAPAROTOMY N/A 04/08/2023   Procedure: EXPLORATORY LAPAROTOMY WITH RIGHT HEMICOLECTOMY;  Surgeon: Diamantina Monks, MD;  Location: [facility];  Service: General;  Laterality: N/A;   MOHS SURGERY     left ear   POLYPECTOMY  04/06/2023   Procedure: POLYPECTOMY;  Surgeon: Hilarie Fredrickson, MD;  Location: [facility];  Service: Gastroenterology;;   REMOVAL OF STONES  12/06/2023   Procedure: REMOVAL OF STONES;  Surgeon: Hilarie Fredrickson, MD;  Location: [facility];  Service: Gastroenterology;;   Dennison Mascot  12/06/2023   Procedure: Dennison Mascot;  Surgeon: Hilarie Fredrickson, MD;  Location: [facility];  Service: Gastroenterology;;   SUBMUCOSAL TATTOO INJECTION  04/06/2023   Procedure: SUBMUCOSAL TATTOO INJECTION;  Surgeon: Hilarie Fredrickson, MD;  Location: [facility];  Service: Gastroenterology;;    Family History  Problem Relation Age of Onset   Anemia Father    Benign prostatic hyperplasia Brother    Cancer Daughter    Breast cancer Daughter    Kidney cancer Son        doing ok    Medications- reviewed and updated Current Outpatient Medications  Medication Sig Dispense Refill   cyanocobalamin (VITAMIN B12) 1000 MCG/ML injection INJECT 1mL INTRAMUSCULARLY EVERY 30 DAYS (Patient taking differently: Inject 1,000 mcg into the muscle every 30 (thirty) days.) 10 mL 3   levothyroxine (SYNTHROID) 75 MCG tablet TAKE 1 TABLET BY MOUTH DAILY 100 tablet 2  Potassium Citrate 15 MEQ (1620 MG) TBCR Take 1 tablet by mouth daily.     simvastatin (ZOCOR) 20 MG tablet TAKE 1 TABLET BY MOUTH DAILY 100 tablet 2   No current facility-administered medications for this visit.    Allergies-reviewed and updated No Known Allergies  Social History   Social History Narrative   Married. 1 living son (daughter died of cancer). 1 grandchild. 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 3x a week mows with self propelled.       Hobbies: a lot of time at church- building ramps etc, enjoys sports- football, basketball, baseball.    Objective  Objective:  BP 110/72   Pulse 66   Temp (!) 97 F (36.1 C)   Ht 5\' 8"  (1.727 m)   Wt 140 lb 9.6 oz (63.8 kg)   SpO2 97%   BMI 21.38 kg/m  Gen: NAD, resting comfortably HEENT: Mucous membranes are moist. Oropharynx normal Neck: no thyromegaly CV: RRR no murmurs rubs or gallops Lungs: CTAB no crackles, wheeze, rhonchi Abdomen: soft/nontender/nondistended/normal bowel sounds. No rebound or guarding.  Ext: no edema Skin: warm, dry Neuro: grossly normal, moves all extremities, PERRLA Declines rectal or genitourinary exam    Assessment and Plan  89 y.o. male presenting for annual physical.  Health Maintenance counseling: 1. Anticipatory guidance: Patient counseled regarding regular dental exams -q6 months, eye exams -yearly,  avoiding smoking and second hand smoke , limiting alcohol to 2 beverages per day - doesn't drink, no illicit drugs .   2. Risk factor reduction:  Advised patient of need for regular exercise and diet rich and fruits and vegetables to reduce risk of heart attack and stroke.  Exercise- trying to walk regularly - in halls for instance and its a decent walk to meals.  Diet/weight management-weight down 10 pounds from last year but significant weight loss with recent gallbladder issues.  Wt Readings from Last 3 Encounters:  12/23/23 140 lb 9.6 oz (63.8 kg)  12/21/23 138 lb 3.2 oz (62.7 kg)  12/10/23 144 lb (65.3 kg)  3. Immunizations/screenings/ancillary studies-Recommended Tdap at pharmacy once better  Immunization History  Administered Date(s) Administered   Fluad Quad(high Dose 65+) 06/22/2019, 06/12/2020   Fluad Trivalent(High Dose 65+) 07/12/2023   Influenza Split 07/04/2014, 07/13/2023   Influenza Whole 10/12/2005,  07/26/2007, 07/12/2008, 07/15/2009, 07/29/2010, 07/12/2013   Influenza, High Dose Seasonal PF 07/02/2018, 06/13/2019, 07/26/2021, 07/08/2022   Influenza-Unspecified 08/12/2000, 09/11/2001, 09/11/2002, 07/13/2015, 07/24/2016, 07/13/2017, 07/02/2018   Moderna Covid-19 Fall Seasonal Vaccine 70yrs & older 07/10/2022   PFIZER(Purple Top)SARS-COV-2 Vaccination 10/31/2019, 11/21/2019, 07/09/2020, 04/15/2021   Pfizer Covid-19 Vaccine Bivalent Booster 63yrs & up 08/08/2021, 07/26/2023   Pneumococcal Conjugate-13 08/09/2014   Pneumococcal Polysaccharide-23 10/12/2002, 07/15/2009   Pneumococcal-Unspecified 11/01/2001   Td 10/12/2002   Tetanus 08/02/2013   Zoster Recombinant(Shingrix) 05/09/2020, 07/26/2020   Zoster, Live 08/19/2007   4. Prostate cancer screening- no longer being screened.  Sees Dr. Retta Diones for kidney stones- will be seeing new doctor Lab Results  Component Value Date   PSA 0.82 08/09/2014   PSA 0.90 08/02/2013   PSA 0.65 11/03/2011   5. Colon cancer screening - Colon cancer-right colectomy 04/08/2023-initially presented with rectal bleeding.  Stage III and required chemotherapy starting August 2024 which he completed December 2024.  He follows with Dr. Truett Perna.  Plan  is for CEA in June and they have opted out of surveillance CTs 6. Skin cancer screening-follows with Dr. Karlyn Agee with every 67-month visits given history of skin cancer. advised regular sunscreen use. Denies worrisome, changing, or new skin lesions.  7. Smoking associated screening (lung cancer screening, AAA screen 65-75, UA)- never smoker 8. STD screening - only active with wife  Status of chronic or acute concerns   # Recent cholecystectomy due to obstructive jaundice due to choledocholithiasis and cholelithiasis not responsive to ERCP with stone retrieval-patient continues to heal. Still getting a lot of gas   # Viral gastroenteritis versus reporting versus other viral illness-seen 2 days ago but had been  provided Zofran by surgery and seem to be improving from diarrhea and nausea.   - thankfully no more vomiting. Has had 2-3 episodes of diarrhea still per day  and plans to try imodium- wants to hold off on stool testing. Does look like it was a virus as wife came down with similar so not the meatballs  #Hyperlipidemia S: Medication: Simvastatin 20 mg daily- has been off of this since surgery. In 2019 we stopped aspirin due to bleeding with dental work and easy bruising. Lab Results  Component Value Date   CHOL 128 12/18/2022   HDL 43.90 12/18/2022   LDLCALC 71 12/18/2022   LDLDIRECT 187.8 12/02/2006   TRIG 68.0 12/18/2022   CHOLHDL 3 12/18/2022  A/P: #s close to ideal last year and update today- has had pause on simvastatin and restarted on the 10th so #s may be slightly high- as long as liver trending in right direction- likely continue- I may have him come back for repeat LFTs monthly until normalized  # Hypothyroidism S:medication: levothyroxine 75 mcg  Lab Results  Component Value Date   TSH 1.33 12/18/2022  A/P: hopefully stable- update tsh today. Continue current meds for now     #Pernicious anemia/B12 deficiency S: Patient self injects on a monthly basis Lab Results  Component Value Date   VITAMINB12 606 12/11/2023  A/P: levels have looked good- continue current medications    #Chronic kidney disease stage III S: GFR is typically in the 50s range -Patient knows to avoid NSAIDs  A/P: hopefully stable- update CMP  today. Continue without meds for now    #history of kidney stones- follows with urology for potassium citrate   Recommended follow up: Return in about 1 year (around 12/22/2024) for physical or sooner if needed.Schedule b4 you leave. Future Appointments  Date Time Provider Department Center  01/03/2024  3:00 PM Clerance Lav, [place] [facility]  03/20/2024 10:00 AM [facility] PHLEBOTOMIST [facility] None  03/20/2024 10:20 AM Ladene Artist, MD [facility] None     Lab/Order associations: fasting   ICD-10-CM   1. Preventative health care  Z00.00     2. Mixed hyperlipidemia  E78.2     3. Hypothyroidism, unspecified type  E03.9     4. Pernicious anemia  D51.0     5. Stage 3b chronic kidney disease (HCC)  N18.32       No orders of the defined types were placed in this encounter.   Return precautions advised.  Tana Conch, MD

## 2024-01-03 ENCOUNTER — Other Ambulatory Visit: Payer: Self-pay

## 2024-01-03 ENCOUNTER — Telehealth: Payer: Self-pay

## 2024-01-03 ENCOUNTER — Ambulatory Visit: Payer: Medicare Other | Admitting: Podiatry

## 2024-01-03 ENCOUNTER — Encounter: Payer: Self-pay | Admitting: Podiatry

## 2024-01-03 DIAGNOSIS — M79675 Pain in left toe(s): Secondary | ICD-10-CM | POA: Diagnosis not present

## 2024-01-03 DIAGNOSIS — M79674 Pain in right toe(s): Secondary | ICD-10-CM

## 2024-01-03 DIAGNOSIS — B351 Tinea unguium: Secondary | ICD-10-CM | POA: Diagnosis not present

## 2024-01-03 DIAGNOSIS — E875 Hyperkalemia: Secondary | ICD-10-CM

## 2024-01-03 NOTE — Telephone Encounter (Signed)
 Called and spoke with pt reviewed.  Copied from CRM [id number]. Topic: Clinical - Lab/Test Results >> Jan 03, 2024  8:47 AM Kathryne Eriksson wrote: Reason for CRM: Labs Results >> Jan 03, 2024  8:48 AM Kathryne Eriksson wrote: Patient is requesting a call back in regards to his lab results from his physical 12/23/23. Patient call back number is [phone]

## 2024-01-03 NOTE — Progress Notes (Signed)
 Subjective:  Patient ID: Chad Fisher, male    DOB: 1933/06/07,  MRN: [id number]   Chad Fisher presents to clinic today for:  Chief Complaint  Patient presents with   Nail Problem    "Cut my toenails."   Patient notes nails are thick, discolored, elongated and painful in shoegear when trying to ambulate.    PCP is Shelva Majestic, MD.  Past Medical History:  Diagnosis Date   Cancer [facility])    skin   Hernia 1987   right inguinal     Past Surgical History:  Procedure Laterality Date   BIOPSY  04/06/2023   Procedure: BIOPSY;  Surgeon: Hilarie Fredrickson, MD;  Location: [facility];  Service: Gastroenterology;;   CATARACT EXTRACTION, BILATERAL     CHOLECYSTECTOMY N/A 12/10/2023   Procedure: LAPAROSCOPIC CHOLECYSTECTOMY;  Surgeon: Griselda Miner, MD;  [facility]: [facility];  Service: General;  Laterality: N/A;   COLONOSCOPY N/A 04/06/2023   Procedure: COLONOSCOPY;  Surgeon: Hilarie Fredrickson, MD;  [facility];  Service: Gastroenterology;  Laterality: N/A;   ERCP N/A 12/06/2023   Procedure: ENDOSCOPIC RETROGRADE CHOLANGIOPANCREATOGRAPHY (ERCP);  Surgeon: Hilarie Fredrickson, MD;  Location: [facility];  Service: Gastroenterology;  Laterality: N/A;   HERNIA REPAIR     LAPAROTOMY N/A 04/08/2023   Procedure: EXPLORATORY LAPAROTOMY WITH RIGHT HEMICOLECTOMY;  Surgeon: Diamantina Monks, MD;  [facility];  Service: General;  Laterality: N/A;   MOHS SURGERY     left ear   POLYPECTOMY  04/06/2023   Procedure: POLYPECTOMY;  Surgeon: Hilarie Fredrickson, MD;  Location: [facility];  Service: Gastroenterology;;   REMOVAL OF STONES  12/06/2023   Procedure: REMOVAL OF STONES;  Surgeon: Hilarie Fredrickson, MD;  Location: [facility];  Service: Gastroenterology;;   Dennison Mascot  12/06/2023   Procedure: Dennison Mascot;  Surgeon: Hilarie Fredrickson, MD;  Location: [facility];  Service: Gastroenterology;;   SUBMUCOSAL TATTOO INJECTION  04/06/2023   Procedure: SUBMUCOSAL TATTOO INJECTION;  Surgeon: Hilarie Fredrickson, MD;  [facility];  Service: Gastroenterology;;    No Known Allergies  Review of Systems: Negative except as noted in the HPI.  Objective:  Chad Fisher is a pleasant 89 y.o. male in NAD. AAO x 3.  Vascular Examination: Capillary refill time is 3-5 seconds to toes bilateral. Palpable pedal pulses b/l LE. Digital hair present b/l.  Skin temperature gradient WNL b/l. No varicosities b/l. No cyanosis noted b/l.   Dermatological Examination: Pedal skin with normal turgor, texture and tone b/l. No open wounds. No interdigital macerations b/l. Toenails x10 are 3mm thick, discolored, dystrophic with subungual debris. There is pain with compression of the nail plates.  They are elongated x10.       Latest Ref Rng & Units 04/08/2023    3:58 AM  Hemoglobin A1C  Hemoglobin-A1c 4.8 - 5.6 % 6.3    Assessment/Plan: 1. Pain due to onychomycosis of toenails of both feet    The mycotic toenails were sharply debrided x10 with sterile nail nippers and a power debriding burr to decrease bulk/thickness and length.    Return in about 3 months (around 04/04/2024) for RFC.   Clerance Lav, DPM, FACFAS [facility]     [address]  [place], [place] [address]                Office [phone]  Fax [phone]

## 2024-01-03 NOTE — Telephone Encounter (Signed)
 Called and spoke with pt and labs reviewed.  Copied from CRM [id number]. Topic: Clinical - Lab/Test Results >> Jan 03, 2024  8:47 AM Kathryne Eriksson wrote: Reason for CRM: Labs Results >> Jan 03, 2024  8:48 AM Kathryne Eriksson wrote: Patient is requesting a call back in regards to his lab results from his physical 12/23/23. Patient call back number is [phone]

## 2024-01-20 ENCOUNTER — Other Ambulatory Visit: Payer: Self-pay | Admitting: Family Medicine

## 2024-02-08 ENCOUNTER — Other Ambulatory Visit (INDEPENDENT_AMBULATORY_CARE_PROVIDER_SITE_OTHER)

## 2024-02-08 DIAGNOSIS — E875 Hyperkalemia: Secondary | ICD-10-CM

## 2024-02-08 LAB — TSH: TSH: 4.09 u[IU]/mL (ref 0.35–5.50)

## 2024-02-08 LAB — BASIC METABOLIC PANEL WITH GFR
BUN: 15 mg/dL (ref 6–23)
CO2: 31 meq/L (ref 19–32)
Calcium: 8.9 mg/dL (ref 8.4–10.5)
Chloride: 104 meq/L (ref 96–112)
Creatinine, Ser: 1.14 mg/dL (ref 0.40–1.50)
GFR: 57.36 mL/min — ABNORMAL LOW (ref >60.00)
Glucose, Bld: 78 mg/dL (ref 70–99)
Potassium: 4.7 meq/L (ref 3.5–5.1)
Sodium: 140 meq/L (ref 135–145)

## 2024-03-07 DIAGNOSIS — N281 Cyst of kidney, acquired: Secondary | ICD-10-CM | POA: Diagnosis not present

## 2024-03-07 DIAGNOSIS — N200 Calculus of kidney: Secondary | ICD-10-CM | POA: Diagnosis not present

## 2024-03-13 ENCOUNTER — Ambulatory Visit: Admitting: Podiatry

## 2024-03-13 DIAGNOSIS — M79674 Pain in right toe(s): Secondary | ICD-10-CM

## 2024-03-13 DIAGNOSIS — B351 Tinea unguium: Secondary | ICD-10-CM

## 2024-03-13 DIAGNOSIS — M79675 Pain in left toe(s): Secondary | ICD-10-CM

## 2024-03-13 NOTE — Progress Notes (Signed)
 Subjective:  Patient ID: Chad Fisher, male    DOB: [birth year],  MRN: [id number]   Chad Fisher presents to clinic today for:  Chief Complaint  Patient presents with   Nail Problem    Nail trim    Patient notes nails are thick, discolored, elongated and painful in shoegear when trying to ambulate.    PCP is Almira Jaeger, MD.  Past Medical History:  Diagnosis Date   Cancer [facility])    skin   Hernia 1987   right inguinal     Past Surgical History:  Procedure Laterality Date   BIOPSY  04/06/2023   Procedure: BIOPSY;  Surgeon: Tobin Forts, MD;  Location: [facility];  Service: Gastroenterology;;   CATARACT EXTRACTION, BILATERAL     CHOLECYSTECTOMY N/A 12/10/2023   Procedure: LAPAROSCOPIC CHOLECYSTECTOMY;  Surgeon: Caralyn Chandler, MD;  [facility];  Service: General;  Laterality: N/A;   COLONOSCOPY N/A 04/06/2023   Procedure: COLONOSCOPY;  Surgeon: Tobin Forts, MD;  [facility];  Service: Gastroenterology;  Laterality: N/A;   ERCP N/A 12/06/2023   Procedure: ENDOSCOPIC RETROGRADE CHOLANGIOPANCREATOGRAPHY (ERCP);  Surgeon: Tobin Forts, MD;  Location: [facility];  Service: Gastroenterology;  Laterality: N/A;   HERNIA REPAIR     LAPAROTOMY N/A 04/08/2023   Procedure: EXPLORATORY LAPAROTOMY WITH RIGHT HEMICOLECTOMY;  Surgeon: Anda Bamberg, MD;  [facility];  Service: General;  Laterality: N/A;   MOHS SURGERY     left ear   POLYPECTOMY  04/06/2023   Procedure: POLYPECTOMY;  Surgeon: Tobin Forts, MD;  Location: [facility];  Service: Gastroenterology;;   REMOVAL OF STONES  12/06/2023   Procedure: REMOVAL OF STONES;  Surgeon: Tobin Forts, MD;  Location: [facility];  Service: Gastroenterology;;   [address]  12/06/2023   Procedure: [address];  Surgeon: Tobin Forts, MD;  Location: [facility];  Service: Gastroenterology;;   SUBMUCOSAL TATTOO INJECTION  04/06/2023   Procedure: SUBMUCOSAL TATTOO INJECTION;  Surgeon: Tobin Forts,  MD;  [facility];  Service: Gastroenterology;;    No Known Allergies  Review of Systems: Negative except as noted in the HPI.  Objective:  Chad Fisher is a pleasant 89 y.o. male in NAD. AAO x 3.  Vascular Examination: Capillary refill time is 3-5 seconds to toes bilateral. Palpable pedal pulses b/l LE. Digital hair present b/l.  Skin temperature gradient WNL b/l. No varicosities b/l. No cyanosis noted b/l.   Dermatological Examination: Pedal skin with normal turgor, texture and tone b/l. No open wounds. No interdigital macerations b/l. Toenails x10 are 3mm thick, discolored, dystrophic with subungual debris. There is pain with compression of the nail plates.  They are elongated x10.       Latest Ref Rng & Units 04/08/2023    3:58 AM  Hemoglobin A1C  Hemoglobin-A1c 4.8 - 5.6 % 6.3    Assessment/Plan: 1. Pain due to onychomycosis of toenails of both feet    The mycotic toenails were sharply debrided x10 with sterile nail nippers and a power debriding Chad to decrease bulk/thickness and length.    Return in about 9 weeks (around 05/15/2024) for RFC.   Joe Murders, DPM, FACFAS [facility]     [address]  [place], [place] [address]                Office [phone]  Fax [phone]

## 2024-03-20 ENCOUNTER — Inpatient Hospital Stay: Payer: Medicare Other | Attending: Oncology

## 2024-03-20 ENCOUNTER — Inpatient Hospital Stay: Payer: Medicare Other | Admitting: Oncology

## 2024-03-20 ENCOUNTER — Ambulatory Visit: Payer: Self-pay | Admitting: Oncology

## 2024-03-20 VITALS — BP 104/66 | HR 60 | Temp 97.9°F | Resp 18 | Ht 68.0 in | Wt 147.4 lb

## 2024-03-20 DIAGNOSIS — C189 Malignant neoplasm of colon, unspecified: Secondary | ICD-10-CM | POA: Diagnosis not present

## 2024-03-20 DIAGNOSIS — Z85038 Personal history of other malignant neoplasm of large intestine: Secondary | ICD-10-CM | POA: Insufficient documentation

## 2024-03-20 LAB — CEA (ACCESS): CEA (CHCC): 2 ng/mL (ref 0.00–5.00)

## 2024-03-20 NOTE — Telephone Encounter (Signed)
 Patient gave verbal understanding and had no further questions or concerns

## 2024-03-20 NOTE — Progress Notes (Signed)
 [facility] OFFICE PROGRESS NOTE   Diagnosis: Colon cancer  INTERVAL HISTORY:   [name] returns as scheduled.  He feels well.  No difficulty with bowel function.  No bleeding.  He was admitted in February with an obstructing common duct stone.  He underwent ERCP with stone removal followed by a cholecystectomy. Skin changes at the arms have resolved.  He has a persistent right inguinal hernia.  The hernia is occasionally uncomfortable. Objective:  Vital signs in last 24 hours:  Blood pressure 104/66, pulse 60, temperature 97.9 F (36.6 C), temperature source Temporal, resp. rate 18, height 5\' 8"  (1.727 m), weight 147 lb 6.4 oz (66.9 kg), SpO2 98%.  Lymphatics: No cervical, supraclavicular, axillary, or inguinal nodes Resp: Lungs clear bilaterally Cardio: Regular rate and rhythm GI: No hepatosplenomegaly, no mass, I cannot appreciate a right inguinal hernia Vascular: No leg edema   Lab Results:  Lab Results  Component Value Date   WBC 4.5 12/23/2023   HGB 13.5 12/23/2023   HCT 40.3 12/23/2023   MCV 105.4 (H) 12/23/2023   PLT 274.0 12/23/2023   NEUTROABS 2.2 12/23/2023    CMP  Lab Results  Component Value Date   NA 140 02/08/2024   K 4.7 02/08/2024   CL 104 02/08/2024   CO2 31 02/08/2024   GLUCOSE 78 02/08/2024   BUN 15 02/08/2024   CREATININE 1.14 02/08/2024   CALCIUM  8.9 02/08/2024   PROT 7.2 12/23/2023   ALBUMIN  3.7 12/23/2023   AST 45 (H) 12/23/2023   ALT 47 12/23/2023   ALKPHOS 200 (H) 12/23/2023   BILITOT 1.9 (H) 12/23/2023   GFRNONAA >60 12/12/2023   GFRAA (L) 12/10/2010    57        The eGFR has been calculated using the MDRD equation. This calculation has not been validated in all clinical situations. eGFR's persistently <60 mL/min signify possible Chronic Kidney Disease.    Lab Results  Component Value Date   CEA1 1.8 12/06/2023   CEA 2.02 11/12/2023     Medications: I have reviewed the patient's current  medications.   Assessment/Plan: Adenocarcinoma of the cecum, stage IIIb (pT3pN1b,cM0), status post a right colectomy 04/08/2023 Moderately differentiated adenocarcinoma, 3/24 lymph nodes, negative resection margins, no tumor deposits, no loss of mismatch repair protein expression, MSS Colonoscopy 04/07/2023-right colon mass-invasive moderately differentiated adenocarcinoma transverse colon polyp-tubular adenoma CTs 04/06/2023-cecum mass, ileocolic lymphadenopathy, 4 mm lingula nodule-nonspecific Cycle 1 Xeloda  05/17/2023, placed on hold 05/24/2023 following the morning dose due to skin toxicity and mucositis Cycle 2 Xeloda  06/08/2023, Xeloda  dose reduced to 500 mg twice daily Cycle 3 Xeloda  06/29/2023, Xeloda  500 mg twice daily Cycle 4 Xeloda  07/20/2023, 500 mg twice daily Cycle 5 Xeloda  08/10/2023, 500 mg twice daily Cycle 6 Xeloda  08/31/2023, 1000 mg every morning, 500 mg every afternoon Cycle 7 Xeloda  09/21/2023, 1000 mg in the morning, 500 mg in the afternoon Cycle 8 Xeloda  10/12/2023, 1000 mg in the morning, 500 mg in the afternoon Colon polyps-tubular adenoma on colonoscopy 04/07/2023 History of kidney stones Family history of cancer-daughter with breast cancer, son with renal cell carcinoma, brother with prostate cancer Skin toxicity and mucositis secondary to Xeloda  with cycle 1, dose reduced with cycle 2 Choledocholithiasis/cholecystitis February 2025, status post ERCP with stone retrieval 12/06/2023, cholecystectomy 12/10/2023     Disposition: [name] is in clinical remission from colon cancer.  The CEA is normal.  He will return for an office visit and CEA in 6 months.  He does not wish  to undergo a surveillance colonoscopy unless Dr. Elvin Hammer feels this is necessary.  He will follow-up with surgery if the hernia is more painful.  Coni Deep, MD  03/20/2024  11:13 AM

## 2024-03-20 NOTE — Telephone Encounter (Signed)
-----   Message from Coni Deep sent at 03/20/2024  4:28 PM EDT ----- Please call patient, the CEA is normal, follow-up as scheduled

## 2024-05-08 DIAGNOSIS — L570 Actinic keratosis: Secondary | ICD-10-CM | POA: Diagnosis not present

## 2024-05-08 DIAGNOSIS — D485 Neoplasm of uncertain behavior of skin: Secondary | ICD-10-CM | POA: Diagnosis not present

## 2024-05-08 DIAGNOSIS — D692 Other nonthrombocytopenic purpura: Secondary | ICD-10-CM | POA: Diagnosis not present

## 2024-05-08 DIAGNOSIS — D0471 Carcinoma in situ of skin of right lower limb, including hip: Secondary | ICD-10-CM | POA: Diagnosis not present

## 2024-05-15 ENCOUNTER — Ambulatory Visit: Admitting: Podiatry

## 2024-05-15 ENCOUNTER — Encounter: Payer: Self-pay | Admitting: Podiatry

## 2024-05-15 DIAGNOSIS — M79675 Pain in left toe(s): Secondary | ICD-10-CM

## 2024-05-15 DIAGNOSIS — M79674 Pain in right toe(s): Secondary | ICD-10-CM | POA: Diagnosis not present

## 2024-05-15 DIAGNOSIS — B351 Tinea unguium: Secondary | ICD-10-CM | POA: Diagnosis not present

## 2024-05-15 NOTE — Progress Notes (Unsigned)
 Subjective:  Patient ID: Chad Fisher, male    DOB: September 10, 1933,  MRN: [id number]  NHAN QUALLEY presents to clinic today for:  Chief Complaint  Patient presents with   RFC    Pt is here for RFC toe nail trim.  No caluses No foot pain  Non Diabetic No blood Thinners   Patient notes nails are thick, discolored, elongated and painful in shoegear when trying to ambulate.    PCP is Katrinka Garnette KIDD, MD.  Past Medical History:  Diagnosis Date   [name])    skin   Hernia 1987   right inguinal     Past Surgical History:  Procedure Laterality Date   BIOPSY  04/06/2023   Procedure: BIOPSY;  Surgeon: Abran Norleen SAILOR, MD;  Location: [facility];  Service: Gastroenterology;;   CATARACT EXTRACTION, BILATERAL     CHOLECYSTECTOMY N/A 12/10/2023   Procedure: LAPAROSCOPIC CHOLECYSTECTOMY;  Surgeon: Curvin Deward MOULD, MD;  [facility];  Service: General;  Laterality: N/A;   COLONOSCOPY N/A 04/06/2023   Procedure: COLONOSCOPY;  Surgeon: Abran Norleen SAILOR, MD;  [facility];  Service: Gastroenterology;  Laterality: N/A;   ERCP N/A 12/06/2023   Procedure: ENDOSCOPIC RETROGRADE CHOLANGIOPANCREATOGRAPHY (ERCP);  Surgeon: Abran Norleen SAILOR, MD;  Location: [facility];  Service: Gastroenterology;  Laterality: N/A;   HERNIA REPAIR     LAPAROTOMY N/A 04/08/2023   Procedure: EXPLORATORY LAPAROTOMY WITH RIGHT HEMICOLECTOMY;  Surgeon: Paola Dreama SAILOR, MD;  [facility];  Service: General;  Laterality: N/A;   MOHS SURGERY     left ear   POLYPECTOMY  04/06/2023   Procedure: POLYPECTOMY;  Surgeon: Abran Norleen SAILOR, MD;  Location: [facility];  Service: Gastroenterology;;   REMOVAL OF STONES  12/06/2023   Procedure: REMOVAL OF STONES;  Surgeon: Abran Norleen SAILOR, MD;  Location: [facility];  Service: Gastroenterology;;   ANNETT  12/06/2023   Procedure: ANNETT;  Surgeon: Abran Norleen SAILOR, MD;  Location: [facility];  Service: Gastroenterology;;   SUBMUCOSAL TATTOO INJECTION  04/06/2023    Procedure: SUBMUCOSAL TATTOO INJECTION;  Surgeon: Abran Norleen SAILOR, MD;  [facility];  Service: Gastroenterology;;   No Known Allergies  Review of Systems: Negative except as noted in the HPI.  Objective:  Chad Fisher is a pleasant 89 y.o. male in NAD. AAO x 3.  Vascular Examination: Capillary refill time is 3-5 seconds to toes bilateral. Palpable pedal pulses b/l LE. Digital hair present b/l.  Skin temperature gradient WNL b/l. No varicosities b/l. No cyanosis noted b/l.   Dermatological Examination: Pedal skin with normal turgor, texture and tone b/l. No open wounds. No interdigital macerations b/l. Toenails x10 are 3mm thick, discolored, dystrophic with subungual debris. There is pain with compression of the nail plates.  They are elongated x10.    Assessment/Plan: 1. Pain due to onychomycosis of toenails of both feet    The mycotic toenails were sharply debrided x10 with sterile nail nippers and a power debriding burr to decrease bulk/thickness and length.    Return in about 3 months (around 08/15/2024) for RFC.   [name] Imperial, DPM, FACFAS [facility]     [address], [place] [address]  Office [phone]  Fax [phone]

## 2024-06-18 ENCOUNTER — Other Ambulatory Visit: Payer: Self-pay | Admitting: Family Medicine

## 2024-06-18 DIAGNOSIS — E7800 Pure hypercholesterolemia, unspecified: Secondary | ICD-10-CM

## 2024-07-17 ENCOUNTER — Ambulatory Visit (INDEPENDENT_AMBULATORY_CARE_PROVIDER_SITE_OTHER): Admitting: Podiatry

## 2024-07-17 DIAGNOSIS — M79675 Pain in left toe(s): Secondary | ICD-10-CM | POA: Diagnosis not present

## 2024-07-17 DIAGNOSIS — M79674 Pain in right toe(s): Secondary | ICD-10-CM | POA: Diagnosis not present

## 2024-07-17 DIAGNOSIS — B351 Tinea unguium: Secondary | ICD-10-CM

## 2024-07-17 NOTE — Progress Notes (Signed)
 Subjective:  Patient ID: Chad Fisher, male    DOB: 17-Oct-1932,  MRN: [id number]  Chad Fisher presents to clinic today for:  Chief Complaint  Patient presents with   [facility]     RFC Non diabetic toenail trim.    Patient notes nails are thick, discolored, elongated and painful in shoegear when trying to ambulate.    PCP is Katrinka Garnette KIDD, MD.  Past Medical History:  Diagnosis Date   Fisher [facility])    skin   Hernia 1987   right inguinal     Past Surgical History:  Procedure Laterality Date   BIOPSY  04/06/2023   Procedure: BIOPSY;  Surgeon: Abran Norleen SAILOR, MD;  Location: [facility];  Service: Gastroenterology;;   CATARACT EXTRACTION, BILATERAL     CHOLECYSTECTOMY N/A 12/10/2023   Procedure: LAPAROSCOPIC CHOLECYSTECTOMY;  Surgeon: Curvin Deward MOULD, MD;  [facility];  Service: General;  Laterality: N/A;   COLONOSCOPY N/A 04/06/2023   Procedure: COLONOSCOPY;  Surgeon: Abran Norleen SAILOR, MD;  [facility];  Service: Gastroenterology;  Laterality: N/A;   ERCP N/A 12/06/2023   Procedure: ENDOSCOPIC RETROGRADE CHOLANGIOPANCREATOGRAPHY (ERCP);  Surgeon: Abran Norleen SAILOR, MD;  Location: [facility];  Service: Gastroenterology;  Laterality: N/A;   HERNIA REPAIR     LAPAROTOMY N/A 04/08/2023   Procedure: EXPLORATORY LAPAROTOMY WITH RIGHT HEMICOLECTOMY;  Surgeon: Paola Dreama SAILOR, MD;  [facility];  Service: General;  Laterality: N/A;   MOHS SURGERY     left ear   POLYPECTOMY  04/06/2023   Procedure: POLYPECTOMY;  Surgeon: Abran Norleen SAILOR, MD;  Location: [facility];  Service: Gastroenterology;;   REMOVAL OF STONES  12/06/2023   Procedure: REMOVAL OF STONES;  Surgeon: Abran Norleen SAILOR, MD;  Location: [facility];  Service: Gastroenterology;;   ANNETT  12/06/2023   Procedure: ANNETT;  Surgeon: Abran Norleen SAILOR, MD;  Location: [facility];  Service: Gastroenterology;;   SUBMUCOSAL TATTOO INJECTION  04/06/2023   Procedure: SUBMUCOSAL TATTOO INJECTION;  Surgeon: Abran Norleen SAILOR, MD;  [facility];  Service: Gastroenterology;;   No Known Allergies  Review of Systems: Negative except as noted in the HPI.  Objective:  Chad Fisher is a pleasant 89 y.o. male in NAD. AAO x 3.  Vascular Examination: Capillary refill time is 3-5 seconds to toes bilateral. Palpable pedal pulses b/l LE. Digital hair present b/l.  Skin temperature gradient WNL b/l. No varicosities b/l. No cyanosis noted b/l.   Dermatological Examination: Pedal skin with normal turgor, texture and tone b/l. No open wounds. No interdigital macerations b/l. Toenails x10 are 3mm thick, discolored, dystrophic with subungual debris. There is pain with compression of the nail plates.  They are elongated x10.    Assessment/Plan: 1. Pain due to onychomycosis of toenails of both feet    The mycotic toenails were sharply debrided x10 with sterile nail nippers and a power debriding burr to decrease bulk/thickness and length.    Return in about 3 months (around 10/17/2024).   [name] Imperial, DPM, FACFAS [facility]     [address] [place], [place] [address]                Office [phone]  Fax [phone]

## 2024-08-14 ENCOUNTER — Ambulatory Visit: Payer: Self-pay | Admitting: Family Medicine

## 2024-08-14 ENCOUNTER — Ambulatory Visit

## 2024-08-14 ENCOUNTER — Encounter: Payer: Self-pay | Admitting: Family Medicine

## 2024-08-14 ENCOUNTER — Ambulatory Visit: Admitting: Family Medicine

## 2024-08-14 VITALS — BP 118/68 | HR 57 | Temp 98.4°F | Ht 68.0 in | Wt 155.8 lb

## 2024-08-14 DIAGNOSIS — Z23 Encounter for immunization: Secondary | ICD-10-CM

## 2024-08-14 DIAGNOSIS — S40811A Abrasion of right upper arm, initial encounter: Secondary | ICD-10-CM | POA: Diagnosis not present

## 2024-08-14 DIAGNOSIS — M25572 Pain in left ankle and joints of left foot: Secondary | ICD-10-CM | POA: Diagnosis not present

## 2024-08-14 DIAGNOSIS — M79605 Pain in left leg: Secondary | ICD-10-CM

## 2024-08-14 NOTE — Progress Notes (Signed)
 Phone [phone] In person visit   Subjective:   Chad Fisher is a 89 y.o. year old very pleasant male patient who presents for/with See problem oriented charting Chief Complaint  Patient presents with   Fall    Patient fell last night at home down 2 steps last night at home; declines hitting head;    Ankle Pain    Left ankle and foot pain;    Abrasion    Both arms and elbows injured during fall;     Past Medical History-  Patient Active Problem List   Diagnosis Date Noted   Adenocarcinoma of colon (HCC) 04/07/2023    Priority: High   Chronic calculous cholecystitis s/p lap cholecysetomy 12/10/2023 12/05/2023    Priority: Medium    CKD (chronic kidney disease), stage III (HCC) 11/20/2019    Priority: Medium    History of kidney stones 02/03/2016    Priority: Medium    Hypothyroidism 08/02/2013    Priority: Medium    Pernicious anemia 08/02/2013    Priority: Medium    Hyperlipidemia 12/20/2007    Priority: Medium    Hyperbilirubinemia 12/05/2023    Priority: Low   Macrocytosis 12/05/2023    Priority: Low   Personal history of urinary calculi 11/11/2022    Priority: Low   SKIN CANCER, HX OF 05/04/2007    Priority: Low   Cirrhosis of liver (HCC) 12/11/2023   Normocytic anemia 04/06/2023    Medications- reviewed and updated Current Outpatient Medications  Medication Sig Dispense Refill   cyanocobalamin  (VITAMIN B12) 1000 MCG/ML injection Inject 1 mL (1,000 mcg total) into the muscle every 30 (thirty) days. 9 mL 3   levothyroxine  (SYNTHROID ) 75 MCG tablet TAKE 1 TABLET BY MOUTH DAILY 100 tablet 2   Multiple Vitamins-Minerals (PRESERVISION AREDS 2 PO) Take by mouth.     Potassium Citrate  15 MEQ (1620 MG) TBCR Take 1 tablet by mouth daily.     simvastatin  (ZOCOR ) 20 MG tablet TAKE 1 TABLET BY MOUTH DAILY 100 tablet 2   No current facility-administered medications for this visit.     Objective:  BP 118/68 (BP Location: Left Arm, Patient Position: Sitting, Cuff  Size: Normal)   Pulse (!) 57   Temp 98.4 F (36.9 C) (Temporal)   Ht 5' 8 (1.727 m)   Wt 155 lb 12.8 oz (70.7 kg)   SpO2 98%   BMI 23.69 kg/m  Gen: NAD, resting comfortably CV: RRR no murmurs rubs or gallops Lungs: CTAB no crackles, wheeze, rhonchi Ext: no edema other than around the left ankle-edema noted particularly mediallyy-has pain both anterior and posterior portion of medial epicondyle-has pain along anterior portion of lateral epicondyle. - Also has some pain over his left shin -Right ankle normal    Assessment and Plan   # Fall/ankle pain S: Patient had a fall last night at home-down 2 steps from a step ladder working in the closet. Reports there ws no loss of consciousness  Did not hit his head thankfully.  He did note immediate left ankle and foot pain- medial ankle but hurts into the shin - so much pain walking with 2 canes .  Pain can be moderate to severe  He also has some abrasions on his arms and elbows during the fall-denies significant pain in these areas . Bandaged to stop the bleeding but did not have chance to wash before that.  -Last tetanus 2014 A/P: Patient with substantial left ankle pain-concern for fracture-offered crutches but he prefers 2 canes  he has -Also with pain over the tibia -Recommended x-rays of both areas-he agrees and we ordered a stat -Potential orthopedic referral if fracture.  May consider sports medicine consult if only sprains - discusse fall avoidance and trying to stay off the leg is much as possible -Td today  -encouraged to avoid step stool from here on out and importance of fall avoidance -Declines medicines stronger than Tylenol -has found this helpful  Recommended follow up: Return for as needed for new, worsening, persistent symptoms. Future Appointments  Date Time Provider Department Center  09/18/2024  3:15 PM Loel Awanda [facility] RO[facility]  09/19/2024 10:15 AM [facility] PHLEBOTOMIST [facility] None  09/19/2024  10:40 AM Cloretta Arley NOVAK, MD [facility] None  12/26/2024  8:00 AM Katrinka Garnette KIDD, MD [facility]    Lab/Order associations:   ICD-10-CM   1. Acute left ankle pain  M25.572 DG Ankle Complete Left    DG Ankle Complete Left    2. Left leg pain  M79.605 DG Tibia/Fibula Left    DG Tibia/Fibula Left    3. Abrasion of right upper extremity, initial encounter  S40.811A Td : Tetanus/diphtheria >7yo Preservative  free     I personally spent a total of 26 minutes in the care of the patient today including preparing to see the patient, getting/reviewing separately obtained history, performing a medically appropriate exam/evaluation, counseling and educating, and documenting clinical information in the EHR.   Return precautions advised.  Garnette Katrinka, MD

## 2024-08-14 NOTE — Patient Instructions (Addendum)
 Left ankle pain- rule out fracture with x-ray as well as of the tibia-ordered stat for today  May refer to orthopedics if fracture versus sports medicine-depends on results  Td/tetanus shot today under abrasion of right arm  Okay to use Tylenol  for pain-let me know if you need something stronger  Recommended follow up: Return for as needed for new, worsening, persistent symptoms.

## 2024-08-15 ENCOUNTER — Other Ambulatory Visit: Payer: Self-pay | Admitting: Family Medicine

## 2024-08-15 DIAGNOSIS — S82892A Other fracture of left lower leg, initial encounter for closed fracture: Secondary | ICD-10-CM

## 2024-08-16 ENCOUNTER — Encounter: Payer: Self-pay | Admitting: Family Medicine

## 2024-08-16 ENCOUNTER — Ambulatory Visit: Admitting: Family Medicine

## 2024-08-16 VITALS — BP 148/82 | HR 85 | Ht 68.0 in | Wt 150.0 lb

## 2024-08-16 DIAGNOSIS — S82892A Other fracture of left lower leg, initial encounter for closed fracture: Secondary | ICD-10-CM | POA: Diagnosis not present

## 2024-08-16 NOTE — Patient Instructions (Addendum)
 Thank you for coming in today.   [employer] provided today.   See you back in 2 weeks.

## 2024-08-16 NOTE — Progress Notes (Signed)
   I, Chad Fisher, CMA acting as a scribe for Chad Lloyd, Fisher.  Chad Fisher is a 89 y.o. male who presents to [facility] at [facility] today for L ankle pain do to a medial malleolar fx. Pt suffer a fall from a step ladder on 11/3. Pt locates pain to entire ankle joint. Redness and swelling present. Ambulating with a cane today. Minimal pain at rest, pain with ambulation.   Swelling: present Treatments tried: using 2 canes to ambulate, Tylenol   Dx imaging: 08/14/24 L tib/fib & ankle XR  Pertinent review of systems: No fevers or chills  Relevant historical information: History of colon cancer and CKD.   Exam:  BP (!) 148/82   Pulse 85   Ht 5' 8 (1.727 m)   Wt 150 lb (68 kg)   SpO2 98%   BMI 22.81 kg/m  General: Well Developed, well nourished, and in no acute distress.   MSK: Left ankle tender palpation medial malleolus.  Stability not tested.  Mild antalgic gait using a cane to ambulate.    Lab and Radiology Results No results found for this or any previous visit (from the past 72 hours). DG Tibia/Fibula Left Result Date: 08/14/2024 EXAM: 1 VIEW(S) XRAY OF THE LEFT TIBIA AND FIBULA 08/14/2024 02:49:01 PM COMPARISON: None available. CLINICAL HISTORY: pain along tibia after fall in closet off 2 stairs FINDINGS: BONES AND JOINTS: Thin, linear radiolucency extending through the medial malleolus may reflect underlying, nondisplaced fracture. No focal osseous lesion. No joint dislocation. SOFT TISSUES: Mild overlying soft tissue edema noted. Vascular calcifications. IMPRESSION: 1. Possible nondisplaced medial malleolar fracture with mild overlying soft tissue edema. Consider more definitive characterization with CT of the right ankle. Electronically signed by: Chad Fisher 08/14/2024 04:04 PM EST RP Workstation: [id number]   DG Ankle Complete Left Result Date: 08/14/2024 CLINICAL DATA:  fall in closet off 2 step ladder- medial ankle pain on left ankle- also  lateral EXAM: LEFT ANKLE COMPLETE - 3+ VIEW COMPARISON:  None Available. FINDINGS: No acute fracture or dislocation. No ankle mortise widening. The talar dome is intact. There is no evidence of arthropathy or other focal bone abnormality. Peripheral vascular atherosclerosis. IMPRESSION: No acute fracture or dislocation. Electronically Signed   By: Chad Myers M.D.   On: 08/14/2024 15:30   I, Chad Fisher, personally (independently) visualized and performed the interpretation of the images attached in this note.     Assessment and Plan: 89 y.o. male with left ankle fracture.  Fracture is at the medial malleolus and more of an avulsion type and is nondisplaced.  He is not a candidate for surgery and able to walk pretty well.  Will help to stabilize the ankle using a clamshell Aircast and reassess in 2 weeks.  We talked about stability devices.  Ideally he should use a walker.  He is thinking about using a cane which is less ideal but probably okay.  Check back in 2 weeks.   PDMP not reviewed this encounter. No orders of the defined types were placed in this encounter.  No orders of the defined types were placed in this encounter.    Discussed warning signs or symptoms. Please see discharge instructions. Patient expresses understanding.   The above documentation has been reviewed and is accurate and complete Chad Fisher, M.D.

## 2024-08-18 ENCOUNTER — Telehealth: Payer: Self-pay | Admitting: Family Medicine

## 2024-08-18 NOTE — Telephone Encounter (Signed)
 Spoke to pt and answered his questions. Pt verbalized understanding.

## 2024-08-18 NOTE — Telephone Encounter (Signed)
 Patient called and has some questions about his air cast. Please call back.

## 2024-08-30 ENCOUNTER — Ambulatory Visit

## 2024-08-30 ENCOUNTER — Ambulatory Visit: Admitting: Family Medicine

## 2024-08-30 VITALS — BP 128/80 | HR 77 | Ht 68.0 in | Wt 152.0 lb

## 2024-08-30 DIAGNOSIS — S82892A Other fracture of left lower leg, initial encounter for closed fracture: Secondary | ICD-10-CM | POA: Diagnosis not present

## 2024-08-30 DIAGNOSIS — S82892D Other fracture of left lower leg, subsequent encounter for closed fracture with routine healing: Secondary | ICD-10-CM

## 2024-08-30 NOTE — Patient Instructions (Addendum)
 Thank you for coming in today.   [facility]: Full Ankle  Address: [address], [place], [place] [address] Phone: [phone]  Check back in 2 weeks

## 2024-08-30 NOTE — Progress Notes (Signed)
   [name], PhD, LAT, ATC acting as a scribe for Artist Lloyd, MD.  Chad Fisher is a 89 y.o. male who presents to [facility] at [facility] today for 2-wk f/u L medial malleolar fx. Pt was last seen by Dr. Lloyd on 08/16/24 and was placed in a clamshell Aircast and they discussed using a cane or walker.  Today, pt reports his ankle is feeling much better. He notes cont'd swelling.   Dx imaging: 08/14/24 L tib/fib & ankle XR   Pertinent review of systems: no fever or chills  Relevant historical information: liver disease, CKD   Exam:  BP 128/80   Pulse 77   Ht 5' 8 (1.727 m)   Wt 152 lb (68.9 kg)   SpO2 94%   BMI 23.11 kg/m  General: Well Developed, well nourished, and in no acute distress.   MSK: Left ankle: Moderate swelling. Reduced ankle motion.  Mild TTP left medial mal.  Non-antalgic gait.     Lab and Radiology Results  Xray images left ankle images obtained today personally and independently interpreted.  Possible nondisplaced fracture medial malleolus.  Difficult to tell.  Certainly no large displaced fracture is present. Await formal radiology review.      Assessment and Plan: 89 y.o. male with possible left medial ankle fracture.  I think this is more ankle sprain than a true fracture.  It is very hard to appreciate any fracture on today's x-ray.  He is doing very well with little immobilization.  He is unable to tolerate the clamshell Aircast.  Will try switching to a compression ankle sleeve and advancing weightbearing activity as tolerated.  Recheck in about 2 weeks.   PDMP not reviewed this encounter. Orders Placed This Encounter  Procedures   DG Ankle Complete Left    Standing Status:   Future    Number of Occurrences:   1    Expiration Date:   08/30/2025    Reason for Exam (SYMPTOM  OR DIAGNOSIS REQUIRED):   left ankle pain    Preferred imaging location?:   [place] [place] [facility]   No orders of the defined types were  placed in this encounter.    Discussed warning signs or symptoms. Please see discharge instructions. Patient expresses understanding.   The above documentation has been reviewed and is accurate and complete Artist Lloyd, M.D.

## 2024-09-04 ENCOUNTER — Ambulatory Visit: Payer: Self-pay | Admitting: Family Medicine

## 2024-09-04 NOTE — Progress Notes (Signed)
 Left ankle x-ray shows a possible broken bone like we talked about in clinic.  Continue treatment as we discussed.

## 2024-09-09 ENCOUNTER — Encounter: Payer: Self-pay | Admitting: Internal Medicine

## 2024-09-13 ENCOUNTER — Ambulatory Visit: Admitting: Family Medicine

## 2024-09-13 VITALS — BP 132/80 | HR 64 | Ht 68.0 in | Wt 152.0 lb

## 2024-09-13 DIAGNOSIS — S82892D Other fracture of left lower leg, subsequent encounter for closed fracture with routine healing: Secondary | ICD-10-CM | POA: Diagnosis not present

## 2024-09-13 NOTE — Progress Notes (Unsigned)
   [name], PhD, LAT, ATC acting as a scribe for Artist Lloyd, MD.  PEPPER WYNDHAM is a 89 y.o. male who presents to [facility] at [facility] today for 2-wk f/u L medial malleolar fx. Pt was last seen by Dr. Lloyd on 08/30/24 advised to switch to an ankle compression sleeve  Today, pt reports L ankle is still hurts sometimes. He also notes swelling and his foot feeling hot. He states that he would like his foot physically looked at today.  Dx imaging: 08/30/24 L ankle XR 08/14/24 L tib/fib & ankle XR   Pertinent review of systems: ***  Relevant historical information: ***   Exam:  There were no vitals taken for this visit. General: Well Developed, well nourished, and in no acute distress.   MSK: ***    Lab and Radiology Results No results found for this or any previous visit (from the past 72 hours). No results found.     Assessment and Plan: 89 y.o. male with ***   PDMP not reviewed this encounter. No orders of the defined types were placed in this encounter.  No orders of the defined types were placed in this encounter.    Discussed warning signs or symptoms. Please see discharge instructions. Patient expresses understanding.   ***

## 2024-09-13 NOTE — Patient Instructions (Signed)
 Thank you for coming in today.  Work on ankle range of motion exercises  Continue compression/support  Check back in 1 month

## 2024-09-18 ENCOUNTER — Ambulatory Visit: Admitting: Podiatry

## 2024-09-18 ENCOUNTER — Encounter: Payer: Self-pay | Admitting: Podiatry

## 2024-09-18 DIAGNOSIS — M79674 Pain in right toe(s): Secondary | ICD-10-CM | POA: Diagnosis not present

## 2024-09-18 DIAGNOSIS — B351 Tinea unguium: Secondary | ICD-10-CM

## 2024-09-18 DIAGNOSIS — M79675 Pain in left toe(s): Secondary | ICD-10-CM

## 2024-09-18 NOTE — Progress Notes (Signed)
 Subjective:  Patient ID: Chad Fisher, male    DOB: December 17, 1932,  MRN: [id number]  Chad Fisher presents to clinic today for:  Chief Complaint  Patient presents with   [facility]    RFC Non diabetic toenail trim.       Patient notes nails are thick, discolored, elongated and painful in shoegear when trying to ambulate.  He stated he is recovering from a left ankle fracture.  He is supposed to be wearing a brace but states it is too uncomfortable in his regular shoes so he is not wearing any support today.  He acknowledges some residual swelling to the left ankle.  He is being treated elsewhere for this.  PCP is Katrinka Garnette KIDD, MD.  Past Medical History:  Diagnosis Date   Fisher [facility])    skin   Hernia 1987   right inguinal     Past Surgical History:  Procedure Laterality Date   BIOPSY  04/06/2023   Procedure: BIOPSY;  Surgeon: Abran Norleen SAILOR, MD;  Location: [facility];  Service: Gastroenterology;;   CATARACT EXTRACTION, BILATERAL     CHOLECYSTECTOMY N/A 12/10/2023   Procedure: LAPAROSCOPIC CHOLECYSTECTOMY;  Surgeon: Curvin Deward MOULD, MD;  Location: [facility];  Service: General;  Laterality: N/A;   COLONOSCOPY N/A 04/06/2023   Procedure: COLONOSCOPY;  Surgeon: Abran Norleen SAILOR, MD;  Location: [facility];  Service: Gastroenterology;  Laterality: N/A;   ERCP N/A 12/06/2023   Procedure: ENDOSCOPIC RETROGRADE CHOLANGIOPANCREATOGRAPHY (ERCP);  Surgeon: Abran Norleen SAILOR, MD;  Location: [facility];  Service: Gastroenterology;  Laterality: N/A;   HERNIA REPAIR     LAPAROTOMY N/A 04/08/2023   Procedure: EXPLORATORY LAPAROTOMY WITH RIGHT HEMICOLECTOMY;  Surgeon: Paola Dreama SAILOR, MD;  Location: [facility];  Service: General;  Laterality: N/A;   MOHS SURGERY     left ear   POLYPECTOMY  04/06/2023   Procedure: POLYPECTOMY;  Surgeon: Abran Norleen SAILOR, MD;  Location: [facility];  Service: Gastroenterology;;   REMOVAL OF STONES  12/06/2023   Procedure: REMOVAL OF STONES;  Surgeon: Abran Norleen SAILOR, MD;   Location: [facility];  Service: Gastroenterology;;   ANNETT  12/06/2023   Procedure: ANNETT;  Surgeon: Abran Norleen SAILOR, MD;  Location: [facility];  Service: Gastroenterology;;   SUBMUCOSAL TATTOO INJECTION  04/06/2023   Procedure: SUBMUCOSAL TATTOO INJECTION;  Surgeon: Abran Norleen SAILOR, MD;  Location: [facility];  Service: Gastroenterology;;   No Known Allergies  Review of Systems: Negative except as noted in the HPI.  Objective:  Chad Fisher is a pleasant 89 y.o. male in NAD. AAO x 3.  Vascular Examination: Capillary refill time is 3-5 seconds to toes bilateral. Palpable pedal pulses b/l LE. Digital hair present b/l.  Skin temperature gradient WNL b/l. No varicosities b/l. No cyanosis noted b/l.   Dermatological Examination: Pedal skin with normal turgor, texture and tone b/l. No open wounds. No interdigital macerations b/l. Toenails x10 are 3mm thick, discolored, dystrophic with subungual debris. There is pain with compression of the nail plates.  They are elongated x10.    Assessment/Plan: 1. Pain due to onychomycosis of toenails of both feet    The mycotic toenails were sharply debrided x10 with sterile nail nippers and a power debriding burr to decrease bulk/thickness and length.    Follow-up 3 months   Akim Watkinson DSABRA Imperial, DPM, FACFAS [facility]     [address]  [place], [place] [address]                Office [phone]  Fax [phone]

## 2024-09-19 ENCOUNTER — Inpatient Hospital Stay: Attending: Oncology | Admitting: Oncology

## 2024-09-19 ENCOUNTER — Ambulatory Visit: Payer: Self-pay | Admitting: Oncology

## 2024-09-19 ENCOUNTER — Inpatient Hospital Stay

## 2024-09-19 VITALS — BP 107/67 | HR 60 | Temp 97.8°F | Resp 18 | Ht 68.0 in | Wt 152.8 lb

## 2024-09-19 DIAGNOSIS — C189 Malignant neoplasm of colon, unspecified: Secondary | ICD-10-CM | POA: Diagnosis not present

## 2024-09-19 DIAGNOSIS — Z08 Encounter for follow-up examination after completed treatment for malignant neoplasm: Secondary | ICD-10-CM | POA: Diagnosis present

## 2024-09-19 DIAGNOSIS — Z85038 Personal history of other malignant neoplasm of large intestine: Secondary | ICD-10-CM | POA: Diagnosis present

## 2024-09-19 LAB — CEA (ACCESS): CEA (CHCC): 1.92 ng/mL (ref 0.00–5.00)

## 2024-09-19 NOTE — Telephone Encounter (Signed)
 Informed Mrs. Heidenreich that CEA remains normal. F/U as scheduled.

## 2024-09-19 NOTE — Progress Notes (Signed)
 [facility] OFFICE PROGRESS NOTE   Diagnosis: Colon cancer  INTERVAL HISTORY:   Chad Fisher returns as scheduled.  He feels well.  Good appetite.  He has a loose bowel movement and gas in the mornings.  This has been the pattern since he underwent colon surgery.  No bleeding.  He recently fell and broke his left ankle.  He was in a boot for 5 weeks.  Objective:  Vital signs in last 24 hours:  Blood pressure 107/67, pulse 60, temperature 97.8 F (36.6 C), temperature source Temporal, resp. rate 18, height 5' 8 (1.727 m), weight 152 lb 12.8 oz (69.3 kg), SpO2 97%.    Lymphatics: No cervical, supraclavicular, axillary, or inguinal nodes Resp: Coarse end inspiratory rhonchi at the left posterior base, otherwise clear, no respiratory distress Cardio: Regular rate and rhythm GI: Splenomegaly, nontender, no mass, reducible right inguinal hernia Vascular: No leg edema Musculoskeletal: Mild swelling of the left ankle  Lab Results:  Lab Results  Component Value Date   WBC 4.5 12/23/2023   HGB 13.5 12/23/2023   HCT 40.3 12/23/2023   MCV 105.4 (H) 12/23/2023   PLT 274.0 12/23/2023   NEUTROABS 2.2 12/23/2023    CMP  Lab Results  Component Value Date   NA 140 02/08/2024   K 4.7 02/08/2024   CL 104 02/08/2024   CO2 31 02/08/2024   GLUCOSE 78 02/08/2024   BUN 15 02/08/2024   CREATININE 1.14 02/08/2024   CALCIUM  8.9 02/08/2024   PROT 7.2 12/23/2023   ALBUMIN  3.7 12/23/2023   AST 45 (H) 12/23/2023   ALT 47 12/23/2023   ALKPHOS 200 (H) 12/23/2023   BILITOT 1.9 (H) 12/23/2023   GFRNONAA >60 12/12/2023   GFRAA (L) 12/10/2010    57        The eGFR has been calculated using the MDRD equation. This calculation has not been validated in all clinical situations. eGFR's persistently <60 mL/min signify possible Chronic Kidney Disease.    Lab Results  Component Value Date   CEA1 1.8 12/06/2023   CEA 2.00 03/20/2024    Lab Results  Component Value Date    INR 1.0 11/30/2023   LABPROT 10.6 11/30/2023    Imaging:  No results found.  Medications: I have reviewed the patient's current medications.   Assessment/Plan: Adenocarcinoma of the cecum, stage IIIb (pT3pN1b,cM0), status post a right colectomy 04/08/2023 Moderately differentiated adenocarcinoma, 3/24 lymph nodes, negative resection margins, no tumor deposits, no loss of mismatch repair protein expression, MSS Colonoscopy 04/07/2023-right colon mass-invasive moderately differentiated adenocarcinoma transverse colon polyp-tubular adenoma CTs 04/06/2023-cecum mass, ileocolic lymphadenopathy, 4 mm lingula nodule-nonspecific Cycle 1 Xeloda  05/17/2023, placed on hold 05/24/2023 following the morning dose due to skin toxicity and mucositis Cycle 2 Xeloda  06/08/2023, Xeloda  dose reduced to 500 mg twice daily Cycle 3 Xeloda  06/29/2023, Xeloda  500 mg twice daily Cycle 4 Xeloda  07/20/2023, 500 mg twice daily Cycle 5 Xeloda  08/10/2023, 500 mg twice daily Cycle 6 Xeloda  08/31/2023, 1000 mg every morning, 500 mg every afternoon Cycle 7 Xeloda  09/21/2023, 1000 mg in the morning, 500 mg in the afternoon Cycle 8 Xeloda  10/12/2023, 1000 mg in the morning, 500 mg in the afternoon Colon polyps-tubular adenoma on colonoscopy 04/07/2023 History of kidney stones Family history of cancer-daughter with breast cancer, son with renal cell carcinoma, brother with prostate cancer Skin toxicity and mucositis secondary to Xeloda  with cycle 1, dose reduced with cycle 2 Choledocholithiasis/cholecystitis February 2025, status post ERCP with stone retrieval 12/06/2023, cholecystectomy 12/10/2023  Disposition: Chad Fisher is in clinical colon cancer.  We will follow-up on the CEA from today.  He will return for an office visit and CEA in 6 months.  There was a 4 mm lingular nodule on the CT in June 2024.  I cannot appreciate a nodule in this area on the CT abdomen/pelvis 12/05/2023.    Arley Hof, MD  09/19/2024   10:49 AM

## 2024-10-04 ENCOUNTER — Ambulatory Visit: Admitting: Family Medicine

## 2024-10-04 ENCOUNTER — Encounter: Payer: Self-pay | Admitting: Family Medicine

## 2024-10-04 VITALS — BP 128/80 | HR 65 | Ht 68.0 in | Wt 152.0 lb

## 2024-10-04 DIAGNOSIS — S82892D Other fracture of left lower leg, subsequent encounter for closed fracture with routine healing: Secondary | ICD-10-CM

## 2024-10-04 NOTE — Progress Notes (Signed)
"       ° °  [name], PhD, LAT, ATC acting as a scribe for Artist Lloyd, MD.  Chad Fisher is a 89 y.o. male who presents to [facility] at [facility] today for f/u L medial malleolar fx. Pt was last seen by Dr. Lloyd on 09/13/24 and was advised to cont immobilization/compression.  Today, pt reports he feels like his ankle is coming along. He notes some soreness and light swelling, but feels progress.   Dx imaging: 08/30/24 L ankle XR 08/14/24 L tib/fib & ankle XR   Pertinent review of systems: No fevers or chills  Relevant historical information: Colon cancer   Exam:  BP 128/80   Pulse 65   Ht 5' 8 (1.727 m)   Wt 152 lb (68.9 kg)   SpO2 96%   BMI 23.11 kg/m  General: Well Developed, well nourished, and in no acute distress.   MSK: Left ankle some swelling is still present.  Not tender to palpation.    Lab and Radiology Results No results found for this or any previous visit (from the past 72 hours). No results found.     Assessment and Plan: 89 y.o. male with left ankle injury.  Fracture likely based on x-ray findings although difficult to be certain.  Clinically he is improving.  Continue to advance activity as tolerated.  We discussed follow-up.  He would like to check back as needed which I think is acceptable as long as he continues to improve.   PDMP not reviewed this encounter. No orders of the defined types were placed in this encounter.  No orders of the defined types were placed in this encounter.    Discussed warning signs or symptoms. Please see discharge instructions. Patient expresses understanding.   The above documentation has been reviewed and is accurate and complete Artist Lloyd, M.D.   "

## 2024-10-04 NOTE — Patient Instructions (Addendum)
Thank you for coming in today.   Check back as needed 

## 2024-10-24 ENCOUNTER — Telehealth: Payer: Self-pay | Admitting: Family Medicine

## 2024-10-24 DIAGNOSIS — G8929 Other chronic pain: Secondary | ICD-10-CM

## 2024-10-24 NOTE — Telephone Encounter (Signed)
 Continued left ankle pain and swelling after seeing Dr. Joane and just doesn't feel right- would like to get another opinion- we are referring to Dr. Kit with [facility]  Phone: [phone]  I asked him to call [facility] directly if he does not hear within a week.

## 2024-11-20 ENCOUNTER — Ambulatory Visit: Admitting: Podiatry

## 2024-12-26 ENCOUNTER — Encounter: Admitting: Family Medicine

## 2025-03-20 ENCOUNTER — Inpatient Hospital Stay: Admitting: Oncology

## 2025-03-20 ENCOUNTER — Inpatient Hospital Stay
